# Patient Record
Sex: Female | Born: 1940
Health system: Southern US, Community
[De-identification: ages and names within clinical notes are randomized; demographics above are authoritative.]

## PROBLEM LIST (undated history)

## (undated) DIAGNOSIS — F419 Anxiety disorder, unspecified: Secondary | ICD-10-CM

## (undated) DIAGNOSIS — M1990 Unspecified osteoarthritis, unspecified site: Secondary | ICD-10-CM

## (undated) DIAGNOSIS — H3530 Unspecified macular degeneration: Secondary | ICD-10-CM

## (undated) DIAGNOSIS — J302 Other seasonal allergic rhinitis: Secondary | ICD-10-CM

## (undated) DIAGNOSIS — E079 Disorder of thyroid, unspecified: Secondary | ICD-10-CM

## (undated) DIAGNOSIS — N951 Menopausal and female climacteric states: Secondary | ICD-10-CM

## (undated) DIAGNOSIS — K529 Noninfective gastroenteritis and colitis, unspecified: Secondary | ICD-10-CM

## (undated) DIAGNOSIS — I10 Essential (primary) hypertension: Secondary | ICD-10-CM

## (undated) DIAGNOSIS — N979 Female infertility, unspecified: Secondary | ICD-10-CM

## (undated) DIAGNOSIS — L719 Rosacea, unspecified: Secondary | ICD-10-CM

## (undated) DIAGNOSIS — K469 Unspecified abdominal hernia without obstruction or gangrene: Secondary | ICD-10-CM

## (undated) HISTORY — DX: Disorder of thyroid, unspecified: E07.9

## (undated) HISTORY — DX: Unspecified abdominal hernia without obstruction or gangrene: K46.9

## (undated) HISTORY — DX: Rosacea, unspecified: L71.9

## (undated) HISTORY — DX: Menopausal and female climacteric states: N95.1

## (undated) HISTORY — PX: INGUINAL HERNIA REPAIR: SUR1180

## (undated) HISTORY — DX: Unspecified macular degeneration: H35.30

## (undated) HISTORY — PX: OTHER SURGICAL HISTORY: SHX169

## (undated) HISTORY — DX: Noninfective gastroenteritis and colitis, unspecified: K52.9

## (undated) HISTORY — DX: Female infertility, unspecified: N97.9

## (undated) HISTORY — PX: COLONOSCOPY: SHX174

---

## 1966-04-27 DIAGNOSIS — E079 Disorder of thyroid, unspecified: Secondary | ICD-10-CM

## 1966-04-27 HISTORY — PX: WISDOM TOOTH EXTRACTION: SHX21

## 1966-04-27 HISTORY — DX: Disorder of thyroid, unspecified: E07.9

## 1998-09-03 ENCOUNTER — Other Ambulatory Visit: Admission: RE | Admit: 1998-09-03 | Discharge: 1998-09-03 | Payer: Self-pay | Admitting: Gastroenterology

## 1998-09-26 ENCOUNTER — Observation Stay (HOSPITAL_COMMUNITY): Admission: RE | Admit: 1998-09-26 | Discharge: 1998-09-27 | Payer: Self-pay

## 1998-09-26 HISTORY — PX: LAPAROSCOPIC CHOLECYSTECTOMY: SUR755

## 1999-02-18 ENCOUNTER — Other Ambulatory Visit: Admission: RE | Admit: 1999-02-18 | Discharge: 1999-02-18 | Payer: Self-pay | Admitting: Obstetrics and Gynecology

## 2000-08-10 ENCOUNTER — Other Ambulatory Visit: Admission: RE | Admit: 2000-08-10 | Discharge: 2000-08-10 | Payer: Self-pay | Admitting: Obstetrics and Gynecology

## 2002-03-17 ENCOUNTER — Other Ambulatory Visit: Admission: RE | Admit: 2002-03-17 | Discharge: 2002-03-17 | Payer: Self-pay | Admitting: Obstetrics and Gynecology

## 2003-04-28 HISTORY — PX: COLONOSCOPY: SHX174

## 2003-07-24 ENCOUNTER — Other Ambulatory Visit: Admission: RE | Admit: 2003-07-24 | Discharge: 2003-07-24 | Payer: Self-pay | Admitting: Obstetrics and Gynecology

## 2004-03-21 ENCOUNTER — Ambulatory Visit: Payer: Self-pay | Admitting: Gastroenterology

## 2004-04-08 ENCOUNTER — Ambulatory Visit: Payer: Self-pay | Admitting: Gastroenterology

## 2004-10-08 ENCOUNTER — Other Ambulatory Visit: Admission: RE | Admit: 2004-10-08 | Discharge: 2004-10-08 | Payer: Self-pay | Admitting: Obstetrics and Gynecology

## 2004-12-09 ENCOUNTER — Ambulatory Visit: Payer: Self-pay | Admitting: Family Medicine

## 2005-02-02 ENCOUNTER — Ambulatory Visit: Payer: Self-pay | Admitting: Family Medicine

## 2005-02-09 ENCOUNTER — Ambulatory Visit: Payer: Self-pay | Admitting: Family Medicine

## 2005-06-24 ENCOUNTER — Ambulatory Visit: Payer: Self-pay | Admitting: Internal Medicine

## 2005-07-21 ENCOUNTER — Ambulatory Visit: Payer: Self-pay

## 2005-07-23 ENCOUNTER — Ambulatory Visit: Payer: Self-pay | Admitting: Cardiology

## 2006-03-05 ENCOUNTER — Other Ambulatory Visit: Admission: RE | Admit: 2006-03-05 | Discharge: 2006-03-05 | Payer: Self-pay | Admitting: Obstetrics & Gynecology

## 2006-08-06 ENCOUNTER — Ambulatory Visit: Payer: Self-pay | Admitting: Family Medicine

## 2006-08-06 LAB — CONVERTED CEMR LAB
ALT: 17 units/L (ref 0–40)
AST: 24 units/L (ref 0–37)
Albumin: 4.1 g/dL (ref 3.5–5.2)
Alkaline Phosphatase: 61 units/L (ref 39–117)
BUN: 12 mg/dL (ref 6–23)
Basophils Absolute: 0 10*3/uL (ref 0.0–0.1)
Basophils Relative: 0.7 % (ref 0.0–1.0)
Bilirubin, Direct: 0.2 mg/dL (ref 0.0–0.3)
CO2: 30 meq/L (ref 19–32)
Calcium: 9.8 mg/dL (ref 8.4–10.5)
Chloride: 109 meq/L (ref 96–112)
Cholesterol: 259 mg/dL (ref 0–200)
Creatinine, Ser: 0.9 mg/dL (ref 0.4–1.2)
Direct LDL: 181 mg/dL
Eosinophils Absolute: 0.2 10*3/uL (ref 0.0–0.6)
Eosinophils Relative: 3 % (ref 0.0–5.0)
GFR calc Af Amer: 81 mL/min
GFR calc non Af Amer: 67 mL/min
Glucose, Bld: 79 mg/dL (ref 70–99)
HCT: 39.5 % (ref 36.0–46.0)
HDL: 47.8 mg/dL (ref >39.0)
Hemoglobin: 13.5 g/dL (ref 12.0–15.0)
Lymphocytes Relative: 29.9 % (ref 12.0–46.0)
MCHC: 34.3 g/dL (ref 30.0–36.0)
MCV: 91.5 fL (ref 78.0–100.0)
Monocytes Absolute: 0.5 10*3/uL (ref 0.2–0.7)
Monocytes Relative: 6.4 % (ref 3.0–11.0)
Neutro Abs: 4.3 10*3/uL (ref 1.4–7.7)
Neutrophils Relative %: 60 % (ref 43.0–77.0)
Platelets: 298 10*3/uL (ref 150–400)
Potassium: 4.4 meq/L (ref 3.5–5.1)
RBC: 4.32 M/uL (ref 3.87–5.11)
RDW: 12.1 % (ref 11.5–14.6)
Sodium: 145 meq/L (ref 135–145)
TSH: 1.89 microintl units/mL (ref 0.35–5.50)
Total Bilirubin: 1.1 mg/dL (ref 0.3–1.2)
Total CHOL/HDL Ratio: 5.4
Total Protein: 7.5 g/dL (ref 6.0–8.3)
Triglycerides: 163 mg/dL — ABNORMAL HIGH (ref 0–149)
VLDL: 33 mg/dL (ref 0–40)
WBC: 7.1 10*3/uL (ref 4.5–10.5)

## 2007-01-19 DIAGNOSIS — J309 Allergic rhinitis, unspecified: Secondary | ICD-10-CM | POA: Insufficient documentation

## 2007-05-24 ENCOUNTER — Other Ambulatory Visit: Admission: RE | Admit: 2007-05-24 | Discharge: 2007-05-24 | Payer: Self-pay | Admitting: Obstetrics & Gynecology

## 2007-09-06 ENCOUNTER — Telehealth (INDEPENDENT_AMBULATORY_CARE_PROVIDER_SITE_OTHER): Payer: Self-pay | Admitting: *Deleted

## 2008-04-23 ENCOUNTER — Telehealth: Payer: Self-pay | Admitting: Family Medicine

## 2009-09-25 LAB — HM DEXA SCAN

## 2009-10-14 LAB — HM PAP SMEAR: HM Pap smear: NEGATIVE

## 2009-10-17 ENCOUNTER — Encounter: Payer: Self-pay | Admitting: Family Medicine

## 2009-10-22 ENCOUNTER — Encounter: Payer: Self-pay | Admitting: *Deleted

## 2010-05-29 NOTE — Miscellaneous (Signed)
Summary: bone density    Clinical Lists Changes  Observations: Added new observation of DEXANXTDUE: 10/2014 (10/22/2009 14:44) Added new observation of BONE DENSITY: normal (10/17/2009 14:45)      Preventive Care Screening  Bone Density:    Date:  10/17/2009    Next Due:  10/2014    Results:  normal

## 2010-08-08 ENCOUNTER — Telehealth: Payer: Self-pay | Admitting: Family Medicine

## 2010-08-08 NOTE — Telephone Encounter (Signed)
Pt called and is req to sch emp. Pt says that she had a pap done last fall 2011 by gynecologist and they also did complete blood panel, which should have been sent to Dr Tawanna Cooler for review. Pt is wondering if she still needs to come in for fasting emp? Pls advise.

## 2010-08-11 NOTE — Telephone Encounter (Signed)
Recommend annual physical examination

## 2010-08-13 NOTE — Telephone Encounter (Signed)
Left message on machine for patient

## 2011-11-26 HISTORY — PX: COLONOSCOPY: SHX174

## 2011-11-30 ENCOUNTER — Other Ambulatory Visit: Payer: Self-pay | Admitting: Gastroenterology

## 2012-10-31 ENCOUNTER — Ambulatory Visit (INDEPENDENT_AMBULATORY_CARE_PROVIDER_SITE_OTHER): Payer: 59 | Admitting: Neurology

## 2012-10-31 ENCOUNTER — Ambulatory Visit (INDEPENDENT_AMBULATORY_CARE_PROVIDER_SITE_OTHER): Payer: 59

## 2012-10-31 DIAGNOSIS — R209 Unspecified disturbances of skin sensation: Secondary | ICD-10-CM

## 2012-10-31 DIAGNOSIS — Z0289 Encounter for other administrative examinations: Secondary | ICD-10-CM

## 2012-10-31 NOTE — Procedures (Signed)
  HISTORY:  Cheryl Pearson is a 72 year old patient with a 2-1/2 year history of relatively sudden onset painless right hand numbness and weakness. Over 6 months, the patient regained most of her strength back with the right hand, but the numbness has been somewhat more persistent. The patient is having some discomfort at this point with use of the hand. The patient is sent for an evaluation of a possible neuropathy or a cervical radiculopathy.  NERVE CONDUCTION STUDIES:  Nerve conduction studies were performed on both upper extremities. The distal motor latencies and motor amplitudes for the median and ulnar nerves were within normal limits. The F wave latencies and nerve conduction velocities for these nerves were also normal. The sensory latencies for the median and ulnar nerves were normal.   EMG STUDIES:  EMG study was performed on the right upper extremity:  The first dorsal interosseous muscle reveals 2 to 4 K units with full recruitment. No fibrillations or positive waves were noted. The abductor pollicis brevis muscle reveals 2 to 4 K units with full recruitment. No fibrillations or positive waves were noted. The extensor indicis proprius muscle reveals 1 to 3 K units with full recruitment. No fibrillations or positive waves were noted. The pronator teres muscle reveals 2 to 3 K units with full recruitment. No fibrillations or positive waves were noted. The biceps muscle reveals 1 to 2 K units with full recruitment. No fibrillations or positive waves were noted. The triceps muscle reveals 2 to 4 K units with full recruitment. No fibrillations or positive waves were noted. The anterior deltoid muscle reveals 2 to 3 K units with full recruitment. No fibrillations or positive waves were noted. The cervical paraspinal muscles were tested at 2 levels. No abnormalities of insertional activity were seen at either level tested. There was good relaxation.   IMPRESSION:  Nerve conduction studies  done on both upper extremities were within normal limits. No evidence of carpal tunnel syndrome or evidence of an ulnar neuropathy is seen on either side. EMG evaluation of the right upper extremity was normal. There is no evidence of a cervical radiculopathy on this evaluation.  Marlan Palau MD 10/31/2012 12:51 PM  [facility] [address] [place], [place] [address]  Phone [phone] Fax [phone]

## 2012-11-18 ENCOUNTER — Encounter: Payer: Self-pay | Admitting: *Deleted

## 2012-11-22 ENCOUNTER — Encounter: Payer: Self-pay | Admitting: Nurse Practitioner

## 2012-11-22 ENCOUNTER — Ambulatory Visit (INDEPENDENT_AMBULATORY_CARE_PROVIDER_SITE_OTHER): Payer: 59 | Admitting: Nurse Practitioner

## 2012-11-22 VITALS — BP 134/62 | HR 74 | Resp 14 | Ht 65.25 in | Wt 155.8 lb

## 2012-11-22 DIAGNOSIS — E559 Vitamin D deficiency, unspecified: Secondary | ICD-10-CM

## 2012-11-22 DIAGNOSIS — Z01419 Encounter for gynecological examination (general) (routine) without abnormal findings: Secondary | ICD-10-CM

## 2012-11-22 MED ORDER — ESTROGENS, CONJUGATED 0.625 MG/GM VA CREA: TOPICAL_CREAM | VAGINAL | Status: DC

## 2012-11-22 NOTE — Progress Notes (Signed)
72 y.o. G0P0 Married Caucasian Fe here for annual exam.  Off  Estring Ring this past year secondary to cost.  Still with vagina dryness and would like to try estrogen cream.  No LMP recorded. Patient is postmenopausal.          Sexually active: yes  The current method of family planning is post menopausal status.    Exercising: yes  classes at the [facility] Smoker:  no  Health Maintenance: Pap:  10/14/2009  negative MMG:  12/09/2011 normal Colonoscopy:  11/2011 polyp recheck in 5 years BMD:   09/25/2009  T Score:  Spine: 1.4/ left femoral neck -0.4/ left total -0.2 TDaP:  01/2003- PCP maintains tetanus Shingles has been given ? date Labs: PCP maintains all labs and urine.    reports that she has never smoked. She has never used smokeless tobacco. She reports that she drinks about 0.5 ounces of alcohol per week. She reports that she does not use illicit drugs.  Past Medical History  Diagnosis Date  . Menopausal symptoms   . Infertility, female   . Thyroid disease     hypothyroidism  . Rosacea   . Early age-related macular degeneration   . Hernia   . Colitis     Past Surgical History  Procedure Laterality Date  . Colonoscopy      Current Outpatient Prescriptions  Medication Sig Dispense Refill  . ezetimibe (ZETIA) 10 MG tablet Take 10 mg by mouth daily.      . Lutein 6 MG CAPS Take by mouth daily.      Marland Kitchen tetrahydrozoline-zinc (EYE DROPS AR) 0.05-0.25 % ophthalmic solution 2 drops 3 (three) times daily as needed.      . Vitamin D, Ergocalciferol, (DRISDOL) 50000 UNITS CAPS Take 50,000 Units by mouth every 7 (seven) days.       No current facility-administered medications for this visit.    Family History  Problem Relation Age of Onset  . Cancer Mother     uterine cancer  . Cancer Maternal Aunt     colon cancer    ROS:  Pertinent items are noted in HPI.  Otherwise, a comprehensive ROS was negative.  Exam:   BP 134/62  Pulse 74  Resp 14  Ht 5' 5.25" (1.657 m)  Wt 155 lb  12.8 oz (70.67 kg)  BMI 25.74 kg/m2 Height: 5' 5.25" (165.7 cm)  Ht Readings from Last 3 Encounters:  11/22/12 5' 5.25" (1.657 m)    General appearance: alert, cooperative and appears stated age Head: Normocephalic, without obvious abnormality, atraumatic Neck: no adenopathy, supple, symmetrical, trachea midline and thyroid normal to inspection and palpation Lungs: clear to auscultation bilaterally Breasts: normal appearance, no masses or tenderness Heart: regular rate and rhythm Abdomen: soft, non-tender; no masses,  no organomegaly Extremities: extremities normal, atraumatic, no cyanosis or edema Skin: Skin color, texture, turgor normal. No rashes or lesions Lymph nodes: Cervical, supraclavicular, and axillary nodes normal. No abnormal inguinal nodes palpated Neurologic: Grossly normal   Pelvic: External genitalia:  no lesions              Urethra:  prolapse appearing urethra with no masses, tenderness or lesions              Bartholin's and Skene's: normal                 Vagina: atrophic appearing vagina with pale color and no discharge, no lesions  Cervix: anteverted              Pap taken: no Bimanual Exam:  Uterus:  normal size, contour, position, consistency, mobility, non-tender              Adnexa: no mass, fullness, tenderness               Rectovaginal: Confirms               Anus:  normal sphincter tone, no lesions  A:  Well Woman with normal exam  Postmenopausal off HRT 06/2004  Atrophic Vaginitis off Estring X 1 year secondary to cost  P:   Pap smear as per guidelines   Mammogram due 11/2012  Trial of Premarin vaginal cream 0.5 mg twice weekly and apply 'pea' size to the urethra. RX to pharmacy  Counseled with risk of ERT with DVT, CVA, cancer, etc. return annually or prn  An After Visit Summary was printed and given to the patient.

## 2012-11-22 NOTE — Patient Instructions (Addendum)

## 2012-11-23 LAB — VITAMIN D 25 HYDROXY (VIT D DEFICIENCY, FRACTURES): Vit D, 25-Hydroxy: 46 ng/mL (ref 30–89)

## 2012-11-23 NOTE — Progress Notes (Signed)
Encounter reviewed by Dr. Brook Silva.  

## 2012-11-24 ENCOUNTER — Ambulatory Visit (INDEPENDENT_AMBULATORY_CARE_PROVIDER_SITE_OTHER): Payer: 59 | Admitting: Family Medicine

## 2012-11-24 ENCOUNTER — Encounter: Payer: Self-pay | Admitting: Family Medicine

## 2012-11-24 DIAGNOSIS — T6391XA Toxic effect of contact with unspecified venomous animal, accidental (unintentional), initial encounter: Secondary | ICD-10-CM

## 2012-11-24 MED ORDER — PREDNISONE 20 MG PO TABS: ORAL_TABLET | ORAL | Status: DC

## 2012-11-24 MED ORDER — EPINEPHRINE 0.3 MG/0.3ML IJ SOAJ: 0.3000 mg | Freq: Once | INTRAMUSCULAR | Status: DC

## 2012-11-24 NOTE — Progress Notes (Signed)
[address]   [place], [place]  [address]   [phone]  Subjective:    Patient ID: Cheryl Pearson, female    DOB: [birth year], 72 y.o.   MRN: [id number]  HPI This 72 y.o. female presents for evaluation of bee stings one week ago.  Stepped on yellow jacket nest one week ago.  Multiple stings.  Improved initially with Benadryl and hydrocortisone.  Now worsening with increasing bumps, redness.  Anterior neck hurt and itch; the others really itch at night.  Diffuse.  No facial swelling, tongue swelling, SOB.   No fever/chills/sweats.  No malaise or fatigue.   Review of Systems  Constitutional: Negative for fever, chills, diaphoresis and fatigue.  HENT: Negative for facial swelling.   Respiratory: Negative for cough and shortness of breath.   Gastrointestinal: Negative for nausea and vomiting.  Skin: Positive for color change and rash. Negative for pallor and wound.   Past Medical History  Diagnosis Date  . Menopausal symptoms   . Infertility, female   . Rosacea   . Early age-related macular degeneration   . Hernia   . Colitis   . Thyroid disease 1968    hypothyroidism treated with meds for short time then all labs normal   Current Outpatient Prescriptions on File Prior to Visit  Medication Sig Dispense Refill  . cholecalciferol (VITAMIN D) 1000 UNITS tablet Take 1,000 Units by mouth daily. Taking 2,000 IU daily      . conjugated estrogens (PREMARIN) vaginal cream Place vaginally 2 (two) times a week.  42.5 g  12  . ezetimibe (ZETIA) 10 MG tablet Take 10 mg by mouth daily.      . Lutein 6 MG CAPS Take by mouth daily.      Marland Kitchen tetrahydrozoline-zinc (EYE DROPS AR) 0.05-0.25 % ophthalmic solution 2 drops 3 (three) times daily as needed.       No current facility-administered medications on file prior to visit.   History   Social History  . Marital Status: Married    Spouse Name: N/A    Number of Children: N/A  . Years of Education: N/A   Occupational History  . Not on file.    Social History Main Topics  . Smoking status: Never Smoker   . Smokeless tobacco: Never Used  . Alcohol Use: 0.5 oz/week    1 drink(s) per week  . Drug Use: No  . Sexually Active: Yes    Birth Control/ Protection: Post-menopausal   Other Topics Concern  . Not on file   Social History Narrative  . No narrative on file       Objective:   Physical Exam  Nursing note and vitals reviewed. Constitutional: She is oriented to person, place, and time. She appears well-developed and well-nourished. No distress.  HENT:  Head: Normocephalic and atraumatic.  Mouth/Throat: Oropharynx is clear and moist.  Neck: Normal range of motion. Neck supple.  Cardiovascular: Normal rate, regular rhythm and normal heart sounds.   Pulmonary/Chest: Effort normal and breath sounds normal.  Lymphadenopathy:    She has no cervical adenopathy.  Neurological: She is alert and oriented to person, place, and time.  Skin: She is not diaphoretic.  SCATTERED INSECT BITES WITH AREA OF CLEARING SURROUNDED BY ERYTHEMA; NO INDURATION; NO VESICLES OR PUSTULES.  BITES SCATTERED ALONG TORSO, NECK, EXTREMITIES THROUGHOUT.  Psychiatric: She has a normal mood and affect. Her behavior is normal.       Assessment & Plan:  Bee sting reaction, initial encounter  1. Bee sting allergic reaction initial:  New.  Rx for Prednisone, epi pen provided.  Recommend Zyrtec 10mg  daily, Benadryl 25mg  qhs, Zyrtec 10mg  qhs, Ranitidine 150mg  bid.  RTC immediately for fever/chills/sweats, SOB, facial swelling.  Tetanus UTD per patient  Meds ordered this encounter  Medications  . diphenhydrAMINE (SOMINEX) 25 MG tablet    Sig: Take 25 mg by mouth at bedtime as needed for sleep.  . hydrocortisone cream 0.5 %    Sig: Apply topically 2 (two) times daily.  . predniSONE (DELTASONE) 20 MG tablet    Sig: 3 tablets daily x 1 day then two tablets daily x 5 days then one tablet daily x 5 days    Dispense:  18 tablet    Refill:  0  .  EPINEPHrine (EPIPEN 2-PAK) 0.3 mg/0.3 mL SOAJ    Sig: Inject 0.3 mLs (0.3 mg total) into the muscle once.    Dispense:  1 Device    Refill:  1

## 2012-11-24 NOTE — Patient Instructions (Addendum)
1.  CLARITIN OR ZYRTEC 10MG  ONE TABLET DAILY. 2.  BENADRYL 25MG  ONE AT BEDTIME. 3.  ZANTAC 150MG  ONE TABLET TWICE DAILY. 4.  TAKE PREDNISONE AS PRESCRIBED.

## 2012-11-25 ENCOUNTER — Telehealth: Payer: Self-pay | Admitting: *Deleted

## 2012-11-25 NOTE — Telephone Encounter (Signed)
LVM for pt to return my call in regards to lab results.  

## 2012-11-25 NOTE — Telephone Encounter (Signed)
Message copied by Osie Bond on Fri Nov 25, 2012 10:01 AM ------      Message from: Ria Comment R      Created: Thu Nov 24, 2012  5:13 PM       Vit D is normal follow protocol. ------

## 2012-11-28 NOTE — Telephone Encounter (Signed)
Patient notified of Vitamin D results and protocol to follow of OTC vitamin D 600- 800 IU daily. Patient understands directions and glad her level has increased.

## 2012-11-28 NOTE — Telephone Encounter (Signed)
Patient returning phone call .Maybe In regards to  Blood test results.

## 2013-03-02 ENCOUNTER — Other Ambulatory Visit: Payer: Self-pay

## 2013-10-13 ENCOUNTER — Other Ambulatory Visit: Payer: Self-pay | Admitting: Geriatric Medicine

## 2013-10-13 DIAGNOSIS — R1310 Dysphagia, unspecified: Secondary | ICD-10-CM

## 2013-10-18 ENCOUNTER — Ambulatory Visit
Admission: RE | Admit: 2013-10-18 | Discharge: 2013-10-18 | Disposition: A | Discharge status: Home / Self Care | Payer: Medicare Other | Source: Ambulatory Visit | Attending: Geriatric Medicine | Admitting: Geriatric Medicine

## 2013-10-18 DIAGNOSIS — R1310 Dysphagia, unspecified: Secondary | ICD-10-CM

## 2014-03-14 ENCOUNTER — Encounter: Payer: Self-pay | Admitting: *Deleted

## 2014-10-04 ENCOUNTER — Telehealth: Payer: Self-pay | Admitting: Nurse Practitioner

## 2014-10-04 NOTE — Telephone Encounter (Signed)
Patient states she needs records on bone density test and mammogram. Patient ok for call back

## 2014-10-05 NOTE — Telephone Encounter (Signed)
Message left to return call to Olinda at [phone].   Records in hard copy chart, Last Dexa 10/17/09 at [place]. Last Mammogram 11/29/11 at [facility]. Patient will need to come to office and sign release for copies at front desk.

## 2014-10-05 NOTE — Telephone Encounter (Signed)
I think we can hold this until Monday for Patty

## 2014-10-05 NOTE — Telephone Encounter (Signed)
Debbi,   Alexia Freestone was the ordering provider for the hard copy we have here with cc to Dr. Tawanna Cooler

## 2014-10-05 NOTE — Telephone Encounter (Signed)
The scanned record shows to Dr Tawanna Cooler did he order the last one? If so he should order??

## 2014-10-05 NOTE — Telephone Encounter (Signed)
Call to patient and advised of last dates of exam per patient request.  Patient requests order for Bone Density Testing.  Last Bone Density at [facility] 10/17/09.  Last annual exam with Lauro Franklin, FNP 10/2012.  Patient declines to schedule annual exam at this time, she would like to get screenings done if possible to do both together and then return call to scheduled. Advised I would ask provider if she would be willing to place order. Patient would like a message on her phone to advise.

## 2014-10-09 NOTE — Telephone Encounter (Signed)
Yes she can have order for BMD

## 2014-10-09 NOTE — Telephone Encounter (Signed)
Cheryl Pearson can you review?

## 2014-10-10 NOTE — Telephone Encounter (Signed)
Order faxed to [facility].  Patient notified. Advised to call to schedule. Patient agreeable.   Routing to provider for final review. Patient agreeable to disposition. Will close encounter.

## 2014-10-26 ENCOUNTER — Ambulatory Visit
Admission: RE | Admit: 2014-10-26 | Discharge: 2014-10-26 | Disposition: A | Discharge status: Home / Self Care | Payer: Medicare Other | Source: Ambulatory Visit | Attending: Geriatric Medicine | Admitting: Geriatric Medicine

## 2014-10-26 ENCOUNTER — Other Ambulatory Visit: Payer: Self-pay | Admitting: Geriatric Medicine

## 2014-10-26 DIAGNOSIS — R05 Cough: Secondary | ICD-10-CM

## 2014-10-26 DIAGNOSIS — R059 Cough, unspecified: Secondary | ICD-10-CM

## 2014-10-26 DIAGNOSIS — M25552 Pain in left hip: Secondary | ICD-10-CM

## 2014-10-26 DIAGNOSIS — M545 Low back pain: Secondary | ICD-10-CM

## 2014-10-30 ENCOUNTER — Telehealth: Payer: Self-pay | Admitting: Nurse Practitioner

## 2014-10-30 NOTE — Telephone Encounter (Addendum)
Please let pt. know that BMD done on 10/19/14 shows a  T Score: spine 1.6; left hip neck -0.7; right hip neck -0.9.   The right hip result fall into the low normal range.  The FRAX score for major fracture in 10 years is 13.9% (normal <20%); FRAX score for hip fracture in 10 years is 4.2% (normal <3%).  The  FRAX score looks higher than expected [facility].  She still needs to continue calcium, Vit D and exercise.

## 2014-11-01 NOTE — Telephone Encounter (Signed)
Left message to call Shayda Kalka at [phone]. 

## 2014-11-05 NOTE — Telephone Encounter (Signed)
Left message to call Cheryl Pearson at [phone]. 

## 2014-11-06 NOTE — Telephone Encounter (Signed)
Spoke with patient. Advised of results as seen below. Patient is agreeable and verbalizes understanding.  Routing to provider for final review. Patient agreeable to disposition. Will close encounter.   Patient aware provider will review message and nurse will return call if any additional advice or change of disposition.

## 2014-11-14 ENCOUNTER — Encounter (HOSPITAL_COMMUNITY): Payer: Self-pay | Admitting: *Deleted

## 2014-11-19 ENCOUNTER — Other Ambulatory Visit: Payer: Self-pay | Admitting: Gastroenterology

## 2014-11-19 NOTE — Addendum Note (Signed)
Addended by: Samuel Germany. on: 11/19/2014 11:13 AM   Modules accepted: Orders

## 2014-11-20 ENCOUNTER — Ambulatory Visit (HOSPITAL_COMMUNITY): Payer: Medicare Other | Admitting: Anesthesiology

## 2014-11-20 ENCOUNTER — Encounter (HOSPITAL_COMMUNITY): Payer: Self-pay | Admitting: *Deleted

## 2014-11-20 ENCOUNTER — Ambulatory Visit (HOSPITAL_COMMUNITY): Payer: Medicare Other

## 2014-11-20 ENCOUNTER — Ambulatory Visit (HOSPITAL_COMMUNITY)
Admission: RE | Admit: 2014-11-20 | Discharge: 2014-11-20 | Disposition: A | Discharge status: Home / Self Care | Payer: Medicare Other | Source: Ambulatory Visit | Attending: Gastroenterology | Admitting: Gastroenterology

## 2014-11-20 ENCOUNTER — Encounter (HOSPITAL_COMMUNITY)
Admission: RE | Disposition: A | Discharge status: Home / Self Care | Payer: Self-pay | Source: Ambulatory Visit | Attending: Gastroenterology

## 2014-11-20 DIAGNOSIS — Z791 Long term (current) use of non-steroidal anti-inflammatories (NSAID): Secondary | ICD-10-CM | POA: Diagnosis not present

## 2014-11-20 DIAGNOSIS — Z9049 Acquired absence of other specified parts of digestive tract: Secondary | ICD-10-CM | POA: Diagnosis not present

## 2014-11-20 DIAGNOSIS — H3530 Unspecified macular degeneration: Secondary | ICD-10-CM | POA: Insufficient documentation

## 2014-11-20 DIAGNOSIS — K219 Gastro-esophageal reflux disease without esophagitis: Secondary | ICD-10-CM | POA: Insufficient documentation

## 2014-11-20 DIAGNOSIS — Z79899 Other long term (current) drug therapy: Secondary | ICD-10-CM | POA: Insufficient documentation

## 2014-11-20 DIAGNOSIS — K222 Esophageal obstruction: Secondary | ICD-10-CM | POA: Insufficient documentation

## 2014-11-20 DIAGNOSIS — R1310 Dysphagia, unspecified: Secondary | ICD-10-CM | POA: Diagnosis present

## 2014-11-20 HISTORY — PX: SAVORY DILATION: SHX5439

## 2014-11-20 HISTORY — PX: ESOPHAGOGASTRODUODENOSCOPY (EGD) WITH PROPOFOL: SHX5813

## 2014-11-20 SURGERY — ESOPHAGOGASTRODUODENOSCOPY (EGD) WITH PROPOFOL
Anesthesia: Monitor Anesthesia Care

## 2014-11-20 MED ORDER — PROPOFOL INFUSION 10 MG/ML OPTIME
INTRAVENOUS | Status: DC | PRN
  Administered 2014-11-20: 140 ug/kg/min via INTRAVENOUS

## 2014-11-20 MED ORDER — LIDOCAINE HCL (CARDIAC) 20 MG/ML IV SOLN
INTRAVENOUS | Status: DC | PRN
  Administered 2014-11-20: 100 mg via INTRAVENOUS

## 2014-11-20 MED ORDER — LACTATED RINGERS IV SOLN
INTRAVENOUS | Status: DC
  Administered 2014-11-20: 1000 mL via INTRAVENOUS

## 2014-11-20 MED ORDER — PROPOFOL 10 MG/ML IV BOLUS
INTRAVENOUS | Status: AC
Start: 2014-11-20 — End: 2014-11-20
  Filled 2014-11-20: qty 20

## 2014-11-20 MED ORDER — PROPOFOL 10 MG/ML IV BOLUS
INTRAVENOUS | Status: DC | PRN
  Administered 2014-11-20 (×3): 20 mg via INTRAVENOUS

## 2014-11-20 MED ORDER — SODIUM CHLORIDE 0.9 % IV SOLN: INTRAVENOUS | Status: DC

## 2014-11-20 MED ORDER — PROPOFOL 10 MG/ML IV BOLUS
INTRAVENOUS | Status: AC
  Filled 2014-11-20: qty 20

## 2014-11-20 MED ORDER — LIDOCAINE HCL (CARDIAC) 20 MG/ML IV SOLN
INTRAVENOUS | Status: AC
  Filled 2014-11-20: qty 5

## 2014-11-20 SURGICAL SUPPLY — 14 items

## 2014-11-20 NOTE — H&P (Signed)
Subjective:   Patient is a 74 y.o. female presents with dysphagia. Barium swallow showed smooth narrowing in the proximal esophagus had the appearance of 9 straight with no associated masses. Patient has had problems swallowing pills and other solids. She's had trouble swallowing large pieces of food.. Procedure including risks and benefits discussed in office.  Patient Active Problem List   Diagnosis Date Noted  . ALLERGIC RHINITIS 01/19/2007   Past Medical History  Diagnosis Date  . Menopausal symptoms   . Infertility, female   . Rosacea   . Early age-related macular degeneration   . Hernia   . Colitis   . Thyroid disease 1968    hypothyroidism treated with meds for short time then all labs normal  . GERD (gastroesophageal reflux disease)     Past Surgical History  Procedure Laterality Date  . Colonoscopy  11/2011    polyp, ulcerative coliits inactive  . Laparoscopic cholecystectomy  09/1998  . Colonoscopy  2005  . Colonoscopy  1970's    diagnosed with ulcerative colitis  . Wisdom tooth extraction  1968  . Inguinal hernia repair Right 20 mos old    Prescriptions prior to admission  Medication Sig Dispense Refill Last Dose  . cholecalciferol (VITAMIN D) 1000 UNITS tablet Take 2,000 Units by mouth daily. Taking 2,000 IU daily   11/18/2014  . ezetimibe (ZETIA) 10 MG tablet Take 10 mg by mouth daily.   11/20/2014  . Lutein 6 MG CAPS Take 1 capsule by mouth daily.    11/18/2014  . meloxicam (MOBIC) 15 MG tablet Take 15 mg by mouth daily.   11/17/2014  . Multiple Vitamins-Minerals (PRESERVISION AREDS) CAPS Take 1 capsule by mouth daily.    Past Month at Unknown time  . conjugated estrogens (PREMARIN) vaginal cream Place vaginally 2 (two) times a week. (Patient not taking: Reported on 11/13/2014) 42.5 g 12 Taking  . EPINEPHrine (EPIPEN 2-PAK) 0.3 mg/0.3 mL SOAJ Inject 0.3 mLs (0.3 mg total) into the muscle once. 1 Device 1 More than a month at Unknown time  . predniSONE (DELTASONE) 20  MG tablet 3 tablets daily x 1 day then two tablets daily x 5 days then one tablet daily x 5 days (Patient not taking: Reported on 11/13/2014) 18 tablet 0 Taking   Allergies  Allergen Reactions  . Yellow Jacket Venom [Bee Venom] Anaphylaxis  . Lipitor [Atorvastatin]     Joint pain.  . Statins     aches  . Zocor [Simvastatin]     aches  . Crestor [Rosuvastatin]     aches  . Welchol [Colesevelam Hcl]     aches    History  Substance Use Topics  . Smoking status: Never Smoker   . Smokeless tobacco: Never Used  . Alcohol Use: 0.5 oz/week    1 Standard drinks or equivalent per week     Comment: occasionaly    Family History  Problem Relation Age of Onset  . Cancer Mother     uterine cancer  . Osteoarthritis Mother   . Rheum arthritis Mother   . COPD Mother   . Cancer Maternal Aunt     colon cancer  . COPD Father   . COPD Brother      Objective:   Patient Vitals for the past 8 hrs:  BP Temp Temp src Pulse Resp SpO2 Height Weight  11/20/14 1216 (!) 176/69 mmHg 98 F (36.7 C) Oral 90 15 96 %  (1.651 m) 72.576 kg (160 lb)  See MD Preop evaluation      Assessment:   1. Dysphagia with barium swallow showing smooth proximal esophageal stricture  Plan:   Will proceed with EGD and dilatation using fluoroscopic guidance. Procedure has been discussed in detail in the office.

## 2014-11-20 NOTE — Discharge Instructions (Addendum)
Conscious Sedation, Adult, Care After Refer to this sheet in the next few weeks. These instructions provide you with information on caring for yourself after your procedure. Your health care provider may also give you more specific instructions. Your treatment has been planned according to current medical practices, but problems sometimes occur. Call your health care provider if you have any problems or questions after your procedure. WHAT TO EXPECT AFTER THE PROCEDURE  After your procedure:  You may feel sleepy, clumsy, and have poor balance for several hours.  Vomiting may occur if you eat too soon after the procedure. HOME CARE INSTRUCTIONS  Do not participate in any activities where you could become injured for at least 24 hours. Do not:  Drive.  Swim.  Ride a bicycle.  Operate heavy machinery.  Cook.  Use power tools.  Climb ladders.  Work from a high place.  Do not make important decisions or sign legal documents until you are improved.  If you vomit, drink water, juice, or soup when you can drink without vomiting. Make sure you have little or no nausea before eating solid foods.  Only take over-the-counter or prescription medicines for pain, discomfort, or fever as directed by your health care provider.  Make sure you and your family fully understand everything about the medicines given to you, including what side effects may occur.  You should not drink alcohol, take sleeping pills, or take medicines that cause drowsiness for at least 24 hours.  If you smoke, do not smoke without supervision.  If you are feeling better, you may resume normal activities 24 hours after you were sedated.  Keep all appointments with your health care provider. SEEK MEDICAL CARE IF:  Your skin is pale or bluish in color.  You continue to feel nauseous or vomit.  Your pain is getting worse and is not helped by medicine.  You have bleeding or swelling.  You are still sleepy or  feeling clumsy after 24 hours. SEEK IMMEDIATE MEDICAL CARE IF:  You develop a rash.  You have difficulty breathing.  You develop any type of allergic problem.  You have a fever. MAKE SURE YOU:  Understand these instructions.  Will watch your condition.  Will get help right away if you are not doing well or get worse. Document Released: 02/01/2013 Document Reviewed: 02/01/2013 [facility] Patient Information 2015 [place], [place]. This information is not intended to replace advice given to you by your health care provider. Make sure you discuss any questions you have with your health care provider. Continue current meds. Clear liquids for 4-6 hours, if no chest pain or trouble breathing, soft foods tonight.  Regular diet tomorrow. Call for problems.

## 2014-11-20 NOTE — Op Note (Signed)
[facility] [address] [place], [address]   ENDOSCOPY PROCEDURE REPORT  PATIENT: Cheryl, Pearson  MR#: [id number] BIRTHDATE: 1940/10/06 , 73  yrs. old GENDER: female  ENDOSCOPIST: Carman Ching, MD, MD REFFERED Denice Paradise, M.D.  PROCEDURE DATE:  22-Nov-2014 PROCEDURE:   EGD w/ wire guided (savary) dilation  INDICATIONS:1.  patient with dysphagia and barium swallow showing smooth stricture in the proximal esophagus. MEDICATIONS: Propofol 180 mg IV TOPICAL ANESTHETIC:  DESCRIPTION OF PROCEDURE:   After the risks benefits and alternatives of the procedure were thoroughly explained, informed consent was obtained.  The Pentax Gastroscope [id number]  endoscope was introduced through the mouth and advanced to the second portion of the duodenum. The instrument was slowly withdrawn as the mucosa was carefully examined.  Prior to withdrawal of the scope, the guidwire was placed.  The esophagus was dilated successfully.  The patient was recovered in endoscopy and discharged home in satisfactory condition. Estimated blood loss is zero unless otherwise noted in this procedure report.      ESOPHAGUS: There was a short stricture in the proximal esophagus. The stricture was not traversable.  The stricture was dilated using a 11mm (33Fr) savary dilator over guidewire.  The stricture was dilated using a 12mm (36Fr) savary dilator over guidewire.  The stricture was dilated using a 12.84mm (38Fr) savary dilator over guidewire.  The stricture was dilated using a 14mm (42Fr) savary dilator over guidewire.  Following this dilation, there was a small amount of heme.  STOMACH: Normal.  DUODENUM: Normal. we originally use the adult scope and then change to the pediatric endoscope in the stricture was easily passed. Dilation was then performed at the  Dilator: Savary over guidewire Size(s): 11 mm, 12 mm, 12.8 mm, 14 mm  COMPLICATIONS: There were no immediate  complications.  ENDOSCOPIC IMPRESSION: 1.   There was a short stricture in the proximal esophagus; The stricture was dilated using a 11mm (33Fr) savary dilator over guidewire.; The stricture was dilated using a 12mm (36Fr) savary dilator over guidewire.; The stricture was dilated using a 12.27mm (38Fr) savary dilator over guidewire.; The stricture was dilated using a 14mm (42Fr) savary dilator over guidewire.; Following this dilation, there was a small amount of heme 2.   Normal stomach 3.   Normal duodenum RECOMMENDATIONS: Routine post dilatation instructions follow up in the office in 3 weeks     _______________________________ eSigned:  Carman Ching, MD 22-Nov-2014 2:14 PM   CPT CODES: ICD CODES:  The ICD and CPT codes recommended by this software are interpretations from the data that the clinical staff has captured with the software.  The verification of the translation of this report to the ICD and CPT codes and modifiers is the sole responsibility of the health care institution and practicing physician where this report was generated.  [employer] will not be held responsible for the validity of the ICD and CPT codes included on this report.  AMA assumes no liability for data contained or not contained herein. CPT is a Publishing rights manager of the [employer].    PATIENT NAME:  Cheryl, Pearson MR#: [id number]

## 2014-11-20 NOTE — Anesthesia Preprocedure Evaluation (Addendum)
Anesthesia Evaluation  Patient identified by MRN, date of birth, ID band Patient awake    Reviewed: Allergy & Precautions, NPO status , Patient's Chart, lab work & pertinent test results  History of Anesthesia Complications Negative for: history of anesthetic complications  Airway Mallampati: II  TM Distance: >3 FB Neck ROM: Full    Dental  (+) Caps, Implants, Dental Advisory Given   Pulmonary neg pulmonary ROS,  breath sounds clear to auscultation        Cardiovascular negative cardio ROS  Rhythm:Regular Rate:Normal     Neuro/Psych negative neurological ROS     GI/Hepatic Neg liver ROS, GERD-  Medicated and Controlled,Esophageal stricture   Endo/Other  negative endocrine ROS  Renal/GU negative Renal ROS     Musculoskeletal   Abdominal   Peds  Hematology negative hematology ROS (+)   Anesthesia Other Findings   Reproductive/Obstetrics                            Anesthesia Physical Anesthesia Plan  ASA: II  Anesthesia Plan: MAC   Post-op Pain Management:    Induction:   Airway Management Planned: Natural Airway and Nasal Cannula  Additional Equipment:   Intra-op Plan:   Post-operative Plan:   Informed Consent: I have reviewed the patients History and Physical, chart, labs and discussed the procedure including the risks, benefits and alternatives for the proposed anesthesia with the patient or authorized representative who has indicated his/her understanding and acceptance.   Dental advisory given  Plan Discussed with: CRNA and Surgeon  Anesthesia Plan Comments: (Plan routine monitors, MAC)        Anesthesia Quick Evaluation

## 2014-11-20 NOTE — Anesthesia Postprocedure Evaluation (Signed)
  Anesthesia Post-op Note  Patient: Cheryl Pearson  Procedure(s) Performed: Procedure(s): ESOPHAGOGASTRODUODENOSCOPY (EGD) WITH PROPOFOL (N/A) SAVORY DILATION (N/A)  Patient Location: Endoscopy Unit  Anesthesia Type:MAC  Level of Consciousness: awake, alert , oriented and patient cooperative  Airway and Oxygen Therapy: Patient Spontanous Breathing  Post-op Pain: none  Post-op Assessment: Post-op Vital signs reviewed, Patient's Cardiovascular Status Stable, Respiratory Function Stable, Patent Airway, No signs of Nausea or vomiting and Pain level controlled              Post-op Vital Signs: Reviewed and stable  Last Vitals:  Filed Vitals:   11/20/14 1414  BP: 147/62  Pulse:   Temp: 36.7 C  Resp: 18    Complications: No apparent anesthesia complications

## 2014-11-20 NOTE — Transfer of Care (Signed)
Immediate Anesthesia Transfer of Care Note  Patient: Cheryl Pearson  Procedure(s) Performed: Procedure(s): ESOPHAGOGASTRODUODENOSCOPY (EGD) WITH PROPOFOL (N/A) SAVORY DILATION (N/A)  Patient Location: Endoscopy Unit  Anesthesia Type:MAC  Level of Consciousness: awake, alert  and oriented  Airway & Oxygen Therapy: Patient Spontanous Breathing and Patient connected to nasal cannula oxygen  Post-op Assessment: Report given to RN and Post -op Vital signs reviewed and stable  Post vital signs: Reviewed and stable  Last Vitals:  Filed Vitals:   11/20/14 1216  BP: 176/69  Pulse: 90  Temp: 36.7 C  Resp: 15    Complications: No apparent anesthesia complications

## 2014-11-21 ENCOUNTER — Encounter (HOSPITAL_COMMUNITY): Payer: Self-pay | Admitting: Gastroenterology

## 2015-01-07 ENCOUNTER — Telehealth: Payer: Self-pay

## 2015-01-07 NOTE — Telephone Encounter (Signed)
Dr. Katrinka Blazing    Patient is requesting epipen for a trip where there are yellow jackets.   She is leaving on Friday.  [place].   She would like a written script to take with her in case.   [phone]

## 2015-01-08 MED ORDER — EPINEPHRINE 0.3 MG/0.3ML IJ SOAJ: 0.3000 mg | Freq: Once | INTRAMUSCULAR | Status: DC

## 2015-01-08 NOTE — Telephone Encounter (Signed)
Prescription printed. Please notify patient.

## 2015-04-19 ENCOUNTER — Ambulatory Visit (INDEPENDENT_AMBULATORY_CARE_PROVIDER_SITE_OTHER): Payer: Medicare Other | Admitting: Emergency Medicine

## 2015-04-19 VITALS — BP 148/82 | HR 83 | Temp 97.6°F | Resp 16 | Ht 65.0 in | Wt 160.0 lb

## 2015-04-19 DIAGNOSIS — M5432 Sciatica, left side: Secondary | ICD-10-CM

## 2015-04-19 MED ORDER — NAPROXEN 500 MG PO TABS: 500.0000 mg | ORAL_TABLET | Freq: Two times a day (BID) | ORAL | Status: DC

## 2015-04-19 MED ORDER — TRAMADOL HCL 50 MG PO TABS: 50.0000 mg | ORAL_TABLET | Freq: Three times a day (TID) | ORAL | Status: DC | PRN

## 2015-04-19 NOTE — Progress Notes (Addendum)
Subjective:  Patient ID: Cheryl Pearson, female    DOB: 11/01/1940  Age: 74 y.o. MRN: [id number]  CC: Hip Pain and Thigh pain   HPI Cheryl Pearson presents  with increasing since symptoms of in the left sciatic notch radiating down of the knee on the left.. Overuse. She was found to have some degenerative arthritis in the lower back particularly at L4-5 and L5-S1 in July when she had a radiograph. She had a hip x-ray at that time which was negative been having pain at least that long. She's been taking mobic with no improvement and is concerned because [place] and is concerned about taking the trip. She has no numbness tingling or in her lower extremity. She does have some weakness in her left leg on extension of the knee  History Cheryl Pearson has a past medical history of Menopausal symptoms; Infertility, female; Rosacea; Early age-related macular degeneration; Hernia; Colitis; Thyroid disease (1968); and GERD (gastroesophageal reflux disease).   She has past surgical history that includes Colonoscopy (11/2011); Laparoscopic cholecystectomy (09/1998); Colonoscopy (2005); Colonoscopy (1970's); Wisdom tooth extraction (1968); Inguinal hernia repair (Right, 2118 mos old); Esophagogastroduodenoscopy (egd) with propofol (N/A, 11/20/2014); and Savory dilation (N/A, 11/20/2014).   Her  family history includes COPD in her brother, father, and mother; Cancer in her maternal aunt and mother; Osteoarthritis in her mother; Rheum arthritis in her mother.  She   reports that she has never smoked. She has never used smokeless tobacco. She reports that she drinks about 0.5 oz of alcohol per week. She reports that she does not use illicit drugs.  Outpatient Prescriptions Prior to Visit  Medication Sig Dispense Refill  . cholecalciferol (VITAMIN D) 1000 UNITS tablet Take 2,000 Units by mouth daily. Taking 2,000 IU daily    . ezetimibe (ZETIA) 10 MG tablet Take 10 mg by mouth  daily.    . Lutein 6 MG CAPS Take 1 capsule by mouth daily.     . meloxicam (MOBIC) 15 MG tablet Take 15 mg by mouth daily.    Marland Kitchen. conjugated estrogens (PREMARIN) vaginal cream Place vaginally 2 (two) times a week. (Patient not taking: Reported on 11/13/2014) 42.5 g 12  . EPINEPHrine (EPIPEN 2-PAK) 0.3 mg/0.3 mL IJ SOAJ injection Inject 0.3 mLs (0.3 mg total) into the muscle once. (Patient not taking: Reported on 04/19/2015) 1 Device 1  . Multiple Vitamins-Minerals (PRESERVISION AREDS) CAPS Take 1 capsule by mouth daily. Reported on 04/19/2015    . predniSONE (DELTASONE) 20 MG tablet 3 tablets daily x 1 day then two tablets daily x 5 days then one tablet daily x 5 days (Patient not taking: Reported on 11/13/2014) 18 tablet 0   No facility-administered medications prior to visit.    Social History   Social History  . Marital Status: Married    Spouse Name: N/A  . Number of Children: N/A  . Years of Education: N/A   Social History Main Topics  . Smoking status: Never Smoker   . Smokeless tobacco: Never Used  . Alcohol Use: 0.5 oz/week    1 Standard drinks or equivalent per week     Comment: occasionaly  . Drug Use: No  . Sexual Activity: Yes    Birth Control/ Protection: Post-menopausal   Other Topics Concern  . None   Social History Narrative     Review of Systems  Constitutional: Negative for fever, chills and appetite change.  HENT: Negative for  congestion, ear pain, postnasal drip, sinus pressure and sore throat.   Eyes: Negative for pain and redness.  Respiratory: Negative for cough, shortness of breath and wheezing.   Cardiovascular: Negative for leg swelling.  Gastrointestinal: Negative for nausea, vomiting, abdominal pain, diarrhea, constipation and blood in stool.  Endocrine: Negative for polyuria.  Genitourinary: Negative for dysuria, urgency, frequency and flank pain.  Musculoskeletal: Positive for back pain. Negative for gait problem.  Skin: Negative for rash.    Neurological: Negative for weakness and headaches.  Psychiatric/Behavioral: Negative for confusion and decreased concentration. The patient is not nervous/anxious.     Objective:  BP 148/82 mmHg  Pulse 83  Temp(Src) 97.6 F (36.4 C) (Oral)  Resp 16  Ht RiS$  (1.651 m)  Wt 160 lb (72.576 kg)  BMI 26.63 kg/m2  SpO2 98%  Physical Exam  Constitutional: She is oriented to person, place, and time. She appears well-developed and well-nourished.  HENT:  Head: Normocephalic and atraumatic.  Eyes: Conjunctivae are normal. Pupils are equal, round, and reactive to light.  Pulmonary/Chest: Effort normal.  Musculoskeletal: She exhibits no edema.       Lumbar back: She exhibits tenderness.  She has some tenderness in the left sciatic notch  Neurological: She is alert and oriented to person, place, and time. No sensory deficit. She displays a negative Romberg sign.  He was noted to have  Skin: Skin is dry.  Psychiatric: She has a normal mood and affect. Her behavior is normal. Thought content normal.      Assessment & Plan:   Cheryl Pearson was seen today for hip pain and thigh pain.  Diagnoses and all orders for this visit:  Sciatic neuritis, left  Other orders -     traMADol (ULTRAM) 50 MG tablet; Take 1 tablet (50 mg total) by mouth every 8 (eight) hours as needed. -     naproxen (NAPROSYN) 500 MG tablet; Take 1 tablet (500 mg total) by mouth 2 (two) times daily with a meal.   given this patient's prior radiographs in her symptoms of a she's had a sciatic neuritis and I suggested we try her with medication for a week and if she's not improved will all ordered an MRI of her lumbar spine impression her husband were in agreement with plan. I was not optimistic about ability to take her trip but that's up to her depending on how her pain is with medication   I am having Cheryl Pearson start on traMADol and naproxen. I am also having her maintain her ezetimibe, Lutein, conjugated estrogens,  cholecalciferol, predniSONE, PRESERVISION AREDS, meloxicam, EPINEPHrine, and Alendronate Sodium (FOSAMAX PO).  Meds ordered this encounter  Medications  . Alendronate Sodium (FOSAMAX PO)    Sig: Take by mouth.  . traMADol (ULTRAM) 50 MG tablet    Sig: Take 1 tablet (50 mg total) by mouth every 8 (eight) hours as needed.    Dispense:  40 tablet    Refill:  0  . naproxen (NAPROSYN) 500 MG tablet    Sig: Take 1 tablet (500 mg total) by mouth 2 (two) times daily with a meal.    Dispense:  30 tablet    Refill:  0    Appropriate red flag conditions were discussed with the patient as well as actions that should be taken.  Patient expressed his understanding.  Follow-up: Return if symptoms worsen or fail to improve.  Carmelina Dane, MD

## 2015-04-19 NOTE — Patient Instructions (Signed)

## 2015-04-24 ENCOUNTER — Other Ambulatory Visit: Payer: Self-pay | Admitting: Internal Medicine

## 2015-04-24 ENCOUNTER — Ambulatory Visit
Admission: RE | Admit: 2015-04-24 | Discharge: 2015-04-24 | Disposition: A | Discharge status: Home / Self Care | Payer: Medicare Other | Source: Ambulatory Visit | Attending: Internal Medicine | Admitting: Internal Medicine

## 2015-04-24 DIAGNOSIS — M25552 Pain in left hip: Secondary | ICD-10-CM

## 2015-04-25 ENCOUNTER — Other Ambulatory Visit: Payer: Self-pay | Admitting: Internal Medicine

## 2015-04-25 DIAGNOSIS — M5416 Radiculopathy, lumbar region: Secondary | ICD-10-CM

## 2015-04-30 ENCOUNTER — Ambulatory Visit
Admission: RE | Admit: 2015-04-30 | Discharge: 2015-04-30 | Disposition: A | Discharge status: Home / Self Care | Payer: Medicare Other | Source: Ambulatory Visit | Attending: Internal Medicine | Admitting: Internal Medicine

## 2015-04-30 DIAGNOSIS — M5416 Radiculopathy, lumbar region: Secondary | ICD-10-CM

## 2015-09-13 ENCOUNTER — Emergency Department (HOSPITAL_COMMUNITY)
Admission: EM | Admit: 2015-09-13 | Discharge: 2015-09-13 | Disposition: A | Discharge status: Home / Self Care | Payer: Medicare Other | Attending: Emergency Medicine | Admitting: Emergency Medicine

## 2015-09-13 ENCOUNTER — Emergency Department (HOSPITAL_COMMUNITY): Payer: Medicare Other

## 2015-09-13 ENCOUNTER — Encounter (HOSPITAL_COMMUNITY): Payer: Self-pay | Admitting: Emergency Medicine

## 2015-09-13 DIAGNOSIS — S8010XA Contusion of unspecified lower leg, initial encounter: Secondary | ICD-10-CM

## 2015-09-13 DIAGNOSIS — S8011XA Contusion of right lower leg, initial encounter: Secondary | ICD-10-CM | POA: Diagnosis not present

## 2015-09-13 DIAGNOSIS — Z8719 Personal history of other diseases of the digestive system: Secondary | ICD-10-CM | POA: Diagnosis not present

## 2015-09-13 DIAGNOSIS — Z8639 Personal history of other endocrine, nutritional and metabolic disease: Secondary | ICD-10-CM | POA: Insufficient documentation

## 2015-09-13 DIAGNOSIS — Z8742 Personal history of other diseases of the female genital tract: Secondary | ICD-10-CM | POA: Diagnosis not present

## 2015-09-13 DIAGNOSIS — Z79899 Other long term (current) drug therapy: Secondary | ICD-10-CM | POA: Diagnosis not present

## 2015-09-13 DIAGNOSIS — Y9389 Activity, other specified: Secondary | ICD-10-CM | POA: Insufficient documentation

## 2015-09-13 DIAGNOSIS — Y998 Other external cause status: Secondary | ICD-10-CM | POA: Diagnosis not present

## 2015-09-13 DIAGNOSIS — S8012XA Contusion of left lower leg, initial encounter: Secondary | ICD-10-CM | POA: Diagnosis not present

## 2015-09-13 DIAGNOSIS — S20219A Contusion of unspecified front wall of thorax, initial encounter: Secondary | ICD-10-CM

## 2015-09-13 DIAGNOSIS — S29001A Unspecified injury of muscle and tendon of front wall of thorax, initial encounter: Secondary | ICD-10-CM | POA: Diagnosis present

## 2015-09-13 DIAGNOSIS — Y92410 Unspecified street and highway as the place of occurrence of the external cause: Secondary | ICD-10-CM | POA: Diagnosis not present

## 2015-09-13 DIAGNOSIS — Z8669 Personal history of other diseases of the nervous system and sense organs: Secondary | ICD-10-CM | POA: Insufficient documentation

## 2015-09-13 DIAGNOSIS — Z872 Personal history of diseases of the skin and subcutaneous tissue: Secondary | ICD-10-CM | POA: Insufficient documentation

## 2015-09-13 DIAGNOSIS — S20211A Contusion of right front wall of thorax, initial encounter: Secondary | ICD-10-CM | POA: Diagnosis not present

## 2015-09-13 NOTE — Discharge Instructions (Signed)
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
## 2015-09-13 NOTE — ED Notes (Signed)
Pt reports that her sciatic nerve is beginning to cause her pain

## 2015-11-28 ENCOUNTER — Encounter: Payer: Self-pay | Admitting: Nurse Practitioner

## 2016-11-25 ENCOUNTER — Encounter: Payer: Self-pay | Admitting: Obstetrics and Gynecology

## 2016-12-08 ENCOUNTER — Telehealth: Payer: Self-pay | Admitting: *Deleted

## 2016-12-08 NOTE — Telephone Encounter (Signed)
Message left to return call to Taja Pentland at [phone].    

## 2016-12-09 NOTE — Telephone Encounter (Signed)
Patient returning call.

## 2016-12-09 NOTE — Telephone Encounter (Signed)
Patient notified of results of bone density. Reviewed calcium and Vitamin D recommendations. Patient will avoid fall risks. Advised patient she may also get a call from Dr. Laverle HobbyStoneking's office.  Patient is agreeable to dispostition.   Hardcopy of report sent to scan.

## 2017-12-08 ENCOUNTER — Other Ambulatory Visit: Payer: Self-pay | Admitting: Geriatric Medicine

## 2017-12-08 ENCOUNTER — Ambulatory Visit
Admission: RE | Admit: 2017-12-08 | Discharge: 2017-12-08 | Disposition: A | Discharge status: Home / Self Care | Payer: Medicare Other | Source: Ambulatory Visit | Attending: Geriatric Medicine | Admitting: Geriatric Medicine

## 2017-12-08 DIAGNOSIS — R059 Cough, unspecified: Secondary | ICD-10-CM

## 2017-12-08 DIAGNOSIS — R05 Cough: Secondary | ICD-10-CM

## 2018-03-23 ENCOUNTER — Other Ambulatory Visit: Payer: Self-pay | Admitting: Geriatric Medicine

## 2018-03-23 DIAGNOSIS — R29898 Other symptoms and signs involving the musculoskeletal system: Secondary | ICD-10-CM

## 2018-03-27 HISTORY — PX: THORACIC FUSION: SHX1062

## 2018-04-01 ENCOUNTER — Ambulatory Visit
Admission: RE | Admit: 2018-04-01 | Discharge: 2018-04-01 | Disposition: A | Discharge status: Home / Self Care | Payer: Medicare Other | Source: Ambulatory Visit | Attending: Geriatric Medicine | Admitting: Geriatric Medicine

## 2018-04-01 DIAGNOSIS — R29898 Other symptoms and signs involving the musculoskeletal system: Secondary | ICD-10-CM

## 2018-04-12 ENCOUNTER — Other Ambulatory Visit: Payer: Self-pay | Admitting: Neurosurgery

## 2018-04-13 ENCOUNTER — Encounter (HOSPITAL_COMMUNITY): Payer: Self-pay

## 2018-04-13 NOTE — Pre-Procedure Instructions (Signed)
Cheryl Pearson  04/13/2018      [facility] - [place], [place] [place] [address] Phone: [phone] Fax: [phone]    Your procedure is scheduled on December 27th.  [facility] at 1100 A.M.  Call this number if you have problems the morning of surgery:  [phone]   Remember:  Do not eat or drink after midnight.    Take these medicines the morning of surgery with A SIP OF WATER  alendronate (FOSAMAX)  ezetimibe (ZETIA)   7 days prior to surgery STOP taking any Aspirin(unless otherwise instructed by your surgeon), Aleve, Naproxen, Ibuprofen, Motrin, Advil, Goody's, BC's, all herbal medications, fish oil, and all vitamins    Do not wear jewelry, make-up or nail polish.  Do not wear lotions, powders, or perfumes, or deodorant.  Do not shave 48 hours prior to surgery.  Men may shave face and neck.  Do not bring valuables to the hospital.  [facility] is not responsible for any belongings or valuables.  Contacts, dentures or bridgework may not be worn into surgery.  Leave your suitcase in the car.  After surgery it may be brought to your room.  For patients admitted to the hospital, discharge time will be determined by your treatment team.  Patients discharged the day of surgery will not be allowed to drive home.    [place]- Preparing For Surgery  Before surgery, you can play an important role. Because skin is not sterile, your skin needs to be as free of germs as possible. You can reduce the number of germs on your skin by washing with CHG (chlorahexidine gluconate) Soap before surgery.  CHG is an antiseptic cleaner which kills germs and bonds with the skin to continue killing germs even after washing.    Oral Hygiene is also important to reduce your risk of infection.  Remember - BRUSH YOUR TEETH THE MORNING OF SURGERY WITH YOUR REGULAR TOOTHPASTE  Please do not use if  you have an allergy to CHG or antibacterial soaps. If your skin becomes reddened/irritated stop using the CHG.  Do not shave (including legs and underarms) for at least 48 hours prior to first CHG shower. It is OK to shave your face.  Please follow these instructions carefully.   1. Shower the NIGHT BEFORE SURGERY and the MORNING OF SURGERY with CHG.   2. If you chose to wash your hair, wash your hair first as usual with your normal shampoo.  3. After you shampoo, rinse your hair and body thoroughly to remove the shampoo.  4. Use CHG as you would any other liquid soap. You can apply CHG directly to the skin and wash gently with a scrungie or a clean washcloth.   5. Apply the CHG Soap to your body ONLY FROM THE NECK DOWN.  Do not use on open wounds or open sores. Avoid contact with your eyes, ears, mouth and genitals (private parts). Wash Face and genitals (private parts)  with your normal soap.  6. Wash thoroughly, paying special attention to the area where your surgery will be performed.  7. Thoroughly rinse your body with warm water from the neck down.  8. DO NOT shower/wash with your normal soap after using and rinsing off the CHG Soap.  9. Pat yourself dry with a CLEAN TOWEL.  10. Wear CLEAN PAJAMAS to bed the night before surgery, wear comfortable clothes the  morning of surgery  11. Place CLEAN SHEETS on your bed the night of your first shower and DO NOT SLEEP WITH PETS.    Day of Surgery:  Do not apply any deodorants/lotions.  Please wear clean clothes to the hospital/surgery center.   Remember to brush your teeth WITH YOUR REGULAR TOOTHPASTE.    Please read over the following fact sheets that you were given.

## 2018-04-14 ENCOUNTER — Other Ambulatory Visit: Payer: Self-pay

## 2018-04-14 ENCOUNTER — Encounter (HOSPITAL_COMMUNITY): Payer: Self-pay

## 2018-04-14 ENCOUNTER — Other Ambulatory Visit: Payer: Self-pay | Admitting: Neurosurgery

## 2018-04-14 ENCOUNTER — Encounter (HOSPITAL_COMMUNITY)
Admission: RE | Admit: 2018-04-14 | Discharge: 2018-04-14 | Disposition: A | Discharge status: Home / Self Care | Payer: Medicare Other | Source: Ambulatory Visit | Attending: Neurosurgery | Admitting: Neurosurgery

## 2018-04-14 DIAGNOSIS — Z01818 Encounter for other preprocedural examination: Secondary | ICD-10-CM | POA: Diagnosis present

## 2018-04-14 HISTORY — DX: Anxiety disorder, unspecified: F41.9

## 2018-04-14 HISTORY — DX: Unspecified osteoarthritis, unspecified site: M19.90

## 2018-04-14 HISTORY — DX: Essential (primary) hypertension: I10

## 2018-04-14 LAB — BASIC METABOLIC PANEL
Anion gap: 10 (ref 5–15)
BUN: 14 mg/dL (ref 8–23)
CO2: 26 mmol/L (ref 22–32)
Calcium: 9.8 mg/dL (ref 8.9–10.3)
Chloride: 102 mmol/L (ref 98–111)
Creatinine, Ser: 0.57 mg/dL (ref 0.44–1.00)
GFR calc Af Amer: >60 mL/min (ref >60)
GFR calc non Af Amer: >60 mL/min (ref >60)
GLUCOSE: 86 mg/dL (ref 70–99)
Potassium: 4 mmol/L (ref 3.5–5.1)
Sodium: 138 mmol/L (ref 135–145)

## 2018-04-14 LAB — SURGICAL PCR SCREEN
MRSA, PCR: NEGATIVE
Staphylococcus aureus: NEGATIVE

## 2018-04-14 LAB — CBC
HCT: 41.6 % (ref 36.0–46.0)
Hemoglobin: 13.5 g/dL (ref 12.0–15.0)
MCH: 30.7 pg (ref 26.0–34.0)
MCHC: 32.5 g/dL (ref 30.0–36.0)
MCV: 94.5 fL (ref 80.0–100.0)
Platelets: 359 10*3/uL (ref 150–400)
RBC: 4.4 MIL/uL (ref 3.87–5.11)
RDW: 12.8 % (ref 11.5–15.5)
WBC: 7.6 10*3/uL (ref 4.0–10.5)
nRBC: 0 % (ref 0.0–0.2)

## 2018-04-14 LAB — TYPE AND SCREEN
ABO/RH(D): A POS
ANTIBODY SCREEN: NEGATIVE

## 2018-04-14 LAB — ABO/RH: ABO/RH(D): A POS

## 2018-04-14 NOTE — Progress Notes (Addendum)
PCP  Dr. Merlene LaughterHal StoneKing   No History of cardiac problems.never seen a cardiologist or had  any cardiac testing done.

## 2018-04-14 NOTE — Pre-Procedure Instructions (Signed)
Waldo LaineLila F Norris  04/14/2018      [facility] - [place] [place] [address] Phone: [phone] Fax: [phone]    Your procedure is scheduled on December 27th. Friday   [facility] at 1100 A.M.   Call this number if you have problems the morning of surgery:  [phone]   Remember:  Do not eat  Food or drink liquids after midnight.    Take these medicines the morning of surgery with A SIP OF WATER  Amlodipine(Norvasc) ezetimibe (ZETIA)   7 days prior to surgery STOP taking any Aspirin(unless otherwise instructed by your surgeon), Aleve, Naproxen, Ibuprofen, Motrin, Advil, Goody's, BC's, all herbal medications, fish oil, and all vitamins    Do not wear jewelry, make-up or nail polish.  Do not wear lotions, powders, or perfumes, or deodorant.  Do not shave 48 hours prior to surgery.  Men may shave face and neck.  Do not bring valuables to the hospital.  [facility] is not responsible for any belongings or valuables.  Contacts, dentures or bridgework may not be worn into surgery.  Leave your suitcase in the car.  After surgery it may be brought to your room.  For patients admitted to the hospital, discharge time will be determined by your treatment team.  Patients discharged the day of surgery will not be allowed to drive home.    [place]- Preparing For Surgery  Before surgery, you can play an important role. Because skin is not sterile, your skin needs to be as free of germs as possible. You can reduce the number of germs on your skin by washing with CHG (chlorahexidine gluconate) Soap before surgery.  CHG is an antiseptic cleaner which kills germs and bonds with the skin to continue killing germs even after washing.    Oral Hygiene is also important to reduce your risk of infection.  Remember - BRUSH YOUR TEETH THE MORNING OF SURGERY WITH YOUR REGULAR  TOOTHPASTE  Please do not use if you have an allergy to CHG or antibacterial soaps. If your skin becomes reddened/irritated stop using the CHG.  Do not shave (including legs and underarms) for at least 48 hours prior to first CHG shower. It is OK to shave your face.  Please follow these instructions carefully.   1. Shower the NIGHT BEFORE SURGERY and the MORNING OF SURGERY with CHG.   2. If you chose to wash your hair, wash your hair first as usual with your normal shampoo.  3. After you shampoo, rinse your hair and body thoroughly to remove the shampoo.  4. Use CHG as you would any other liquid soap. You can apply CHG directly to the skin and wash gently with a scrungie or a clean washcloth.   5. Apply the CHG Soap to your body ONLY FROM THE NECK DOWN.  Do not use on open wounds or open sores. Avoid contact with your eyes, ears, mouth and genitals (private parts). Wash Face and genitals (private parts)  with your normal soap.  6. Wash thoroughly, paying special attention to the area where your surgery will be performed.  7. Thoroughly rinse your body with warm water from the neck down.  8. DO NOT shower/wash with your normal soap after using and rinsing off the CHG Soap.  9. Pat yourself dry with a CLEAN TOWEL.  10. Wear CLEAN PAJAMAS to bed the night before surgery,  wear comfortable clothes the morning of surgery  11. Place CLEAN SHEETS on your bed the night of your first shower and DO NOT SLEEP WITH PETS.    Day of Surgery:  Do not apply any deodorants/lotions.  Please wear clean clothes to the hospital/surgery center.   Remember to brush your teeth WITH YOUR REGULAR TOOTHPASTE.    Please read over the following fact sheets that you were given.

## 2018-04-21 NOTE — H&P (Signed)
Chief Complaint   Leg weakness, gait instability  HPI   HPI: Cheryl Pearson is a 77 y.o. female with progressively worsening right-sided leg weakness and gait instability over the past several weeks. There is no particular inciting event which started her symptoms.  She says that she is at  the point where she can barely walk with a cane without falling.  She notes significant weakness of the right leg, most of the time she says she feels like she is dragging her leg.  She does not really report left leg weakness.  She has noted some mild incontinence symptoms, although she has a history of stress incontinence for many years.  Of note, she has not noted any symptoms in the upper extremities, denied numbness, tingling, weakness, or incoordination. MRI was performed revealing thoracic stenosis. She presents today for surgery. She is without any concerns.    Patient Active Problem List   Diagnosis Date Noted  . ALLERGIC RHINITIS 01/19/2007    PMH: Past Medical History:  Diagnosis Date  . Anxiety   . Arthritis   . Colitis   . Early age-related macular degeneration   . Hernia   . Hypertension   . Infertility, female   . Menopausal symptoms   . Rosacea   . Thyroid disease 1968   hypothyroidism treated with meds for short time then all labs normal    PSH: Past Surgical History:  Procedure Laterality Date  . bone spur Left   . COLONOSCOPY  11/2011   polyp, ulcerative coliits inactive  . COLONOSCOPY  2005  . COLONOSCOPY  1970's   diagnosed with ulcerative colitis  . ESOPHAGOGASTRODUODENOSCOPY (EGD) WITH PROPOFOL N/A 11/20/2014   Procedure: ESOPHAGOGASTRODUODENOSCOPY (EGD) WITH PROPOFOL;  Surgeon: Carman ChingJames Edwards, MD;  Location: [facility] ENDOSCOPY;  Service: Endoscopy;  Laterality: N/A;  . INGUINAL HERNIA REPAIR Right 5918 mos old  . LAPAROSCOPIC CHOLECYSTECTOMY  09/1998  . SAVORY DILATION N/A 11/20/2014   Procedure: SAVORY DILATION;  Surgeon: Carman ChingJames Edwards, MD;  Location: [facility] ENDOSCOPY;   Service: Endoscopy;  Laterality: N/A;  . WISDOM TOOTH EXTRACTION  1968    No medications prior to admission.    SH: Social History   Tobacco Use  . Smoking status: Never Smoker  . Smokeless tobacco: Never Used  Substance Use Topics  . Alcohol use: Yes    Alcohol/week: 1.0 standard drinks    Types: 1 Standard drinks or equivalent per week    Comment: occasionaly  . Drug use: No    MEDS: Prior to Admission medications   Medication Sig Start Date End Date Taking? Authorizing Provider  amLODipine (NORVASC) 2.5 MG tablet Take 2.5 mg by mouth daily.   Yes [provider]  ezetimibe (ZETIA) 10 MG tablet Take 10 mg by mouth daily.   Yes [provider]  ibuprofen (ADVIL,MOTRIN) 200 MG tablet Take 200 mg by mouth every 6 (six) hours as needed for headache or moderate pain.    Yes [provider]  Multiple Vitamins-Minerals (PRESERVISION AREDS 2 PO) Take 1 capsule by mouth 2 (two) times daily.   Yes [provider]  Propylene Glycol (SYSTANE BALANCE) 0.6 % SOLN Place 1-2 drops into both eyes daily as needed (for dry eyes).   Yes [provider]  EPINEPHrine (EPIPEN 2-PAK) 0.3 mg/0.3 mL IJ SOAJ injection Inject 0.3 mLs (0.3 mg total) into the muscle once. Patient not taking: Reported on 04/13/2018 01/08/15   Wallis BambergMani, Mario, PA-C    ALLERGY: Allergies  Allergen Reactions  .  Yellow Jacket Venom [Bee Venom] Anaphylaxis  . Lipitor [Atorvastatin] Other (See Comments)    Joint pain.  . Statins Other (See Comments)    aches  . Zocor [Simvastatin] Other (See Comments)    aches  . Crestor [Rosuvastatin] Other (See Comments)    aches  . Welchol [Colesevelam Hcl] Other (See Comments)    aches    Social History   Tobacco Use  . Smoking status: Never Smoker  . Smokeless tobacco: Never Used  Substance Use Topics  . Alcohol use: Yes    Alcohol/week: 1.0 standard drinks    Types: 1 Standard drinks or equivalent per week    Comment: occasionaly       Family History  Problem Relation Age of Onset  . Cancer Mother        uterine cancer  . Osteoarthritis Mother   . Rheum arthritis Mother   . COPD Mother   . COPD Father   . COPD Brother   . Cancer Maternal Aunt        colon cancer     ROS   ROS  Exam   There were no vitals filed for this visit. General appearance: WDWN, NAD Eyes: No scleral injection Cardiovascular: Regular rate and rhythm without murmurs, rubs, gallops. No edema or variciosities. Distal pulses normal. Pulmonary: Effort normal, non-labored breathing Musculoskeletal:     Muscle tone upper extremities: Normal    Muscle tone lower extremities: Normal    Motor exam: BUE 5/5, LLE 5/5, RLE 3-4/5  Neurological Mental Status:    - Patient is awake, alert, oriented to person, place, month, year, and situation    - Patient is able to give a clear and coherent history.    - No signs of aphasia or neglect Cranial Nerves    - II: Visual Fields are full. PERRL    - III/IV/VI: EOMI without ptosis or diploplia.     - V: Facial sensation is grossly normal    - VII: Facial movement is symmetric.     - VIII: hearing is intact to voice    - X: Uvula elevates symmetrically    - XI: Shoulder shrug is symmetric.    - XII: tongue is midline without atrophy or fasciculations.  Sensory: Sensation grossly intact to LT Deep Tendon Reflexes    - 2+ and symmetric in the biceps and patellae with exception of 3+ R patellar and achilles Plantars   - Toes are downgoing bilaterally.  Cerebellar    - FNF and HKS are intact bilaterally   Results - Imaging/Labs   No results found for this or any previous visit (from the past 48 hour(s)).  No results found.  IMAGING: MRI of the cervical and thoracic spine dated 04/01/2018 was reviewed.  Primary finding is at T1-2 where there is grade 1 anterolisthesis, with posterior ligamentous hypertrophy/buckling and resultant severe central stenosis and T2 signal change/edema within  the spinal cord at this level.  Also seen is multilevel disc degenerative disease, worst at C5-6 where there is broad-based disc bulge and moderate to severe central stenosis at this level as well.  There may also be some signal change within the cord at C5-6.  AP and lateral cervical spine x-rays done in the office today were reviewed.  These again demonstrate the anterolisthesis at T1-2.  Given the angle of the T1-2 disc space and the location of the sternum, I do not believe that an anterior approach would be feasible to the T1-2 space.  Impression/Plan   77 y.o. female with rapidly progressive symptoms of thoracic myelopathy, with T1 to anterolisthesis, stenosis, and spinal cord signal change on MRI.  Given the rapidity of her symptom progression and the imaging findings, it was rec that she undergo T1-2 laminectomy with nonsegmental pedicle screw instrumentation stabilization and posterolateral fusion.   While in the office the risks of procedure including risk of spinal cord injury leading to weakness numbness paralysis, risk of infection, bleeding, and spinal fluid leak.  The general risks of anesthesia including heart attack, stroke, and blood clots.  Patient and her husband appear to understand the discussion and are willing to proceed with surgery.  All their questions were answered.

## 2018-04-22 ENCOUNTER — Inpatient Hospital Stay (HOSPITAL_COMMUNITY)
Admission: RE | Admit: 2018-04-22 | Discharge: 2018-04-26 | DRG: 460 | Disposition: A | Discharge status: Rehabilitation Facility | Payer: Medicare Other | Attending: Neurosurgery | Admitting: Neurosurgery

## 2018-04-22 ENCOUNTER — Inpatient Hospital Stay (HOSPITAL_COMMUNITY): Payer: Medicare Other | Admitting: Certified Registered"

## 2018-04-22 ENCOUNTER — Inpatient Hospital Stay (HOSPITAL_COMMUNITY): Payer: Medicare Other

## 2018-04-22 ENCOUNTER — Other Ambulatory Visit: Payer: Self-pay

## 2018-04-22 ENCOUNTER — Encounter (HOSPITAL_COMMUNITY): Payer: Self-pay | Admitting: Certified Registered Nurse Anesthetist

## 2018-04-22 ENCOUNTER — Encounter (HOSPITAL_COMMUNITY)
Admission: RE | Disposition: A | Discharge status: Rehabilitation Facility | Payer: Self-pay | Source: Home / Self Care | Attending: Neurosurgery

## 2018-04-22 DIAGNOSIS — H3530 Unspecified macular degeneration: Secondary | ICD-10-CM | POA: Diagnosis present

## 2018-04-22 DIAGNOSIS — K5900 Constipation, unspecified: Secondary | ICD-10-CM | POA: Diagnosis present

## 2018-04-22 DIAGNOSIS — M4802 Spinal stenosis, cervical region: Secondary | ICD-10-CM | POA: Diagnosis present

## 2018-04-22 DIAGNOSIS — F419 Anxiety disorder, unspecified: Secondary | ICD-10-CM | POA: Diagnosis present

## 2018-04-22 DIAGNOSIS — M1990 Unspecified osteoarthritis, unspecified site: Secondary | ICD-10-CM | POA: Diagnosis present

## 2018-04-22 DIAGNOSIS — Z9049 Acquired absence of other specified parts of digestive tract: Secondary | ICD-10-CM

## 2018-04-22 DIAGNOSIS — R748 Abnormal levels of other serum enzymes: Secondary | ICD-10-CM | POA: Diagnosis not present

## 2018-04-22 DIAGNOSIS — Z91030 Bee allergy status: Secondary | ICD-10-CM

## 2018-04-22 DIAGNOSIS — Z888 Allergy status to other drugs, medicaments and biological substances status: Secondary | ICD-10-CM

## 2018-04-22 DIAGNOSIS — D72828 Other elevated white blood cell count: Secondary | ICD-10-CM | POA: Diagnosis not present

## 2018-04-22 DIAGNOSIS — Z8049 Family history of malignant neoplasm of other genital organs: Secondary | ICD-10-CM | POA: Diagnosis not present

## 2018-04-22 DIAGNOSIS — R739 Hyperglycemia, unspecified: Secondary | ICD-10-CM

## 2018-04-22 DIAGNOSIS — R32 Unspecified urinary incontinence: Secondary | ICD-10-CM | POA: Diagnosis present

## 2018-04-22 DIAGNOSIS — Y92239 Unspecified place in hospital as the place of occurrence of the external cause: Secondary | ICD-10-CM | POA: Diagnosis not present

## 2018-04-22 DIAGNOSIS — Z791 Long term (current) use of non-steroidal anti-inflammatories (NSAID): Secondary | ICD-10-CM

## 2018-04-22 DIAGNOSIS — F05 Delirium due to known physiological condition: Secondary | ICD-10-CM | POA: Diagnosis not present

## 2018-04-22 DIAGNOSIS — G8918 Other acute postprocedural pain: Secondary | ICD-10-CM | POA: Diagnosis not present

## 2018-04-22 DIAGNOSIS — E785 Hyperlipidemia, unspecified: Secondary | ICD-10-CM | POA: Diagnosis present

## 2018-04-22 DIAGNOSIS — Z825 Family history of asthma and other chronic lower respiratory diseases: Secondary | ICD-10-CM | POA: Diagnosis not present

## 2018-04-22 DIAGNOSIS — G992 Myelopathy in diseases classified elsewhere: Secondary | ICD-10-CM | POA: Diagnosis present

## 2018-04-22 DIAGNOSIS — M4804 Spinal stenosis, thoracic region: Secondary | ICD-10-CM | POA: Diagnosis present

## 2018-04-22 DIAGNOSIS — Z419 Encounter for procedure for purposes other than remedying health state, unspecified: Secondary | ICD-10-CM

## 2018-04-22 DIAGNOSIS — Z800 Family history of malignant neoplasm of digestive organs: Secondary | ICD-10-CM | POA: Diagnosis not present

## 2018-04-22 DIAGNOSIS — Z8261 Family history of arthritis: Secondary | ICD-10-CM | POA: Diagnosis not present

## 2018-04-22 DIAGNOSIS — I10 Essential (primary) hypertension: Secondary | ICD-10-CM | POA: Diagnosis present

## 2018-04-22 DIAGNOSIS — Z79899 Other long term (current) drug therapy: Secondary | ICD-10-CM

## 2018-04-22 DIAGNOSIS — T380X5A Adverse effect of glucocorticoids and synthetic analogues, initial encounter: Secondary | ICD-10-CM | POA: Diagnosis not present

## 2018-04-22 DIAGNOSIS — E871 Hypo-osmolality and hyponatremia: Secondary | ICD-10-CM

## 2018-04-22 DIAGNOSIS — D72829 Elevated white blood cell count, unspecified: Secondary | ICD-10-CM

## 2018-04-22 DIAGNOSIS — M47812 Spondylosis without myelopathy or radiculopathy, cervical region: Secondary | ICD-10-CM | POA: Diagnosis not present

## 2018-04-22 DIAGNOSIS — G8220 Paraplegia, unspecified: Secondary | ICD-10-CM | POA: Diagnosis not present

## 2018-04-22 DIAGNOSIS — M4714 Other spondylosis with myelopathy, thoracic region: Secondary | ICD-10-CM | POA: Diagnosis present

## 2018-04-22 DIAGNOSIS — Z4789 Encounter for other orthopedic aftercare: Secondary | ICD-10-CM | POA: Diagnosis not present

## 2018-04-22 DIAGNOSIS — M25511 Pain in right shoulder: Secondary | ICD-10-CM | POA: Diagnosis not present

## 2018-04-22 DIAGNOSIS — R451 Restlessness and agitation: Secondary | ICD-10-CM | POA: Diagnosis not present

## 2018-04-22 DIAGNOSIS — M4314 Spondylolisthesis, thoracic region: Principal | ICD-10-CM | POA: Diagnosis present

## 2018-04-22 SURGERY — POSTERIOR LUMBAR FUSION 1 LEVEL
Anesthesia: General | Site: Spine Thoracic

## 2018-04-22 MED ORDER — CEFAZOLIN SODIUM-DEXTROSE 2-4 GM/100ML-% IV SOLN
INTRAVENOUS | Status: AC
  Filled 2018-04-22: qty 100

## 2018-04-22 MED ORDER — ONDANSETRON HCL 4 MG PO TABS: 4.0000 mg | ORAL_TABLET | Freq: Four times a day (QID) | ORAL | Status: DC | PRN

## 2018-04-22 MED ORDER — THROMBIN 20000 UNITS EX SOLR
CUTANEOUS | Status: AC
  Filled 2018-04-22: qty 20000

## 2018-04-22 MED ORDER — DEXAMETHASONE SODIUM PHOSPHATE 10 MG/ML IJ SOLN
INTRAMUSCULAR | Status: DC | PRN
  Administered 2018-04-22: 10 mg via INTRAVENOUS

## 2018-04-22 MED ORDER — DEXAMETHASONE SODIUM PHOSPHATE 10 MG/ML IJ SOLN
INTRAMUSCULAR | Status: AC
  Filled 2018-04-22: qty 1

## 2018-04-22 MED ORDER — ROCURONIUM BROMIDE 50 MG/5ML IV SOSY
PREFILLED_SYRINGE | INTRAVENOUS | Status: AC
  Filled 2018-04-22: qty 5

## 2018-04-22 MED ORDER — SODIUM CHLORIDE 0.9 % IV SOLN: INTRAVENOUS | Status: DC

## 2018-04-22 MED ORDER — POLYETHYLENE GLYCOL 3350 17 G PO PACK
17.0000 g | PACK | Freq: Every day | ORAL | Status: DC | PRN
  Filled 2018-04-22: qty 1

## 2018-04-22 MED ORDER — METHOCARBAMOL 500 MG PO TABS
500.0000 mg | ORAL_TABLET | Freq: Four times a day (QID) | ORAL | Status: DC | PRN
  Administered 2018-04-22: 500 mg via ORAL
  Filled 2018-04-22: qty 1

## 2018-04-22 MED ORDER — THROMBIN 5000 UNITS EX SOLR
CUTANEOUS | Status: AC
  Filled 2018-04-22: qty 5000

## 2018-04-22 MED ORDER — EZETIMIBE 10 MG PO TABS
10.0000 mg | ORAL_TABLET | Freq: Every day | ORAL | Status: DC
  Filled 2018-04-22 (×3): qty 1

## 2018-04-22 MED ORDER — LACTATED RINGERS IV SOLN
INTRAVENOUS | Status: DC
  Administered 2018-04-22 (×2): via INTRAVENOUS

## 2018-04-22 MED ORDER — ESMOLOL HCL 100 MG/10ML IV SOLN
INTRAVENOUS | Status: DC | PRN
  Administered 2018-04-22: 20 mg via INTRAVENOUS

## 2018-04-22 MED ORDER — LIDOCAINE 2% (20 MG/ML) 5 ML SYRINGE
INTRAMUSCULAR | Status: AC
  Filled 2018-04-22: qty 5

## 2018-04-22 MED ORDER — ACETAMINOPHEN 650 MG RE SUPP: 650.0000 mg | RECTAL | Status: DC | PRN

## 2018-04-22 MED ORDER — CHLORHEXIDINE GLUCONATE CLOTH 2 % EX PADS: 6.0000 | MEDICATED_PAD | Freq: Once | CUTANEOUS | Status: DC

## 2018-04-22 MED ORDER — THROMBIN 5000 UNITS EX SOLR
OROMUCOSAL | Status: DC | PRN
  Administered 2018-04-22 (×2): 5 mL via TOPICAL

## 2018-04-22 MED ORDER — BUPIVACAINE HCL (PF) 0.5 % IJ SOLN
INTRAMUSCULAR | Status: DC | PRN
  Administered 2018-04-22: 4.5 mL

## 2018-04-22 MED ORDER — SODIUM CHLORIDE 0.9 % IV SOLN: 250.0000 mL | INTRAVENOUS | Status: DC

## 2018-04-22 MED ORDER — LIDOCAINE-EPINEPHRINE 1 %-1:100000 IJ SOLN
INTRAMUSCULAR | Status: AC
  Filled 2018-04-22: qty 1

## 2018-04-22 MED ORDER — PHENYLEPHRINE HCL 10 MG/ML IJ SOLN
INTRAMUSCULAR | Status: AC
  Filled 2018-04-22: qty 1

## 2018-04-22 MED ORDER — ESMOLOL HCL 100 MG/10ML IV SOLN
INTRAVENOUS | Status: AC
  Filled 2018-04-22: qty 10

## 2018-04-22 MED ORDER — FENTANYL CITRATE (PF) 100 MCG/2ML IJ SOLN
INTRAMUSCULAR | Status: AC
  Administered 2018-04-22: 50 ug via INTRAVENOUS
  Filled 2018-04-22: qty 2

## 2018-04-22 MED ORDER — DOCUSATE SODIUM 100 MG PO CAPS
100.0000 mg | ORAL_CAPSULE | Freq: Two times a day (BID) | ORAL | Status: DC
  Administered 2018-04-22 – 2018-04-25 (×6): 100 mg via ORAL
  Filled 2018-04-22 (×7): qty 1

## 2018-04-22 MED ORDER — MIDAZOLAM HCL 2 MG/2ML IJ SOLN
INTRAMUSCULAR | Status: AC
  Filled 2018-04-22: qty 2

## 2018-04-22 MED ORDER — LIDOCAINE 2% (20 MG/ML) 5 ML SYRINGE
INTRAMUSCULAR | Status: DC | PRN
  Administered 2018-04-22: 40 mg via INTRAVENOUS

## 2018-04-22 MED ORDER — 0.9 % SODIUM CHLORIDE (POUR BTL) OPTIME
TOPICAL | Status: DC | PRN
  Administered 2018-04-22: 1000 mL

## 2018-04-22 MED ORDER — ARTIFICIAL TEARS OPHTHALMIC OINT
TOPICAL_OINTMENT | OPHTHALMIC | Status: DC | PRN
  Administered 2018-04-22: 1 via OPHTHALMIC

## 2018-04-22 MED ORDER — BACITRACIN ZINC 500 UNIT/GM EX OINT
TOPICAL_OINTMENT | CUTANEOUS | Status: DC | PRN
  Administered 2018-04-22: 1 via TOPICAL

## 2018-04-22 MED ORDER — PHENYLEPHRINE 40 MCG/ML (10ML) SYRINGE FOR IV PUSH (FOR BLOOD PRESSURE SUPPORT)
PREFILLED_SYRINGE | INTRAVENOUS | Status: DC | PRN
  Administered 2018-04-22: 40 ug via INTRAVENOUS

## 2018-04-22 MED ORDER — ROCURONIUM BROMIDE 100 MG/10ML IV SOLN
INTRAVENOUS | Status: DC | PRN
  Administered 2018-04-22: 30 mg via INTRAVENOUS
  Administered 2018-04-22: 50 mg via INTRAVENOUS
  Administered 2018-04-22: 10 mg via INTRAVENOUS

## 2018-04-22 MED ORDER — SODIUM CHLORIDE 0.9 % IV SOLN
INTRAVENOUS | Status: DC | PRN
  Administered 2018-04-22: 50 ug/min via INTRAVENOUS

## 2018-04-22 MED ORDER — SODIUM CHLORIDE 0.9 % IV SOLN
INTRAVENOUS | Status: DC | PRN
  Administered 2018-04-22: 500 mL

## 2018-04-22 MED ORDER — OXYCODONE HCL 5 MG PO TABS
5.0000 mg | ORAL_TABLET | ORAL | Status: DC | PRN
  Administered 2018-04-24 – 2018-04-26 (×2): 5 mg via ORAL
  Filled 2018-04-22 (×2): qty 1

## 2018-04-22 MED ORDER — PROPYLENE GLYCOL 0.6 % OP SOLN: 1.0000 [drp] | Freq: Every day | OPHTHALMIC | Status: DC | PRN

## 2018-04-22 MED ORDER — LIDOCAINE-EPINEPHRINE 1 %-1:100000 IJ SOLN
INTRAMUSCULAR | Status: DC | PRN
  Administered 2018-04-22: 4.5 mL

## 2018-04-22 MED ORDER — CEFAZOLIN SODIUM-DEXTROSE 2-4 GM/100ML-% IV SOLN
2.0000 g | INTRAVENOUS | Status: AC
  Administered 2018-04-22: 2 g via INTRAVENOUS

## 2018-04-22 MED ORDER — POLYVINYL ALCOHOL 1.4 % OP SOLN
1.0000 [drp] | Freq: Every day | OPHTHALMIC | Status: DC | PRN
  Filled 2018-04-22: qty 15

## 2018-04-22 MED ORDER — FENTANYL CITRATE (PF) 100 MCG/2ML IJ SOLN
25.0000 ug | INTRAMUSCULAR | Status: DC | PRN
  Administered 2018-04-22: 25 ug via INTRAVENOUS
  Administered 2018-04-22: 50 ug via INTRAVENOUS
  Administered 2018-04-22: 25 ug via INTRAVENOUS
  Administered 2018-04-22: 50 ug via INTRAVENOUS

## 2018-04-22 MED ORDER — PANTOPRAZOLE SODIUM 40 MG PO TBEC
40.0000 mg | DELAYED_RELEASE_TABLET | Freq: Every day | ORAL | Status: DC
  Administered 2018-04-22 – 2018-04-25 (×4): 40 mg via ORAL
  Filled 2018-04-22 (×4): qty 1

## 2018-04-22 MED ORDER — AMLODIPINE BESYLATE 2.5 MG PO TABS
2.5000 mg | ORAL_TABLET | Freq: Every day | ORAL | Status: DC
  Filled 2018-04-22 (×3): qty 1

## 2018-04-22 MED ORDER — SUGAMMADEX SODIUM 200 MG/2ML IV SOLN
INTRAVENOUS | Status: DC | PRN
  Administered 2018-04-22: 200 mg via INTRAVENOUS

## 2018-04-22 MED ORDER — ONDANSETRON HCL 4 MG/2ML IJ SOLN: 4.0000 mg | Freq: Once | INTRAMUSCULAR | Status: DC | PRN

## 2018-04-22 MED ORDER — SENNA 8.6 MG PO TABS
1.0000 | ORAL_TABLET | Freq: Two times a day (BID) | ORAL | Status: DC
  Administered 2018-04-22 – 2018-04-25 (×6): 8.6 mg via ORAL
  Filled 2018-04-22 (×7): qty 1

## 2018-04-22 MED ORDER — PROPOFOL 10 MG/ML IV BOLUS
INTRAVENOUS | Status: AC
  Filled 2018-04-22: qty 20

## 2018-04-22 MED ORDER — OXYCODONE HCL 5 MG PO TABS
10.0000 mg | ORAL_TABLET | ORAL | Status: DC | PRN
  Administered 2018-04-22 – 2018-04-24 (×6): 10 mg via ORAL
  Filled 2018-04-22 (×7): qty 2

## 2018-04-22 MED ORDER — BACITRACIN ZINC 500 UNIT/GM EX OINT
TOPICAL_OINTMENT | CUTANEOUS | Status: AC
  Filled 2018-04-22: qty 28.35

## 2018-04-22 MED ORDER — FENTANYL CITRATE (PF) 100 MCG/2ML IJ SOLN
INTRAMUSCULAR | Status: DC | PRN
  Administered 2018-04-22 (×2): 50 ug via INTRAVENOUS
  Administered 2018-04-22: 100 ug via INTRAVENOUS
  Administered 2018-04-22: 50 ug via INTRAVENOUS

## 2018-04-22 MED ORDER — EPHEDRINE SULFATE-NACL 50-0.9 MG/10ML-% IV SOSY
PREFILLED_SYRINGE | INTRAVENOUS | Status: DC | PRN
  Administered 2018-04-22: 5 mg via INTRAVENOUS

## 2018-04-22 MED ORDER — SODIUM CHLORIDE 0.9% FLUSH
3.0000 mL | Freq: Two times a day (BID) | INTRAVENOUS | Status: DC
  Administered 2018-04-22 – 2018-04-25 (×3): 3 mL via INTRAVENOUS

## 2018-04-22 MED ORDER — PROPOFOL 10 MG/ML IV BOLUS
INTRAVENOUS | Status: DC | PRN
  Administered 2018-04-22: 150 mg via INTRAVENOUS
  Administered 2018-04-22: 50 mg via INTRAVENOUS

## 2018-04-22 MED ORDER — ONDANSETRON HCL 4 MG/2ML IJ SOLN: 4.0000 mg | Freq: Four times a day (QID) | INTRAMUSCULAR | Status: DC | PRN

## 2018-04-22 MED ORDER — PHENYLEPHRINE 40 MCG/ML (10ML) SYRINGE FOR IV PUSH (FOR BLOOD PRESSURE SUPPORT)
PREFILLED_SYRINGE | INTRAVENOUS | Status: AC
  Filled 2018-04-22: qty 10

## 2018-04-22 MED ORDER — BUPIVACAINE HCL (PF) 0.5 % IJ SOLN
INTRAMUSCULAR | Status: AC
  Filled 2018-04-22: qty 30

## 2018-04-22 MED ORDER — PHENOL 1.4 % MT LIQD: 1.0000 | OROMUCOSAL | Status: DC | PRN

## 2018-04-22 MED ORDER — BISACODYL 10 MG RE SUPP
10.0000 mg | Freq: Every day | RECTAL | Status: DC | PRN
  Filled 2018-04-22: qty 1

## 2018-04-22 MED ORDER — SODIUM CHLORIDE 0.9% FLUSH: 3.0000 mL | INTRAVENOUS | Status: DC | PRN

## 2018-04-22 MED ORDER — EPHEDRINE 5 MG/ML INJ
INTRAVENOUS | Status: AC
  Filled 2018-04-22: qty 10

## 2018-04-22 MED ORDER — ONDANSETRON HCL 4 MG/2ML IJ SOLN
INTRAMUSCULAR | Status: DC | PRN
  Administered 2018-04-22: 4 mg via INTRAVENOUS

## 2018-04-22 MED ORDER — MENTHOL 3 MG MT LOZG: 1.0000 | LOZENGE | OROMUCOSAL | Status: DC | PRN

## 2018-04-22 MED ORDER — ACETAMINOPHEN 325 MG PO TABS
650.0000 mg | ORAL_TABLET | ORAL | Status: DC | PRN
  Administered 2018-04-25 – 2018-04-26 (×3): 650 mg via ORAL
  Filled 2018-04-22 (×3): qty 2

## 2018-04-22 MED ORDER — PANTOPRAZOLE SODIUM 40 MG IV SOLR: 40.0000 mg | Freq: Every day | INTRAVENOUS | Status: DC

## 2018-04-22 MED ORDER — FENTANYL CITRATE (PF) 250 MCG/5ML IJ SOLN
INTRAMUSCULAR | Status: AC
  Filled 2018-04-22: qty 5

## 2018-04-22 MED ORDER — METHOCARBAMOL 1000 MG/10ML IJ SOLN
500.0000 mg | Freq: Four times a day (QID) | INTRAVENOUS | Status: DC | PRN
  Filled 2018-04-22: qty 5

## 2018-04-22 MED ORDER — PRESERVISION AREDS 2 PO CAPS: ORAL_CAPSULE | Freq: Two times a day (BID) | ORAL | Status: DC

## 2018-04-22 MED ORDER — PROSIGHT PO TABS
1.0000 | ORAL_TABLET | Freq: Every day | ORAL | Status: DC
  Filled 2018-04-22 (×3): qty 1

## 2018-04-22 MED ORDER — CEFAZOLIN SODIUM-DEXTROSE 2-4 GM/100ML-% IV SOLN
2.0000 g | Freq: Three times a day (TID) | INTRAVENOUS | Status: AC
  Administered 2018-04-22 – 2018-04-23 (×2): 2 g via INTRAVENOUS
  Filled 2018-04-22 (×2): qty 100

## 2018-04-22 MED ORDER — FLEET ENEMA 7-19 GM/118ML RE ENEM: 1.0000 | ENEMA | Freq: Once | RECTAL | Status: DC | PRN

## 2018-04-22 MED ORDER — MIDAZOLAM HCL 5 MG/5ML IJ SOLN
INTRAMUSCULAR | Status: DC | PRN
  Administered 2018-04-22 (×2): 1 mg via INTRAVENOUS

## 2018-04-22 MED ORDER — ONDANSETRON HCL 4 MG/2ML IJ SOLN
INTRAMUSCULAR | Status: AC
  Filled 2018-04-22: qty 2

## 2018-04-22 SURGICAL SUPPLY — 75 items
BAG DECANTER FOR FLEXI CONT (MISCELLANEOUS) ×3 IMPLANT
BASKET BONE COLLECTION (BASKET) ×3 IMPLANT
BENZOIN TINCTURE PRP APPL 2/3 (GAUZE/BANDAGES/DRESSINGS) IMPLANT
BIT DRILL 3.5 POWEREASE (BIT) ×2 IMPLANT
BIT DRILL 3.5MM POWEREASE (BIT) ×1
BLADE CLIPPER SURG (BLADE) IMPLANT
BLADE SURG 11 STRL SS (BLADE) ×3 IMPLANT
BUR MATCHSTICK NEURO 3.0 LAGG (BURR) ×3 IMPLANT
BUR PRECISION FLUTE 5.0 (BURR) ×3 IMPLANT
CANISTER SUCT 3000ML PPV (MISCELLANEOUS) ×3 IMPLANT
CARTRIDGE OIL MAESTRO DRILL (MISCELLANEOUS) ×1 IMPLANT
CATH FOLEY 2WAY SLVR  5CC 14FR (CATHETERS) ×2
CATH FOLEY 2WAY SLVR 5CC 14FR (CATHETERS) ×1 IMPLANT
CLOSURE WOUND 1/2 X4 (GAUZE/BANDAGES/DRESSINGS)
CONT SPEC 4OZ CLIKSEAL STRL BL (MISCELLANEOUS) ×3 IMPLANT
COVER BACK TABLE 60X90IN (DRAPES) ×3 IMPLANT
COVER WAND RF STERILE (DRAPES) ×3 IMPLANT
DECANTER SPIKE VIAL GLASS SM (MISCELLANEOUS) ×3 IMPLANT
DERMABOND ADVANCED (GAUZE/BANDAGES/DRESSINGS) ×2
DERMABOND ADVANCED .7 DNX12 (GAUZE/BANDAGES/DRESSINGS) ×1 IMPLANT
DIFFUSER DRILL AIR PNEUMATIC (MISCELLANEOUS) ×3 IMPLANT
DRAPE C-ARM 42X72 X-RAY (DRAPES) ×3 IMPLANT
DRAPE C-ARMOR (DRAPES) ×3 IMPLANT
DRAPE LAPAROTOMY 100X72X124 (DRAPES) ×3 IMPLANT
DRAPE SURG 17X23 STRL (DRAPES) ×3 IMPLANT
DRSG OPSITE POSTOP 4X8 (GAUZE/BANDAGES/DRESSINGS) ×3 IMPLANT
DURAPREP 26ML APPLICATOR (WOUND CARE) ×3 IMPLANT
ELECT REM PT RETURN 9FT ADLT (ELECTROSURGICAL) ×3
ELECTRODE REM PT RTRN 9FT ADLT (ELECTROSURGICAL) ×1 IMPLANT
GAUZE 4X4 16PLY RFD (DISPOSABLE) IMPLANT
GAUZE SPONGE 4X4 12PLY STRL (GAUZE/BANDAGES/DRESSINGS) IMPLANT
GLOVE BIO SURGEON STRL SZ7.5 (GLOVE) IMPLANT
GLOVE BIOGEL PI IND STRL 7.0 (GLOVE) ×2 IMPLANT
GLOVE BIOGEL PI IND STRL 7.5 (GLOVE) ×5 IMPLANT
GLOVE BIOGEL PI IND STRL 8 (GLOVE) ×1 IMPLANT
GLOVE BIOGEL PI INDICATOR 7.0 (GLOVE) ×4
GLOVE BIOGEL PI INDICATOR 7.5 (GLOVE) ×10
GLOVE BIOGEL PI INDICATOR 8 (GLOVE) ×2
GLOVE ECLIPSE 7.0 STRL STRAW (GLOVE) ×6 IMPLANT
GLOVE ECLIPSE 7.5 STRL STRAW (GLOVE) ×3 IMPLANT
GLOVE EXAM NITRILE XL STR (GLOVE) IMPLANT
GLOVE SURG SS PI 7.0 STRL IVOR (GLOVE) ×9 IMPLANT
GOWN STRL REUS W/ TWL LRG LVL3 (GOWN DISPOSABLE) ×3 IMPLANT
GOWN STRL REUS W/ TWL XL LVL3 (GOWN DISPOSABLE) ×1 IMPLANT
GOWN STRL REUS W/TWL 2XL LVL3 (GOWN DISPOSABLE) IMPLANT
GOWN STRL REUS W/TWL LRG LVL3 (GOWN DISPOSABLE) ×6
GOWN STRL REUS W/TWL XL LVL3 (GOWN DISPOSABLE) ×2
HEMOSTAT POWDER KIT SURGIFOAM (HEMOSTASIS) ×6 IMPLANT
KIT BASIN OR (CUSTOM PROCEDURE TRAY) ×3 IMPLANT
KIT POSITION SURG JACKSON T1 (MISCELLANEOUS) IMPLANT
KIT TURNOVER KIT B (KITS) ×3 IMPLANT
MILL MEDIUM DISP (BLADE) IMPLANT
NEEDLE HYPO 18GX1.5 BLUNT FILL (NEEDLE) IMPLANT
NEEDLE HYPO 22GX1.5 SAFETY (NEEDLE) ×3 IMPLANT
NEEDLE SPNL 18GX3.5 QUINCKE PK (NEEDLE) ×3 IMPLANT
NS IRRIG 1000ML POUR BTL (IV SOLUTION) ×3 IMPLANT
OIL CARTRIDGE MAESTRO DRILL (MISCELLANEOUS) ×3
PACK LAMINECTOMY NEURO (CUSTOM PROCEDURE TRAY) ×3 IMPLANT
PAD ARMBOARD 7.5X6 YLW CONV (MISCELLANEOUS) ×9 IMPLANT
PATTIES SURGICAL 1X1 (DISPOSABLE) ×3 IMPLANT
ROD CC 30MM (Rod) ×6 IMPLANT
SCREW SET SOLERA (Screw) ×8 IMPLANT
SCREW SET SOLERA TI (Screw) ×4 IMPLANT
SCREW SPINAL ATS SOLERA 4.5X35 (Screw) ×12 IMPLANT
SPONGE LAP 4X18 RFD (DISPOSABLE) IMPLANT
SPONGE SURGIFOAM ABS GEL 100 (HEMOSTASIS) IMPLANT
STRIP CLOSURE SKIN 1/2X4 (GAUZE/BANDAGES/DRESSINGS) IMPLANT
SUT VIC AB 0 CT1 18XCR BRD8 (SUTURE) ×1 IMPLANT
SUT VIC AB 0 CT1 8-18 (SUTURE) ×2
SUT VICRYL 3-0 RB1 18 ABS (SUTURE) ×6 IMPLANT
SYR 3ML LL SCALE MARK (SYRINGE) IMPLANT
TOWEL GREEN STERILE (TOWEL DISPOSABLE) ×3 IMPLANT
TOWEL GREEN STERILE FF (TOWEL DISPOSABLE) ×3 IMPLANT
TRAY FOLEY MTR SLVR 16FR STAT (SET/KITS/TRAYS/PACK) ×3 IMPLANT
WATER STERILE IRR 1000ML POUR (IV SOLUTION) ×3 IMPLANT

## 2018-04-22 NOTE — Plan of Care (Signed)
  Problem: Education: Goal: Ability to verbalize activity precautions or restrictions will improve Outcome: Progressing Goal: Knowledge of the prescribed therapeutic regimen will improve Outcome: Progressing Goal: Understanding of discharge needs will improve Outcome: Progressing   Problem: Bowel/Gastric: Goal: Gastrointestinal status for postoperative course will improve Outcome: Progressing   Problem: Pain Management: Goal: Pain level will decrease Outcome: Progressing   Problem: Skin Integrity: Goal: Will show signs of wound healing Outcome: Progressing

## 2018-04-22 NOTE — Op Note (Signed)
NEUROSURGERY OPERATIVE NOTE   PREOP DIAGNOSIS:  1. Thoracic myelopathy, T1-2 2. Spondylolisthesis, T1-2  POSTOP DIAGNOSIS: Same  PROCEDURE: 1. T1-T2 laminectomy for decompression of spinal cord 2. Nonsegmental instrumentation using pedicle screws at T1-T2 -[employer] Solera 4.5 x 35 mm screws x4 3. Posterolateral arthrodesis, T1-T2 4. Use of morselized bone autograft harvested using the same incision  SURGEON: Dr. Lisbeth RenshawNeelesh Adan Beal, MD  ASSISTANT: Dr. Shirlean Kellyobert Nudelman, MD  ANESTHESIA: General Endotracheal  EBL: 350cc  SPECIMENS: None  DRAINS: None  COMPLICATIONS: None immediate  CONDITION: Hemodynamically stable to PACU  HISTORY: Cheryl Pearson is a 77 y.o. female initially presented to the outpatient neurosurgery clinic with signs and symptoms consistent with thoracic myelopathy.  She did have MRI of the cervical and thoracic spine which demonstrated primary pathology at T1 to where there is degenerative spondylolisthesis with significant spinal cord compression and intrinsic T2 signal change.  With her clinical and radiographic findings surgical decompression and stabilization was indicated.  The risks and benefits of the surgery were explained in detail to the patient and her family.  After all questions were answered informed consent was obtained and witnessed.  PROCEDURE IN DETAIL: The patient was brought to the operating room. After induction of general anesthesia, the patient was positioned on the operative table in the [place] head holder in the prone position. All pressure points were meticulously padded. Skin incision was then marked out and prepped and draped in the usual sterile fashion.  After timeout was conducted, midline skin incision was infiltrated with local anesthetic with epinephrine.  Incision was then made sharply and carried down through the subcutaneous tissue.  The thoracodorsal fascia was identified and incised.  Spinous processes at the bottom of C7, T1,  and T2 were identified.  Dissector was placed at the level of the T1 pedicle, and a second spinal needle was placed in the mid cervical spine in order to identify levels using lateral fluoroscopy.  I did confirm that the dissector was in fact at the T1 pedicle.  At this point attention was turned to decompression.  Using a combination of rongeurs and a high-speed drill, complete laminectomy at T1 and T2 was completed.  I did note a significant amount of thickening of the ligamentum flavum at the T1-2 interspace.  Kerrison punches were used to widen the laminectomy.  This ensured good decompression of the thecal sac.  Small ball-tipped dissector was then used to identify the medial, superior, and inferior borders of the T1 and T2 pedicles bilaterally.  Using a combination of standard anatomic landmarks and AP fluoroscopy, entry points for bilateral T1 and T2 pedicle screws were identified and drilled.  Pilot holes were then created and tapped to 4.5 x 35 mm.  Pedicle screws were then inserted at T1 and T2 bilaterally measuring 4.5 x 35 mm.  Position was confirmed with fluoroscopy.  At this point the T1-2 facet complex as well as the remaining portion of the lamina at T1 and T2 was decorticated in preparation for posterolateral arthrodesis.  30 mm pre-bent lordotic rod was then placed and secured with set screws which were final tightened.  Bone harvested during the decompression portion of the procedure was removed of any soft tissue, morselized, and placed over the decorticated bone surfaces.  Self-retaining retractor was then removed and final AP fluoroscopic image was taken to confirm good location of the hardware.  At this point, self-retaining retractor was replaced, and hemostasis was secured using a combination of bipolar electrocautery and morselized Gelfoam  with thrombin.  The wound was then closed in standard fashion in multiple layers using a combination of 0 and 3-0 Vicryl stitches.  Skin was  closed with Dermabond.  After Dermabond dried completely, sterile dressing was applied.  The patient was then transferred to the stretcher and removed from the [place]field head holder.  She was then extubated and taken to the postanesthesia care unit in stable hemodynamic condition.  At the end of the case all sponge, needle, instrument, and cottonoid counts were correct.

## 2018-04-22 NOTE — Anesthesia Preprocedure Evaluation (Addendum)
Anesthesia Evaluation  Patient identified by MRN, date of birth, ID band Patient awake    Reviewed: Allergy & Precautions, NPO status , Patient's Chart, lab work & pertinent test results  Airway Mallampati: II  TM Distance: >3 FB Neck ROM: Full    Dental no notable dental hx.    Pulmonary neg pulmonary ROS,    Pulmonary exam normal breath sounds clear to auscultation       Cardiovascular hypertension, Pt. on medications Normal cardiovascular exam Rhythm:Regular Rate:Normal  ECG: rate 78. Normal sinus rhythm Left anterior fasicular block   Neuro/Psych Anxiety Right leg weakness and numbness     GI/Hepatic Neg liver ROS, Esophageal stricture    Endo/Other  negative endocrine ROS  Renal/GU negative Renal ROS     Musculoskeletal negative musculoskeletal ROS (+)   Abdominal   Peds  Hematology negative hematology ROS (+)   Anesthesia Other Findings THORACIC MYELOPATHY  Reproductive/Obstetrics                            Anesthesia Physical Anesthesia Plan  ASA: II  Anesthesia Plan: General   Post-op Pain Management:    Induction: Intravenous  PONV Risk Score and Plan: 4 or greater and Ondansetron, Dexamethasone, Midazolam and Treatment may vary due to age or medical condition  Airway Management Planned: Oral ETT  Additional Equipment:   Intra-op Plan:   Post-operative Plan: Extubation in OR  Informed Consent: I have reviewed the patients History and Physical, chart, labs and discussed the procedure including the risks, benefits and alternatives for the proposed anesthesia with the patient or authorized representative who has indicated his/her understanding and acceptance.   Dental advisory given  Plan Discussed with: CRNA  Anesthesia Plan Comments:         Anesthesia Quick Evaluation

## 2018-04-22 NOTE — Transfer of Care (Signed)
Immediate Anesthesia Transfer of Care Note  Patient: Waldo LaineLila F Apodaca  Procedure(s) Performed: THORACIC ONE- THORACIC TWO  LAMINECTOMY, POSTERIOR NON-SEGMENTAL INSTRUMENTATION, POSTERIOR LATERAL ARTHRODESIS (N/A Spine Thoracic)  Patient Location: PACU  Anesthesia Type:General  Level of Consciousness: awake and patient cooperative  Airway & Oxygen Therapy: Patient Spontanous Breathing and Patient connected to face mask oxygen  Post-op Assessment: Report given to RN and Post -op Vital signs reviewed and stable  Post vital signs: Reviewed and stable  Last Vitals:  Vitals Value Taken Time  BP 152/77 04/22/2018  3:54 PM  Temp    Pulse 76 04/22/2018  3:56 PM  Resp 12 04/22/2018  3:56 PM  SpO2 100 % 04/22/2018  3:56 PM  Vitals shown include unvalidated device data.  Last Pain:  Vitals:   04/22/18 1039  TempSrc: Oral         Complications: No apparent anesthesia complications

## 2018-04-22 NOTE — Anesthesia Postprocedure Evaluation (Signed)
Anesthesia Post Note  Patient: Cheryl Pearson  Procedure(s) Performed: THORACIC ONE- THORACIC TWO  LAMINECTOMY, POSTERIOR NON-SEGMENTAL INSTRUMENTATION, POSTERIOR LATERAL ARTHRODESIS (N/A Spine Thoracic)     Patient location during evaluation: PACU Anesthesia Type: General Level of consciousness: awake and alert Pain management: pain level controlled Vital Signs Assessment: post-procedure vital signs reviewed and stable Respiratory status: spontaneous breathing, nonlabored ventilation, respiratory function stable and patient connected to nasal cannula oxygen Cardiovascular status: blood pressure returned to baseline and stable Postop Assessment: no apparent nausea or vomiting Anesthetic complications: no    Last Vitals:  Vitals:   04/22/18 1726 04/22/18 1930  BP: (!) 154/77 (!) 153/61  Pulse: 83 (!) 106  Resp: 18 18  Temp: (!) 36.3 C 36.6 C  SpO2: 98% 97%    Last Pain:  Vitals:   04/22/18 1930  TempSrc: Oral  PainSc:                  Azalynn Maxim P Summerlynn Glauser

## 2018-04-22 NOTE — Anesthesia Procedure Notes (Signed)
Procedure Name: Intubation Date/Time: 04/22/2018 12:58 PM Performed by: Marny Lowensteinapozzi, Libni Fusaro W, CRNA Pre-anesthesia Checklist: Patient identified, Emergency Drugs available, Suction available and Patient being monitored Patient Re-evaluated:Patient Re-evaluated prior to induction Oxygen Delivery Method: Circle system utilized Preoxygenation: Pre-oxygenation with 100% oxygen Induction Type: IV induction Ventilation: Mask ventilation without difficulty Laryngoscope Size: Glidescope and 3 Grade View: Grade I Tube type: Oral Tube size: 7.0 mm Number of attempts: 2 Airway Equipment and Method: Stylet Placement Confirmation: ETT inserted through vocal cords under direct vision,  positive ETCO2 and breath sounds checked- equal and bilateral Secured at: 21 cm Tube secured with: Tape Dental Injury: Teeth and Oropharynx as per pre-operative assessment  Difficulty Due To: Difficult Airway- due to reduced neck mobility Comments: DL #1 with Miller 2; no view. DL #2 with glidescope; grade I view.

## 2018-04-23 DIAGNOSIS — E871 Hypo-osmolality and hyponatremia: Secondary | ICD-10-CM

## 2018-04-23 DIAGNOSIS — T380X5A Adverse effect of glucocorticoids and synthetic analogues, initial encounter: Secondary | ICD-10-CM

## 2018-04-23 DIAGNOSIS — D72829 Elevated white blood cell count, unspecified: Secondary | ICD-10-CM

## 2018-04-23 DIAGNOSIS — M4714 Other spondylosis with myelopathy, thoracic region: Secondary | ICD-10-CM

## 2018-04-23 DIAGNOSIS — R739 Hyperglycemia, unspecified: Secondary | ICD-10-CM

## 2018-04-23 DIAGNOSIS — I10 Essential (primary) hypertension: Secondary | ICD-10-CM

## 2018-04-23 DIAGNOSIS — G8918 Other acute postprocedural pain: Secondary | ICD-10-CM

## 2018-04-23 DIAGNOSIS — D72828 Other elevated white blood cell count: Secondary | ICD-10-CM

## 2018-04-23 DIAGNOSIS — M47812 Spondylosis without myelopathy or radiculopathy, cervical region: Secondary | ICD-10-CM

## 2018-04-23 LAB — BASIC METABOLIC PANEL
Anion gap: 10 (ref 5–15)
BUN: 17 mg/dL (ref 8–23)
CO2: 26 mmol/L (ref 22–32)
Calcium: 9.4 mg/dL (ref 8.9–10.3)
Chloride: 96 mmol/L — ABNORMAL LOW (ref 98–111)
Creatinine, Ser: 0.9 mg/dL (ref 0.44–1.00)
GFR calc Af Amer: >60 mL/min (ref >60)
GFR calc non Af Amer: >60 mL/min (ref >60)
Glucose, Bld: 148 mg/dL — ABNORMAL HIGH (ref 70–99)
Potassium: 4.2 mmol/L (ref 3.5–5.1)
Sodium: 132 mmol/L — ABNORMAL LOW (ref 135–145)

## 2018-04-23 LAB — CBC
HCT: 37.7 % (ref 36.0–46.0)
Hemoglobin: 12.4 g/dL (ref 12.0–15.0)
MCH: 30 pg (ref 26.0–34.0)
MCHC: 32.9 g/dL (ref 30.0–36.0)
MCV: 91.1 fL (ref 80.0–100.0)
Platelets: 363 10*3/uL (ref 150–400)
RBC: 4.14 MIL/uL (ref 3.87–5.11)
RDW: 12.9 % (ref 11.5–15.5)
WBC: 18.7 10*3/uL — ABNORMAL HIGH (ref 4.0–10.5)
nRBC: 0 % (ref 0.0–0.2)

## 2018-04-23 MED ORDER — METHOCARBAMOL 1000 MG/10ML IJ SOLN
750.0000 mg | Freq: Four times a day (QID) | INTRAVENOUS | Status: DC | PRN
  Filled 2018-04-23: qty 7.5

## 2018-04-23 MED ORDER — METHOCARBAMOL 750 MG PO TABS
750.0000 mg | ORAL_TABLET | Freq: Four times a day (QID) | ORAL | Status: DC | PRN
  Administered 2018-04-23 – 2018-04-26 (×3): 750 mg via ORAL
  Filled 2018-04-23 (×3): qty 1

## 2018-04-23 NOTE — Progress Notes (Signed)
  NEUROSURGERY PROGRESS NOTE   No issues overnight.  Complains of neck soreness and right shoulder pain although manageable Husband noted mild confusion this am but that has since resolved. Pre op RLE weakness stable  EXAM:  BP (!) 103/52 (BP Location: Left Arm)   Pulse 89   Temp 98.6 F (37 C) (Oral)   Resp (!) 28   Ht 5\' 5"  (1.651 m)   Wt 72.6 kg   SpO2 97%   BMI 26.63 kg/m   Awake, alert, oriented  Speech fluent, appropriate  CN grossly intact  RLE 3/4-/5, otherwise 5/5   IMPRESSION/PLAN 77 y.o. female s/p thoracic fusion due to thoracic myelopathy. Stable pre op RLE weakness.  - PT/OT rec CIR. Consult placed. Believe she would be an excellent candidate. - Confusion: resolved. Will check basic labs - Continue supportive care

## 2018-04-23 NOTE — Progress Notes (Signed)
Patient is transferred from room 3C08 to unit 5N03 at this time. Alert and in stable condition. Report given to receiving nurse Synetta FailAnita, RN with all questions answered. Transported via wheelchair with husband and all belongings at side.

## 2018-04-23 NOTE — Evaluation (Signed)
Physical Therapy Evaluation Patient Details Name: Cheryl Pearson MRN: [id number] DOB: 07/30/1940 Today's Date: 04/23/2018   History of Present Illness  77 y.o. female s/p thoracic fusion due to thoracic myelopathy  Clinical Impression  Patient seen for mobility assessment s/p spinal surgery.Patient demonstrates deficits in functional mobility as indicated below. Will benefit from continued skilled PT to address deficits and maximize function. Will see as indicated and progress as tolerated. At this time, patient somewhat self limiting and requires significant cues for mobility and precautions.  Educated patient on precautions, mobility expectations, safety and RW use.     Follow Up Recommendations Home health PT;Supervision/Assistance - 24 hour    Equipment Recommendations  Rolling walker with 5" wheels    Recommendations for Other Services       Precautions / Restrictions Precautions Precautions: Back Precaution Booklet Issued: Yes (comment) Precaution Comments: verbally reviewed (patient with poor carry over/comliance) Restrictions Weight Bearing Restrictions: No      Mobility  Bed Mobility               General bed mobility comments: received sitting EOB  Transfers Overall transfer level: Needs assistance Equipment used: Rolling walker (2 wheeled) Transfers: Sit to/from Stand Sit to Stand: Min guard         General transfer comment: Min guard for safety, Max cues for compliance with precautions  Ambulation/Gait Ambulation/Gait assistance: Min guard Gait Distance (Feet): 160 Feet Assistive device: Rolling walker (2 wheeled) Gait Pattern/deviations: Step-through pattern;Decreased stride length;Shuffle;Drifts right/left Gait velocity: decreased Gait velocity interpretation: <1.8 ft/sec, indicate of risk for recurrent falls General Gait Details: Patient cues for upright posture and positioning as well as increased cadence. Patient reluctant to comply with cues  or precautions  Stairs            Wheelchair Mobility    Modified Rankin (Stroke Patients Only)       Balance Overall balance assessment: History of Falls                                           Pertinent Vitals/Pain Pain Assessment: Faces Faces Pain Scale: Hurts even more Pain Location: back and arms Pain Descriptors / Indicators: Sharp Pain Intervention(s): Monitored during session    Home Living Family/patient expects to be discharged to:: Private residence Living Arrangements: Spouse/significant other Available Help at Discharge: Family Type of Home: House Home Access: Stairs to enter Entrance Stairs-Rails: Can reach both Entrance Stairs-Number of Steps: 2 Home Layout: One level Home Equipment: Scientist, forensicWalker - standard;Walker - 4 wheels;Bedside commode      Prior Function Level of Independence: Independent with assistive device(s)         Comments: uses an standard RW     Hand Dominance   Dominant Hand: Right    Extremity/Trunk Assessment   Upper Extremity Assessment Upper Extremity Assessment: Defer to OT evaluation    Lower Extremity Assessment Lower Extremity Assessment: RLE deficits/detail RLE Deficits / Details: noted RLE weakness reported per patient, appears functional upon testing grossly 4/5 all motions including dorsiflexion. no evidence of foot drop as previously indicated by patient RLE Sensation: history of peripheral neuropathy    Cervical / Trunk Assessment Cervical / Trunk Assessment: (s/p spinal surgery)  Communication      Cognition Arousal/Alertness: Awake/alert Behavior During Therapy: Anxious Overall Cognitive Status: Impaired/Different from baseline Area of Impairment: Problem solving  Problem Solving: Requires verbal cues;Requires tactile cues;Slow processing General Comments: question slow processing due to distraction by pain and patients tangential behaviors       General Comments      Exercises     Assessment/Plan    PT Assessment Patient needs continued PT services  PT Problem List Decreased strength;Decreased activity tolerance;Decreased balance;Decreased cognition;Decreased safety awareness;Decreased knowledge of precautions;Pain       PT Treatment Interventions DME instruction;Functional mobility training;Therapeutic exercise;Therapeutic activities;Stair training;Gait training;Neuromuscular re-education;Balance training;Patient/family education;Cognitive remediation    PT Goals (Current goals can be found in the Care Plan section)  Acute Rehab PT Goals Patient Stated Goal: to not hurt at all PT Goal Formulation: With patient/family Time For Goal Achievement: 05/07/18 Potential to Achieve Goals: Good    Frequency Min 5X/week   Barriers to discharge        Co-evaluation               AM-PAC PT "6 Clicks" Mobility  Outcome Measure Help needed turning from your back to your side while in a flat bed without using bedrails?: A Little Help needed moving from lying on your back to sitting on the side of a flat bed without using bedrails?: A Little Help needed moving to and from a bed to a chair (including a wheelchair)?: A Little Help needed standing up from a chair using your arms (e.g., wheelchair or bedside chair)?: A Little Help needed to walk in hospital room?: A Little Help needed climbing 3-5 steps with a railing? : A Little 6 Click Score: 18    End of Session Equipment Utilized During Treatment: Gait belt Activity Tolerance: Patient tolerated treatment well Patient left: in chair;with call bell/phone within reach;with family/visitor present Nurse Communication: Mobility status PT Visit Diagnosis: Unsteadiness on feet (R26.81);Difficulty in walking, not elsewhere classified (R26.2);Other symptoms and signs involving the nervous system (R29.898)    Time: [id number] PT Time Calculation (min) (ACUTE ONLY): 40  min   Charges:   PT Evaluation $PT Eval Moderate Complexity: 1 Mod PT Treatments $Gait Training: 8-22 mins $Therapeutic Activity: 8-22 mins        Charlotte Crumbevon Donell Tomkins, PT DPT  Board Certified Neurologic Specialist Acute Rehabilitation Services Pager [phone] Office [phone]   Fabio AsaDevon J Jacquez Sheetz 04/23/2018, 8:59 AM

## 2018-04-23 NOTE — Evaluation (Signed)
Occupational Therapy Evaluation Patient Details Name: Cheryl Pearson MRN: [id number] DOB: 1941/04/12 Today's Date: 04/23/2018    History of Present Illness 77 y.o. female s/p thoracic fusion due to thoracic myelopathy. PMH: anxiety, HTN, arthritis.    Clinical Impression   PTA patient independent with self care.  Currently admitted for above and limited by problem list below, including impaired balance, pain, precaution adherence and safety. Educated on precautions, ADL compensatory techniques, recommendations, safety and mobility.  Patient currently requires min assist for grooming standing, setup assist for UB ADLs, min guard assist for LB ADLs, and min guard assist for transfers.  She requires max cueing to adhere to precautions, with poor compliance even after cueing. Patient will have 24/7 support of spouse at home. Based on performance today, recommend continued OT services while admitted and HHOT after dc in order to optimize safety and independence with ADLs and mobility.  Will continue to follow.     Follow Up Recommendations  Home health OT;Supervision/Assistance - 24 hour    Equipment Recommendations  None recommended by OT    Recommendations for Other Services       Precautions / Restrictions Precautions Precautions: Back Precaution Booklet Issued: Yes (comment) Precaution Comments: verbally reviewed (patient with poor carry over/compliance) Required Braces or Orthoses: (no brace needed order) Restrictions Weight Bearing Restrictions: No      Mobility Bed Mobility               General bed mobility comments: sitting in recliner upon entry  Transfers Overall transfer level: Needs assistance Equipment used: Rolling walker (2 wheeled) Transfers: Sit to/from Stand Sit to Stand: Min guard         General transfer comment: Min guard for safety, Max cues for compliance with precautions    Balance Overall balance assessment: History of Falls;Needs  assistance Sitting-balance support: No upper extremity supported;Feet supported Sitting balance-Leahy Scale: Good     Standing balance support: Bilateral upper extremity supported;During functional activity Standing balance-Leahy Scale: Fair Standing balance comment: reliant on UE support                           ADL either performed or assessed with clinical judgement   ADL Overall ADL's : Needs assistance/impaired     Grooming: Minimal assistance;Standing;Cueing for compensatory techniques Grooming Details (indicate cue type and reason): cueing for compensatory techniques, multiple cues to avoid foward lean with elbows on sink with poor compliance Upper Body Bathing: Set up;Sitting   Lower Body Bathing: Sit to/from stand;Cueing for compensatory techniques;Cueing for back precautions;Min guard   Upper Body Dressing : Set up;Sitting   Lower Body Dressing: Sit to/from stand;Cueing for back precautions;Cueing for compensatory techniques;Min guard Lower Body Dressing Details (indicate cue type and reason): pt demonstrates ability to complete figure 4 technique while seated to reach B feet Toilet Transfer: Ambulation;RW;Comfort height toilet;Grab bars;Min guard   Toileting- Clothing Manipulation and Hygiene: Cueing for compensatory techniques;Cueing for back precautions;Supervision/safety   Tub/ Shower Transfer: Min guard;Walk-in shower;Cueing for safety;Cueing for sequencing;Ambulation;3 in 1;Rolling walker Tub/Shower Transfer Details (indicate cue type and reason): simulated shower transfer with reverse step given min guard for safety, cueing for technique  Functional mobility during ADLs: Min guard;Rolling walker General ADL Comments: min guard for safety, cueing for precautions, and education provided on compensatory techniques      Vision   Vision Assessment?: No apparent visual deficits     Perception     Praxis  Pertinent Vitals/Pain Pain Assessment:  Faces Faces Pain Scale: Hurts even more Pain Location: back Pain Descriptors / Indicators: Discomfort;Guarding Pain Intervention(s): Monitored during session;Repositioned     Hand Dominance Right   Extremity/Trunk Assessment Upper Extremity Assessment Upper Extremity Assessment: Overall WFL for tasks assessed   Lower Extremity Assessment Lower Extremity Assessment: Defer to PT evaluation RLE Deficits / Details: noted RLE weakness reported per patient, appears functional upon testing grossly 4/5 all motions including dorsiflexion. no evidence of foot drop as previously indicated by patient RLE Sensation: history of peripheral neuropathy   Cervical / Trunk Assessment Cervical / Trunk Assessment: Other exceptions Cervical / Trunk Exceptions: s/p spinal surgery   Communication Communication Communication: No difficulties   Cognition Arousal/Alertness: Awake/alert Behavior During Therapy: Anxious Overall Cognitive Status: Impaired/Different from baseline Area of Impairment: Problem solving                             Problem Solving: Requires verbal cues;Requires tactile cues;Slow processing General Comments: requires increased time and effort for all activities, requires cueing for precautions with poor compliance    General Comments       Exercises     Shoulder Instructions      Home Living Family/patient expects to be discharged to:: Private residence Living Arrangements: Spouse/significant other Available Help at Discharge: Family Type of Home: House Home Access: Stairs to enter Secretary/administratorntrance Stairs-Number of Steps: 2 Entrance Stairs-Rails: Can reach both Home Layout: One level     Bathroom Shower/Tub: Chief Strategy OfficerTub/shower unit   Bathroom Toilet: Standard     Home Equipment: Scientist, forensicWalker - standard;Walker - 4 wheels;Bedside commode          Prior Functioning/Environment Level of Independence: Independent with assistive device(s)        Comments: uses an  standard RW, reports independent with ADLs        OT Problem List: Decreased strength;Decreased activity tolerance;Impaired balance (sitting and/or standing);Decreased safety awareness;Decreased knowledge of use of DME or AE;Decreased knowledge of precautions;Pain      OT Treatment/Interventions: Self-care/ADL training;Therapeutic exercise;Energy conservation;DME and/or AE instruction;Therapeutic activities;Balance training;Patient/family education    OT Goals(Current goals can be found in the care plan section) Acute Rehab OT Goals Patient Stated Goal: to get better OT Goal Formulation: With patient Potential to Achieve Goals: Good  OT Frequency: Min 2X/week   Barriers to D/C:            Co-evaluation              AM-PAC OT "6 Clicks" Daily Activity     Outcome Measure Help from another person eating meals?: None Help from another person taking care of personal grooming?: A Little Help from another person toileting, which includes using toliet, bedpan, or urinal?: A Little Help from another person bathing (including washing, rinsing, drying)?: A Little Help from another person to put on and taking off regular upper body clothing?: None Help from another person to put on and taking off regular lower body clothing?: A Little 6 Click Score: 20   End of Session Equipment Utilized During Treatment: Gait belt;Rolling walker Nurse Communication: Mobility status  Activity Tolerance: Patient tolerated treatment well Patient left: in chair;with call bell/phone within reach;with family/visitor present  OT Visit Diagnosis: Other abnormalities of gait and mobility (R26.89);Muscle weakness (generalized) (M62.81);Pain Pain - part of body: (back)                Time: [id number] OT Time Calculation (min):  24 min Charges:  OT General Charges $OT Visit: 1 Visit OT Evaluation $OT Eval Moderate Complexity: 1 Mod OT Treatments $Self Care/Home Management : 8-22 mins  Chancy Milroyhristie S  Kayliana Codd, OT Acute Rehabilitation Services Pager [phone] Office [phone]   Chancy MilroyChristie S Aliscia Clayton 04/23/2018, 9:26 AM

## 2018-04-23 NOTE — Consult Note (Signed)
Physical Medicine and Rehabilitation Consult Reason for Consult: Gait instability Referring Physician: Alyson Inglesostella, Vincent J, PA-C   HPI: Cheryl Pearson is a 77 y.o. female with past medical history of essential hypertension, arthritis, anxiety presented on 04/22/2018 for thoracic myelopathy status post decompression and stabilization T1-T2.  History taken from chart review, patient, and husband.  Patient had progressive right-sided leg weakness and instability times several weeks.  Husband states that patient initially was using a cane and required a walker, and most recently needed a wheelchair for mobility.  MRI ordered, reviewed, showing disc and facet degeneration at T1-T2.  MRI and C-spine also ordered, which was suggestive of myelopathy.  She underwent after mentioned procedure.  Hospital course complicated by HTN, steroid-induced hyperglycemia, postop pain, hyponatremia, leukocytosis.   Review of Systems  Constitutional: Positive for malaise/fatigue.  Respiratory: Positive for shortness of breath.   Gastrointestinal: Positive for constipation.  Genitourinary:       Incontinence  Musculoskeletal: Positive for back pain, joint pain and myalgias.  Neurological: Positive for tingling, focal weakness and weakness.  All other systems reviewed and are negative.  Past Medical History:  Diagnosis Date  . Anxiety   . Arthritis   . Colitis   . Early age-related macular degeneration   . Hernia   . Hypertension   . Infertility, female   . Menopausal symptoms   . Rosacea   . Thyroid disease 1968   hypothyroidism treated with meds for short time then all labs normal   Past Surgical History:  Procedure Laterality Date  . bone spur Left   . COLONOSCOPY  11/2011   polyp, ulcerative coliits inactive  . COLONOSCOPY  2005  . COLONOSCOPY  1970's   diagnosed with ulcerative colitis  . ESOPHAGOGASTRODUODENOSCOPY (EGD) WITH PROPOFOL N/A 11/20/2014   Procedure: ESOPHAGOGASTRODUODENOSCOPY  (EGD) WITH PROPOFOL;  Surgeon: Carman ChingJames Edwards, MD;  Location: [facility];  Service: Endoscopy;  Laterality: N/A;  . INGUINAL HERNIA REPAIR Right 7518 mos old  . LAPAROSCOPIC CHOLECYSTECTOMY  09/1998  . SAVORY DILATION N/A 11/20/2014   Procedure: SAVORY DILATION;  Surgeon: Carman ChingJames Edwards, MD;  Location: [facility];  Service: Endoscopy;  Laterality: N/A;  . WISDOM TOOTH EXTRACTION  1968   Family History  Problem Relation Age of Onset  . Cancer Mother        uterine cancer  . Osteoarthritis Mother   . Rheum arthritis Mother   . COPD Mother   . COPD Father   . COPD Brother   . Cancer Maternal Aunt        colon cancer   Social History:  reports that she has never smoked. She has never used smokeless tobacco. She reports current alcohol use of about 1.0 standard drinks of alcohol per week. She reports that she does not use drugs. Allergies:  Allergies  Allergen Reactions  . Yellow Jacket Venom [Bee Venom] Anaphylaxis  . Crestor [Rosuvastatin] Other (See Comments)    aches  . Lipitor [Atorvastatin] Other (See Comments)    Joint pain.  . Statins Other (See Comments)    aches  . Welchol [Colesevelam Hcl] Other (See Comments)    aches  . Zocor [Simvastatin] Other (See Comments)    aches   Medications Prior to Admission  Medication Sig Dispense Refill  . amLODipine (NORVASC) 2.5 MG tablet Take 2.5 mg by mouth daily.    Marland Kitchen. ezetimibe (ZETIA) 10 MG tablet Take 10 mg by mouth daily.    Marland Kitchen. ibuprofen (ADVIL,MOTRIN) 200 MG  tablet Take 200 mg by mouth every 6 (six) hours as needed for headache or moderate pain.     . Multiple Vitamins-Minerals (PRESERVISION AREDS 2 PO) Take 1 capsule by mouth 2 (two) times daily.    Marland Kitchen Propylene Glycol (SYSTANE BALANCE) 0.6 % SOLN Place 1-2 drops into both eyes daily as needed (for dry eyes).    Marland Kitchen EPINEPHrine (EPIPEN 2-PAK) 0.3 mg/0.3 mL IJ SOAJ injection Inject 0.3 mLs (0.3 mg total) into the muscle once. (Patient not taking: Reported on 04/13/2018) 1 Device 1     Home: Home Living Family/patient expects to be discharged to:: Private residence Living Arrangements: Spouse/significant other Available Help at Discharge: Family Type of Home: House Home Access: Stairs to enter Secretary/administrator of Steps: 2 Entrance Stairs-Rails: Can reach both Home Layout: One level Bathroom Shower/Tub: Engineer, manufacturing systems: Standard Home Equipment: Environmental consultant - standard, Environmental consultant - 4 wheels, Bedside commode  Functional History: Prior Function Level of Independence: Independent with assistive device(s) Comments: uses an standard RW, reports independent with ADLs Functional Status:  Mobility: Bed Mobility General bed mobility comments: sitting in recliner upon entry Transfers Overall transfer level: Needs assistance Equipment used: Rolling walker (2 wheeled) Transfers: Sit to/from Stand Sit to Stand: Min guard General transfer comment: Min guard for safety, Max cues for compliance with precautions Ambulation/Gait Ambulation/Gait assistance: Min guard Gait Distance (Feet): 160 Feet Assistive device: Rolling walker (2 wheeled) Gait Pattern/deviations: Step-through pattern, Decreased stride length, Shuffle, Drifts right/left General Gait Details: Patient cues for upright posture and positioning as well as increased cadence. Patient reluctant to comply with cues or precautions Gait velocity: decreased Gait velocity interpretation: <1.8 ft/sec, indicate of risk for recurrent falls    ADL: ADL Overall ADL's : Needs assistance/impaired Grooming: Minimal assistance, Standing, Cueing for compensatory techniques Grooming Details (indicate cue type and reason): cueing for compensatory techniques, multiple cues to avoid foward lean with elbows on sink with poor compliance Upper Body Bathing: Set up, Sitting Lower Body Bathing: Sit to/from stand, Cueing for compensatory techniques, Cueing for back precautions, Min guard Upper Body Dressing : Set up,  Sitting Lower Body Dressing: Sit to/from stand, Cueing for back precautions, Cueing for compensatory techniques, Min guard Lower Body Dressing Details (indicate cue type and reason): pt demonstrates ability to complete figure 4 technique while seated to reach B feet Toilet Transfer: Ambulation, RW, Comfort height toilet, Grab bars, Min guard Toileting- Clothing Manipulation and Hygiene: Cueing for compensatory techniques, Cueing for back precautions, Supervision/safety Tub/ Shower Transfer: Min guard, Walk-in shower, Cueing for safety, Cueing for sequencing, Ambulation, 3 in 1, Rolling walker Tub/Shower Transfer Details (indicate cue type and reason): simulated shower transfer with reverse step given min guard for safety, cueing for technique  Functional mobility during ADLs: Min guard, Rolling walker General ADL Comments: min guard for safety, cueing for precautions, and education provided on compensatory techniques   Cognition: Cognition Overall Cognitive Status: Impaired/Different from baseline Orientation Level: Oriented X4 Cognition Arousal/Alertness: Awake/alert Behavior During Therapy: Anxious Overall Cognitive Status: Impaired/Different from baseline Area of Impairment: Problem solving Problem Solving: Requires verbal cues, Requires tactile cues, Slow processing General Comments: requires increased time and effort for all activities, requires cueing for precautions with poor compliance   Blood pressure (!) 150/52, pulse 89, temperature 98.7 F (37.1 C), temperature source Oral, resp. rate 16, height 5\' 5"  (1.651 m), weight 72.6 kg, SpO2 98 %. Physical Exam  Constitutional: She appears well-developed and well-nourished.  HENT:  Head: Normocephalic and atraumatic.  Eyes: EOM  are normal. Right eye exhibits no discharge. Left eye exhibits no discharge.  Neck: Normal range of motion. Neck supple.  Cardiovascular: Normal rate and regular rhythm.  Respiratory: Effort normal and breath  sounds normal.  GI: Soft. Bowel sounds are normal.  Musculoskeletal:     Comments: No edema or tenderness in extremities  Neurological: She is alert.  Motor: Left upper extremity: 5/5 proximal distal Right upper extremity: 4/5 proximal distal Left lower extremity: Hip flexion 4/5, knee extension 4+/5, ankle dorsiflexion 5/5 Right lower extremity: Hip flexion 2+/5, knee extension 2+/5, ankle dorsiflexion 4/5 Sensation intact light touch  Skin: Skin is warm and dry.  Psychiatric: She has a normal mood and affect. Her behavior is normal.    Results for orders placed or performed during the hospital encounter of 04/22/18 (from the past 24 hour(s))  CBC     Status: Abnormal   Collection Time: 04/23/18  9:11 AM  Result Value Ref Range   WBC 18.7 (H) 4.0 - 10.5 K/uL   RBC 4.14 3.87 - 5.11 MIL/uL   Hemoglobin 12.4 12.0 - 15.0 g/dL   HCT 16.1 09.6 - 04.5 %   MCV 91.1 80.0 - 100.0 fL   MCH 30.0 26.0 - 34.0 pg   MCHC 32.9 30.0 - 36.0 g/dL   RDW 40.9 81.1 - 91.4 %   Platelets 363 150 - 400 K/uL   nRBC 0.0 0.0 - 0.2 %  Basic metabolic panel     Status: Abnormal   Collection Time: 04/23/18  9:11 AM  Result Value Ref Range   Sodium 132 (L) 135 - 145 mmol/L   Potassium 4.2 3.5 - 5.1 mmol/L   Chloride 96 (L) 98 - 111 mmol/L   CO2 26 22 - 32 mmol/L   Glucose, Bld 148 (H) 70 - 99 mg/dL   BUN 17 8 - 23 mg/dL   Creatinine, Ser 7.82 0.44 - 1.00 mg/dL   Calcium 9.4 8.9 - 95.6 mg/dL   GFR calc non Af Amer >60 >60 mL/min   GFR calc Af Amer >60 >60 mL/min   Anion gap 10 5 - 15   Dg Thoracic Spine 2 View  Result Date: 04/22/2018 CLINICAL DATA:  Posterior spinal fusion at T1-2 EXAM: DG C-ARM 61-120 MIN; THORACIC SPINE 2 VIEWS COMPARISON:  None FLUOROSCOPY TIME:  21 seconds FINDINGS: Two intraoperative fluoroscopic spot images demonstrate posterior spinal fusion at T1-2. IMPRESSION: Intraoperative localization. Electronically Signed   By: Elige Ko   On: 04/22/2018 15:39   Dg C-arm 1-60  Min  Result Date: 04/22/2018 CLINICAL DATA:  Posterior spinal fusion at T1-2 EXAM: DG C-ARM 61-120 MIN; THORACIC SPINE 2 VIEWS COMPARISON:  None FLUOROSCOPY TIME:  21 seconds FINDINGS: Two intraoperative fluoroscopic spot images demonstrate posterior spinal fusion at T1-2. IMPRESSION: Intraoperative localization. Electronically Signed   By: Elige Ko   On: 04/22/2018 15:39    Assessment/Plan: Diagnosis: Cervical and thoracic myelopathy status post thoracic decompression Labs and images (see above) independently reviewed.  Records reviewed and summated above.  1. Does the need for close, 24 hr/day medical supervision in concert with the patient's rehab needs make it unreasonable for this patient to be served in a less intensive setting? No  2. Co-Morbidities requiring supervision/potential complications: HTN (monitor and provide prns in accordance with increased physical exertion and pain), steroid-induced hyperglycemia (Monitor in accordance with exercise and adjust meds as necessary), postop pain (Biofeedback training with therapies to help reduce reliance on opiate pain medications, monitor pain control during  therapies, and sedation at rest and titrate to maximum efficacy to ensure participation and gains in therapies), hyponatremia (continue to monitor, treat if necessary), leukocytosis (repeat labs, cont to monitor for signs and symptoms of infection, further workup if indicated) 3. Due to safety, skin/wound care, disease management, pain management and patient education, does the patient require 24 hr/day rehab nursing? No 4. Does the patient require coordinated care of a physician, rehab nurse, PT (1-2 hrs/day, 5 days/week) and OT (1-2 hrs/day, 5 days/week) to address physical and functional deficits in the context of the above medical diagnosis(es)? No Addressing deficits in the following areas: balance, endurance, locomotion, strength, transferring, bathing, dressing, toileting and  psychosocial support 5. Can the patient actively participate in an intensive therapy program of at least 3 hrs of therapy per day at least 5 days per week? Yes 6. The potential for patient to make measurable gains while on inpatient rehab is good and fair 7. Anticipated functional outcomes upon discharge from inpatient rehab are supervision  with PT, supervision with OT, n/a with SLP. 8. Estimated rehab length of stay to reach the above functional goals is: 12-15 days. 9. Anticipated D/C setting: Home 10. Anticipated post D/C treatments: HH therapy and Home excercise program 11. Overall Rehab/Functional Prognosis: good  RECOMMENDATIONS: This patient's condition is appropriate for continued rehabilitative care in the following setting: Patient doing well with PT/OT on day evaluation and will not require CIR.  Recommend home with home health.  Would also consider/discuss plans for cervical myelopathy. Patient has agreed to participate in recommended program. Potentially Note that insurance prior authorization may be required for reimbursement for recommended care.  Comment: Rehab Admissions Coordinator to follow up.   Maryla MorrowAnkit Jamesina Gaugh, MD, Evert KohlABPMR 04/23/2018

## 2018-04-24 MED ORDER — DEXAMETHASONE SODIUM PHOSPHATE 4 MG/ML IJ SOLN
4.0000 mg | Freq: Four times a day (QID) | INTRAMUSCULAR | Status: AC
  Administered 2018-04-24 – 2018-04-25 (×4): 4 mg via INTRAVENOUS
  Filled 2018-04-24 (×6): qty 1

## 2018-04-24 NOTE — Progress Notes (Signed)
Physical Therapy Treatment Patient Details Name: Cheryl LaineLila F Pearson MRN: [id number] DOB: 10/24/1940 Today's Date: 04/24/2018    History of Present Illness 77 y.o. female s/p thoracic fusion due to thoracic myelopathy. PMH: anxiety, HTN, arthritis.     PT Comments    Continuing work on functional mobility and activity tolerance;  Noting significantly less stability with amb than yesterday's session, requiring heavy mod assist and close guard and at times knee block on the right for stability in stance; performed 3 bouts of amb about 10 feet each with seated rest breaks in between; WaldoWalked with chair behind for safety;   Noted Rehab Consult already done, and recommending Home with HHPT; Based on her performance today, it is very much worth considering asking CIR to take another look at [name]; she cannot go home at her current functional level -- Will continue to monitor for progress  Follow Up Recommendations  CIR;Supervision/Assistance - 24 hour;Other (comment)(Will monitor progress)     Equipment Recommendations  Rolling walker with 5" wheels;3in1 (PT)    Recommendations for Other Services       Precautions / Restrictions Precautions Precautions: Back Precaution Comments: Will need continuing reinforcement    Mobility  Bed Mobility               General bed mobility comments: sitting in recliner upon entry  Transfers Overall transfer level: Needs assistance Equipment used: Rolling walker (2 wheeled) Transfers: Sit to/from Stand Sit to Stand: Mod assist;+2 safety/equipment Stand pivot transfers: Min assist       General transfer comment: Light mod assist to power up  Ambulation/Gait Ambulation/Gait assistance: Mod assist;+2 safety/equipment Gait Distance (Feet): 30 Feet(3x10) Assistive device: Rolling walker (2 wheeled) Gait Pattern/deviations: Step-through pattern;Decreased stride length;Shuffle;Drifts right/left Gait velocity: decreased   General Gait Details:  Noting significant instability in R stance today, requiring physical knee block in stance for stabilty; multimodal cues to activate quad and gluteal in stance; occasional physica lassist to advance R LE; Husband pushed chair behind; Fatigues after about 3-5 minutes in standing   Stairs             Wheelchair Mobility    Modified Rankin (Stroke Patients Only)       Balance Overall balance assessment: Needs assistance           Standing balance-Leahy Scale: Poor Standing balance comment: reliant on UE support                            Cognition Arousal/Alertness: Awake/alert Behavior During Therapy: Flat affect Overall Cognitive Status: Impaired/Different from baseline Area of Impairment: Problem solving                             Problem Solving: Requires verbal cues;Requires tactile cues;Slow processing General Comments: negative verbiage, comparing previous to current level of function. lots of coaxing and encouragement " i dont like to be told what to do"      Exercises      General Comments General comments (skin integrity, edema, etc.): Much less stable in upright activity and needing significantly more assist with amb today than last session; May be worth haveing CIR take a second look      Pertinent Vitals/Pain Pain Assessment: Faces Faces Pain Scale: Hurts little more Pain Location: back Pain Descriptors / Indicators: Discomfort;Guarding Pain Intervention(s): Monitored during session    Home Living  Prior Function            PT Goals (current goals can now be found in the care plan section) Acute Rehab PT Goals Patient Stated Goal: to get better PT Goal Formulation: With patient/family Time For Goal Achievement: 05/07/18 Potential to Achieve Goals: Good Progress towards PT goals: Not progressing toward goals - comment(RLE more weak; significantly less steady than yest)    Frequency     Min 5X/week      PT Plan Discharge plan needs to be updated    Co-evaluation              AM-PAC PT "6 Clicks" Mobility   Outcome Measure  Help needed turning from your back to your side while in a flat bed without using bedrails?: A Little Help needed moving from lying on your back to sitting on the side of a flat bed without using bedrails?: A Little Help needed moving to and from a bed to a chair (including a wheelchair)?: A Lot Help needed standing up from a chair using your arms (e.g., wheelchair or bedside chair)?: A Lot Help needed to walk in hospital room?: A Lot Help needed climbing 3-5 steps with a railing? : Total 6 Click Score: 13    End of Session Equipment Utilized During Treatment: Gait belt Activity Tolerance: Patient tolerated treatment well Patient left: in chair;with call bell/phone within reach;with family/visitor present Nurse Communication: Mobility status PT Visit Diagnosis: Unsteadiness on feet (R26.81);Difficulty in walking, not elsewhere classified (R26.2);Other symptoms and signs involving the nervous system (R29.898)     Time: [id number] PT Time Calculation (min) (ACUTE ONLY): 43 min  Charges:  $Gait Training: 23-37 mins $Therapeutic Activity: 8-22 mins                     Van ClinesHolly Zariah Jost, PT  Acute Rehabilitation Services Pager [phone] Office [phone]    Levi AlandHolly H Tersea Aulds 04/24/2018, 12:15 PM

## 2018-04-24 NOTE — Progress Notes (Signed)
Patient ID: Cheryl Pearson, female   DOB: 04/30/1940, 77 y.o.   MRN: [id number] Patient seen and examined.  Has appropriate soreness.  Dressing dry.  Left lower extremity 5 out of 5.  Right lower extremity 2/5 HF, 3-4-/ 5 KE and PF, very little dorsiflexion.  Required more assistance to get from bed to chair this morning.  Working with therapy right now.  Unclear if this right lower extremity weakness is worse as I cannot find a detailed preoperative neurologic exam.  She complains of numbness in the right leg but states that is stable.  Will try some Decadron.  Not sure MRI is absolutely necessary at this time.  I suspect if she had a hematoma she would have increased weakness in both legs.  I suspect this weakness may be stable.  She is 48 hours postop.

## 2018-04-24 NOTE — Progress Notes (Signed)
Pt awake and very agitated due to not being able to find her call light. This RN was rounding and she stated she needed to use the restroom. Pt was able to stand with walker and use bedside commode with 2 assist. Pt frustrated because she feels like she is not mobilizing well explained this is a process. Pt stated she felt dizzy and SOB. BP 180/77 SPO2 96%. Pt states pain is 10/10. Pain medicine given. Will recheck pt's BP and will continue to monitor pt.

## 2018-04-24 NOTE — Progress Notes (Signed)
Occupational Therapy Treatment Patient Details Name: Waldo LaineLila F Larmore MRN: [id number] DOB: 07/18/1940 Today's Date: 04/24/2018    History of present illness 77 y.o. female s/p thoracic fusion due to thoracic myelopathy. PMH: anxiety, HTN, arthritis.    OT comments  Pt. Able to complete multiple sit/stand in preparation for ambulation to the b.room for toileting tasks.  Moving slowly today with transfers and ambulation stating lack of active movement in RLE as main contributing factor.  Required coaxing and encouragement for pushing participation with skilled therapy. Focus of next session will be to introduce UB/LB ADLs with focus on maintaining precautions during completion.  Follow Up Recommendations  Home health OT;Supervision/Assistance - 24 hour    Equipment Recommendations  None recommended by OT    Recommendations for Other Services      Precautions / Restrictions Precautions Precautions: Back Precaution Comments: Pt. able to recall 2/3, some coaxing for no bending Restrictions Weight Bearing Restrictions: No       Mobility Bed Mobility               General bed mobility comments: sitting in recliner upon entry  Transfers Overall transfer level: Needs assistance Equipment used: Rolling walker (2 wheeled) Transfers: Sit to/from Stand;Stand Pivot Transfers Sit to Stand: Min assist Stand pivot transfers: Min assist       General transfer comment: Min assist for safety, Max cues for compliance with precautions-increased time to transition into standing, staying in flexed knee position, difficult to come upright then able to maintain once in standing    Balance                                           ADL either performed or assessed with clinical judgement   ADL Overall ADL's : Needs assistance/impaired                         Toilet Transfer: Ambulation;RW;Comfort height toilet;Min guard;BSC Toilet Transfer Details (indicate cue  type and reason): 3n1 over the commode         Functional mobility during ADLs: Min guard;Rolling walker General ADL Comments: min guard for safety, cueing for precautions, pt. moving VERY slow     Vision       Perception     Praxis      Cognition Arousal/Alertness: Awake/alert Behavior During Therapy: Flat affect                                   General Comments: negative verbiage, comparing previous to current level of function. lots of coaxing and encouragement " i dont like to be told what to do"        Exercises     Shoulder Instructions       General Comments      Pertinent Vitals/ Pain       Faces Pain Scale: Hurts little more Pain Location: back Pain Descriptors / Indicators: Discomfort;Guarding Pain Intervention(s): Limited activity within patient's tolerance;Monitored during session  Home Living                                          Prior Functioning/Environment  Frequency  Min 2X/week        Progress Toward Goals  OT Goals(current goals can now be found in the care plan section)  Progress towards OT goals: Progressing toward goals     Plan      Co-evaluation                 AM-PAC OT "6 Clicks" Daily Activity     Outcome Measure   Help from another person eating meals?: None Help from another person taking care of personal grooming?: A Little Help from another person toileting, which includes using toliet, bedpan, or urinal?: A Little Help from another person bathing (including washing, rinsing, drying)?: A Little Help from another person to put on and taking off regular upper body clothing?: None Help from another person to put on and taking off regular lower body clothing?: A Little 6 Click Score: 20    End of Session Equipment Utilized During Treatment: Gait belt;Rolling walker  OT Visit Diagnosis: Other abnormalities of gait and mobility (R26.89);Muscle weakness  (generalized) (M62.81);Pain   Activity Tolerance Patient tolerated treatment well   Patient Left Other (comment)(left in b.room on commode with inst. to pull the string. notified CNA of pts. location)   Nurse Communication Other (comment)(spoke with CNA about pt. being in the b.room)        Time: [id number] OT Time Calculation (min): 27 min  Charges: OT General Charges $OT Visit: 1 Visit OT Treatments $Self Care/Home Management : 23-37 mins   Robet LeuMorris, Yassir Enis Lorraine, COTA/L 04/24/2018, 9:37 AM

## 2018-04-24 NOTE — Plan of Care (Signed)
  Problem: Education: Goal: Ability to verbalize activity precautions or restrictions will improve Outcome: Progressing Goal: Understanding of discharge needs will improve Outcome: Progressing   Problem: Activity: Goal: Ability to avoid complications of mobility impairment will improve Outcome: Progressing Goal: Ability to tolerate increased activity will improve Outcome: Progressing Goal: Will remain free from falls Outcome: Progressing   Problem: Pain Management: Goal: Pain level will decrease Outcome: Progressing

## 2018-04-25 NOTE — Progress Notes (Signed)
Physical Therapy Treatment Patient Details Name: Cheryl Pearson MRN: [id number] DOB: 04/30/1940 Today's Date: 04/25/2018    History of Present Illness 77 y.o. female s/p thoracic fusion due to thoracic myelopathy. PMH: anxiety, HTN, arthritis.     PT Comments    Continuing work on functional mobility and activity tolerance;  Noting improvements in endurance, gait distance, and modest improvements in R knee stance stability;  Still requires close guard of R knee for safety, and she buckled a few times during amb, requiring knee block for safety, and mod assist on occasion to prevent fall;  Also with occasional genu recurvatum in stance R knee;  Assist to advance RLE due to decr dorsiflexion -- will need to consider some form of dorsiflexion assist.  I still favor CIR for more intensive therapies to maximize independence and safety with mobility prior to dc home; Her husband informed me that she has an "accululation problem", and that it is highly unlikely she will let anyone from Home Health into her house; this underscores the need for her be much more stable before getting home  Follow Up Recommendations  CIR;Supervision/Assistance - 24 hour;Other (comment)(Will monitor progress)     Equipment Recommendations  Rolling walker with 5" wheels;3in1 (PT);Other (comment)(Will need to consider some form of AFO)    Recommendations for Other Services       Precautions / Restrictions Precautions Precautions: Back Precaution Comments: Will need continuing reinforcement of back precautions; noting not a lot of carryover from yesterday's session Required Braces or Orthoses: (no brace needed order)    Mobility  Bed Mobility                  Transfers Overall transfer level: Needs assistance Equipment used: Rolling walker (2 wheeled) Transfers: Sit to/from Stand Sit to Stand: Min assist         General transfer comment: min assist to steady; cues for hand placement and  safety  Ambulation/Gait Ambulation/Gait assistance: Min assist;Mod assist;+2 safety/equipment Gait Distance (Feet): (Hallway ambulation) Assistive device: Rolling walker (2 wheeled) Gait Pattern/deviations: Step-through pattern;Decreased dorsiflexion - right;Drifts right/left     General Gait Details: Improvements in gait distance and activity tolerance over yesterday; Still with decr dorsiflexion R, requires assist for RLE advancement approx 50% of steps; close guard of R knee for stabiltiy in stance; Less buckling than previous session, but still with 5-6 instances of R knee buckle, requiring mod assist to prevent fall; 2 person assist for safety, and husband pushed chair behind   Stairs             Wheelchair Mobility    Modified Rankin (Stroke Patients Only)       Balance     Sitting balance-Leahy Scale: Good       Standing balance-Leahy Scale: Poor                              Cognition Arousal/Alertness: Awake/alert Behavior During Therapy: WFL for tasks assessed/performed Overall Cognitive Status: Impaired/Different from baseline Area of Impairment: Memory;Problem solving                     Memory: Decreased recall of precautions;Decreased short-term memory       Problem Solving: Requires verbal cues;Requires tactile cues;Slow processing General Comments: Pt indicated she did not remember aspects of yesterday's PT session      Exercises      General Comments  Pertinent Vitals/Pain Pain Assessment: 0-10 Faces Pain Scale: Hurts little more Pain Location: back Pain Descriptors / Indicators: Discomfort;Guarding Pain Intervention(s): Monitored during session    Home Living                      Prior Function            PT Goals (current goals can now be found in the care plan section) Acute Rehab PT Goals Patient Stated Goal: to get better PT Goal Formulation: With patient/family Time For Goal  Achievement: 05/07/18 Potential to Achieve Goals: Good Progress towards PT goals: Progressing toward goals(Improved from yesterday)    Frequency    Min 5X/week      PT Plan Current plan remains appropriate    Co-evaluation              AM-PAC PT "6 Clicks" Mobility   Outcome Measure  Help needed turning from your back to your side while in a flat bed without using bedrails?: A Little Help needed moving from lying on your back to sitting on the side of a flat bed without using bedrails?: A Little Help needed moving to and from a bed to a chair (including a wheelchair)?: A Little Help needed standing up from a chair using your arms (e.g., wheelchair or bedside chair)?: A Little Help needed to walk in hospital room?: A Lot Help needed climbing 3-5 steps with a railing? : Total 6 Click Score: 15    End of Session Equipment Utilized During Treatment: Gait belt Activity Tolerance: Patient tolerated treatment well Patient left: in chair;with call bell/phone within reach;with family/visitor present Nurse Communication: Mobility status PT Visit Diagnosis: Unsteadiness on feet (R26.81);Difficulty in walking, not elsewhere classified (R26.2);Other symptoms and signs involving the nervous system (R29.898)     Time: 1333-1400 PT Time Calculation (min) (ACUTE ONLY): 27 min  Charges:  $Gait Training: 23-37 mins                     Van ClinesHolly Jeananne Bedwell, [place]PT  Acute Rehabilitation Services Pager [phone] Office [phone]    Levi AlandHolly H [facility] [place] 04/25/2018, 4:05 PM

## 2018-04-25 NOTE — Care Management Important Message (Signed)
Important Message  Patient Details  Name: Cheryl LaineLila F Mells MRN: [id number] Date of Birth: 04/13/1941   Medicare Important Message Given:  Yes    Brantly Kalman 04/25/2018, 4:20 PM

## 2018-04-25 NOTE — Progress Notes (Signed)
  NEUROSURGERY PROGRESS NOTE   No issues overnight. Difficult day yesterday with ambulation, but subjectively feels stronger this am RLE strength without significant change Mild soreness this am  EXAM:  BP 132/76 (BP Location: Right Arm)   Pulse 98   Temp 98.1 F (36.7 C) (Oral)   Resp 18   Ht 5\' 5"  (1.651 m)   Wt 72.6 kg   SpO2 98%   BMI 26.63 kg/m   Awake, alert, oriented  Speech fluent, appropriate  CN grossly intact  5/5 BUE and LLE RLE 3-4-/5 with exception of PF/DF 2/5, 1/5  PLAN Stable strength this am Deemed not a candidate by CIR yesterday, but PT/OT continue to rec based on difficult day yesterday. Will ask them to reassess

## 2018-04-25 NOTE — Plan of Care (Signed)
  Problem: Education: Goal: Ability to verbalize activity precautions or restrictions will improve Outcome: Progressing   Problem: Activity: Goal: Ability to avoid complications of mobility impairment will improve Outcome: Progressing Goal: Will remain free from falls Outcome: Progressing   Problem: Clinical Measurements: Goal: Ability to maintain clinical measurements within normal limits will improve Outcome: Progressing   Problem: Pain Management: Goal: Pain level will decrease Outcome: Progressing

## 2018-04-26 ENCOUNTER — Encounter (HOSPITAL_COMMUNITY): Payer: Self-pay

## 2018-04-26 ENCOUNTER — Other Ambulatory Visit: Payer: Self-pay

## 2018-04-26 ENCOUNTER — Inpatient Hospital Stay (HOSPITAL_COMMUNITY)
Admission: RE | Admit: 2018-04-26 | Discharge: 2018-05-06 | DRG: 561 | Disposition: A | Discharge status: Home / Self Care | Payer: Medicare Other | Source: Intra-hospital | Attending: Physical Medicine & Rehabilitation | Admitting: Physical Medicine & Rehabilitation

## 2018-04-26 DIAGNOSIS — E785 Hyperlipidemia, unspecified: Secondary | ICD-10-CM | POA: Diagnosis present

## 2018-04-26 DIAGNOSIS — E039 Hypothyroidism, unspecified: Secondary | ICD-10-CM | POA: Diagnosis present

## 2018-04-26 DIAGNOSIS — K5900 Constipation, unspecified: Secondary | ICD-10-CM | POA: Diagnosis present

## 2018-04-26 DIAGNOSIS — Z79899 Other long term (current) drug therapy: Secondary | ICD-10-CM

## 2018-04-26 DIAGNOSIS — R32 Unspecified urinary incontinence: Secondary | ICD-10-CM | POA: Diagnosis not present

## 2018-04-26 DIAGNOSIS — M21371 Foot drop, right foot: Secondary | ICD-10-CM | POA: Diagnosis present

## 2018-04-26 DIAGNOSIS — I10 Essential (primary) hypertension: Secondary | ICD-10-CM | POA: Diagnosis present

## 2018-04-26 DIAGNOSIS — M4714 Other spondylosis with myelopathy, thoracic region: Secondary | ICD-10-CM | POA: Diagnosis not present

## 2018-04-26 DIAGNOSIS — Z4789 Encounter for other orthopedic aftercare: Secondary | ICD-10-CM | POA: Diagnosis present

## 2018-04-26 DIAGNOSIS — G8220 Paraplegia, unspecified: Secondary | ICD-10-CM | POA: Diagnosis not present

## 2018-04-26 DIAGNOSIS — M7989 Other specified soft tissue disorders: Secondary | ICD-10-CM | POA: Diagnosis not present

## 2018-04-26 DIAGNOSIS — R748 Abnormal levels of other serum enzymes: Secondary | ICD-10-CM | POA: Diagnosis not present

## 2018-04-26 DIAGNOSIS — E8809 Other disorders of plasma-protein metabolism, not elsewhere classified: Secondary | ICD-10-CM | POA: Diagnosis present

## 2018-04-26 MED ORDER — POLYVINYL ALCOHOL 1.4 % OP SOLN
1.0000 [drp] | Freq: Every day | OPHTHALMIC | Status: DC | PRN
  Filled 2018-04-26: qty 15

## 2018-04-26 MED ORDER — METHYLPREDNISOLONE 4 MG PO TBPK: ORAL_TABLET | ORAL | 0 refills | Status: DC

## 2018-04-26 MED ORDER — DEXAMETHASONE SODIUM PHOSPHATE 4 MG/ML IJ SOLN
4.0000 mg | Freq: Two times a day (BID) | INTRAMUSCULAR | Status: DC
  Administered 2018-04-26: 4 mg via INTRAVENOUS
  Filled 2018-04-26 (×2): qty 1

## 2018-04-26 MED ORDER — SENNA 8.6 MG PO TABS
1.0000 | ORAL_TABLET | Freq: Two times a day (BID) | ORAL | Status: DC
  Administered 2018-04-26 – 2018-05-06 (×11): 8.6 mg via ORAL
  Filled 2018-04-26 (×19): qty 1

## 2018-04-26 MED ORDER — METHOCARBAMOL 1000 MG/10ML IJ SOLN
750.0000 mg | Freq: Four times a day (QID) | INTRAVENOUS | Status: DC | PRN
  Filled 2018-04-26: qty 7.5

## 2018-04-26 MED ORDER — DEXAMETHASONE 4 MG PO TABS
4.0000 mg | ORAL_TABLET | Freq: Two times a day (BID) | ORAL | Status: DC
  Administered 2018-04-26 – 2018-05-06 (×20): 4 mg via ORAL
  Filled 2018-04-26 (×20): qty 1

## 2018-04-26 MED ORDER — ACETAMINOPHEN 325 MG PO TABS
650.0000 mg | ORAL_TABLET | ORAL | Status: DC | PRN
  Administered 2018-04-26 – 2018-05-02 (×7): 650 mg via ORAL
  Filled 2018-04-26 (×11): qty 2

## 2018-04-26 MED ORDER — AMLODIPINE BESYLATE 2.5 MG PO TABS
2.5000 mg | ORAL_TABLET | Freq: Every day | ORAL | Status: DC
  Administered 2018-04-27 – 2018-05-06 (×10): 2.5 mg via ORAL
  Filled 2018-04-26 (×10): qty 1

## 2018-04-26 MED ORDER — ONDANSETRON HCL 4 MG/2ML IJ SOLN: 4.0000 mg | Freq: Four times a day (QID) | INTRAMUSCULAR | Status: DC | PRN

## 2018-04-26 MED ORDER — SORBITOL 70 % SOLN: 30.0000 mL | Freq: Every day | Status: DC | PRN

## 2018-04-26 MED ORDER — POLYETHYLENE GLYCOL 3350 17 G PO PACK: 17.0000 g | PACK | Freq: Every day | ORAL | Status: DC | PRN

## 2018-04-26 MED ORDER — OXYCODONE HCL 5 MG PO TABS
10.0000 mg | ORAL_TABLET | ORAL | Status: DC | PRN
  Administered 2018-04-27 – 2018-05-05 (×2): 10 mg via ORAL
  Filled 2018-04-26 (×7): qty 2

## 2018-04-26 MED ORDER — EZETIMIBE 10 MG PO TABS
10.0000 mg | ORAL_TABLET | Freq: Every day | ORAL | Status: DC
  Administered 2018-04-27 – 2018-05-06 (×10): 10 mg via ORAL
  Filled 2018-04-26 (×11): qty 1

## 2018-04-26 MED ORDER — BISACODYL 10 MG RE SUPP: 10.0000 mg | Freq: Every day | RECTAL | Status: DC | PRN

## 2018-04-26 MED ORDER — PROSIGHT PO TABS
1.0000 | ORAL_TABLET | Freq: Every day | ORAL | Status: DC
  Filled 2018-04-26: qty 1

## 2018-04-26 MED ORDER — OXYCODONE-ACETAMINOPHEN 7.5-325 MG PO TABS: 1.0000 | ORAL_TABLET | ORAL | 0 refills | Status: DC | PRN

## 2018-04-26 MED ORDER — ACETAMINOPHEN 650 MG RE SUPP: 650.0000 mg | RECTAL | Status: DC | PRN

## 2018-04-26 MED ORDER — PANTOPRAZOLE SODIUM 40 MG PO TBEC
40.0000 mg | DELAYED_RELEASE_TABLET | Freq: Every day | ORAL | Status: DC
  Administered 2018-04-26 – 2018-05-05 (×10): 40 mg via ORAL
  Filled 2018-04-26 (×10): qty 1

## 2018-04-26 MED ORDER — ONDANSETRON HCL 4 MG PO TABS: 4.0000 mg | ORAL_TABLET | Freq: Four times a day (QID) | ORAL | Status: DC | PRN

## 2018-04-26 MED ORDER — OXYCODONE HCL 5 MG PO TABS
5.0000 mg | ORAL_TABLET | ORAL | Status: DC | PRN
  Administered 2018-04-27 – 2018-05-06 (×18): 5 mg via ORAL
  Filled 2018-04-26 (×13): qty 1

## 2018-04-26 MED ORDER — METHOCARBAMOL 750 MG PO TABS
750.0000 mg | ORAL_TABLET | Freq: Four times a day (QID) | ORAL | Status: DC | PRN
  Administered 2018-04-27 – 2018-04-29 (×7): 750 mg via ORAL
  Filled 2018-04-26 (×7): qty 1

## 2018-04-26 NOTE — Plan of Care (Signed)
Problem: Education: Goal: Knowledge of the prescribed therapeutic regimen will improve Outcome: Progressing   Problem: Pain Management: Goal: Pain level will decrease Outcome: Progressing   Problem: Skin Integrity: Goal: Will show signs of wound healing Outcome: Progressing

## 2018-04-26 NOTE — Discharge Summary (Signed)
Physician Discharge Summary  Patient ID: Cheryl Pearson MRN: [id number] DOB/AGE: 1941-02-06 77 y.o.  Admit date: 04/22/2018 Discharge date: 04/26/2018  Admission Diagnoses:  Thoracic myelopathy  Discharge Diagnoses:  Same Active Problems:   Thoracic myelopathy   Essential hypertension   Cervical spondylosis without myelopathy   Steroid-induced hyperglycemia   Postoperative pain   Hyponatremia   Leucocytosis   Discharged Condition: Stable  Hospital Course:  Cheryl Pearson is a 77 y.o. female who was admitted for the below procedure. There were no post operative complications. At time of discharge, pain was well controlled, ambulating with Pt/OT, tolerating po, voiding normal. Ready for discharge.  Treatments: Surgery 1. T1-T2 laminectomy for decompression of spinal cord 2. Nonsegmental instrumentation using pedicle screws at T1-T2 -[employer] Solera 4.5 x 35 mm screws x4 3. Posterolateral arthrodesis, T1-T2 4. Use of morselized bone autograft harvested using the same incision  Discharge Exam: Blood pressure (!) 128/53, pulse 88, temperature 98.4 F (36.9 C), temperature source Oral, resp. rate 17, height 5\' 5"  (1.651 m), weight 72.6 kg, SpO2 97 %. Awake, alert, oriented  Speech fluent, appropriate  CN grossly intact 5/5 BUE and LLE RLE 3-4-/5 with exception of PF/DF 2/5, 1/5  Disposition: Discharge disposition: [facility] Not Defined       Discharge Instructions    Call MD for:  difficulty breathing, headache or visual disturbances   Complete by:  As directed    Call MD for:  persistant dizziness or light-headedness   Complete by:  As directed    Call MD for:  redness, tenderness, or signs of infection (pain, swelling, redness, odor or green/yellow discharge around incision site)   Complete by:  As directed    Call MD for:  severe uncontrolled pain   Complete by:  As directed    Call MD for:  temperature >100.4   Complete by:  As  directed    Diet general   Complete by:  As directed    Driving Restrictions   Complete by:  As directed    Do not drive until given clearance.   Increase activity slowly   Complete by:  As directed    Lifting restrictions   Complete by:  As directed    Do not lift anything >10lbs. Avoid bending and twisting in awkward positions. Avoid bending at the back.   May shower / Bathe   Complete by:  As directed    In 24 hours. Okay to wash wound with warm soapy water. Avoid scrubbing the wound. Pat dry.   Remove dressing in 24 hours   Complete by:  As directed      Allergies as of 04/26/2018      Reactions   Yellow Jacket Venom [bee Venom] Anaphylaxis   Crestor [rosuvastatin] Other (See Comments)   aches   Lipitor [atorvastatin] Other (See Comments)   Joint pain.   Statins Other (See Comments)   aches   Welchol [colesevelam Hcl] Other (See Comments)   aches   Zocor [simvastatin] Other (See Comments)   aches      Medication List    TAKE these medications   amLODipine 2.5 MG tablet Commonly known as:  NORVASC Take 2.5 mg by mouth daily.   EPINEPHrine 0.3 mg/0.3 mL Soaj injection Commonly known as:  EPIPEN 2-PAK Inject 0.3 mLs (0.3 mg total) into the muscle once.   ezetimibe 10 MG tablet Commonly known as:  ZETIA Take 10 mg by mouth daily.   ibuprofen 200  MG tablet Commonly known as:  ADVIL,MOTRIN Take 200 mg by mouth every 6 (six) hours as needed for headache or moderate pain.   methylPREDNISolone 4 MG Tbpk tablet Commonly known as:  MEDROL Take according to package insert   oxyCODONE-acetaminophen 7.5-325 MG tablet Commonly known as:  PERCOCET Take 1 tablet by mouth every 4 (four) hours as needed.   PRESERVISION AREDS 2 PO Take 1 capsule by mouth 2 (two) times daily.   SYSTANE BALANCE 0.6 % Soln Generic drug:  Propylene Glycol Place 1-2 drops into both eyes daily as needed (for dry eyes).      Follow-up Information    Lisbeth Renshaw, MD. Schedule an  appointment as soon as possible for a visit in 3 week(s).   Specialty:  Neurosurgery Contact information: [address] [place] [address] [phone]           Signed: Alyson Ingles 04/26/2018, 3:20 PM

## 2018-04-26 NOTE — H&P (Signed)
Physical Medicine and Rehabilitation Admission H&P    : HPI: Cheryl Pearson a 77 year old right-handed female with past history of hypertension, anxiety. Per chart review patient lives with spouse. Independent with assistive device. One level home with 2 steps to entry.Presented 04/22/2018 with progressive right side leg weakness and gait instability times several weeks. Patient also notes some mild incontinence of bladder. X-rays and imaging revealed a thoracic myelopathy T1-2/spondylolisthesis. Underwent T1-T2 laminectomy for decompression of spinal cord as well as pedicle screws posterior lateral arthrodesis 04/22/2018 per Dr. Conchita Paris. Hospital course pain management. Decadron protocol as indicated. Therapy evaluations completed with recommendations of physical medicine rehabilitation consult. Patient was admitted for a compress of rehabilitation program.  Review of Systems  Constitutional: Positive for malaise/fatigue. Negative for chills and fever.  HENT: Negative for hearing loss.   Eyes: Negative for blurred vision and double vision.  Respiratory: Positive for shortness of breath.   Cardiovascular: Negative for chest pain, palpitations and leg swelling.  Gastrointestinal: Positive for constipation. Negative for nausea and vomiting.  Genitourinary: Negative for flank pain and hematuria.       Bladder stress incontinence  Musculoskeletal: Positive for back pain and joint pain.  Neurological: Positive for sensory change and focal weakness.  All other systems reviewed and are negative.  Past Medical History:  Diagnosis Date  . Anxiety   . Arthritis   . Colitis   . Early age-related macular degeneration   . Hernia   . Hypertension   . Infertility, female   . Menopausal symptoms   . Rosacea   . Thyroid disease 1968   hypothyroidism treated with meds for short time then all labs normal   Past Surgical History:  Procedure Laterality Date  . bone spur Left   . COLONOSCOPY   11/2011   polyp, ulcerative coliits inactive  . COLONOSCOPY  2005  . COLONOSCOPY  1970's   diagnosed with ulcerative colitis  . ESOPHAGOGASTRODUODENOSCOPY (EGD) WITH PROPOFOL N/A 11/20/2014   Procedure: ESOPHAGOGASTRODUODENOSCOPY (EGD) WITH PROPOFOL;  Surgeon: Carman Ching, MD;  Location: [facility];  Service: Endoscopy;  Laterality: N/A;  . INGUINAL HERNIA REPAIR Right 49 mos old  . LAPAROSCOPIC CHOLECYSTECTOMY  09/1998  . SAVORY DILATION N/A 11/20/2014   Procedure: SAVORY DILATION;  Surgeon: Carman Ching, MD;  Location: [facility];  Service: Endoscopy;  Laterality: N/A;  . WISDOM TOOTH EXTRACTION  1968   Family History  Problem Relation Age of Onset  . Cancer Mother        uterine cancer  . Osteoarthritis Mother   . Rheum arthritis Mother   . COPD Mother   . COPD Father   . COPD Brother   . Cancer Maternal Aunt        colon cancer   Social History:  reports that she has never smoked. She has never used smokeless tobacco. She reports current alcohol use of about 1.0 standard drinks of alcohol per week. She reports that she does not use drugs. Allergies:  Allergies  Allergen Reactions  . Yellow Jacket Venom [Bee Venom] Anaphylaxis  . Crestor [Rosuvastatin] Other (See Comments)    aches  . Lipitor [Atorvastatin] Other (See Comments)    Joint pain.  . Statins Other (See Comments)    aches  . Welchol [Colesevelam Hcl] Other (See Comments)    aches  . Zocor [Simvastatin] Other (See Comments)    aches   Medications Prior to Admission  Medication Sig Dispense Refill  . amLODipine (NORVASC) 2.5 MG tablet Take  2.5 mg by mouth daily.    Marland Kitchen ezetimibe (ZETIA) 10 MG tablet Take 10 mg by mouth daily.    Marland Kitchen ibuprofen (ADVIL,MOTRIN) 200 MG tablet Take 200 mg by mouth every 6 (six) hours as needed for headache or moderate pain.     . Multiple Vitamins-Minerals (PRESERVISION AREDS 2 PO) Take 1 capsule by mouth 2 (two) times daily.    Marland Kitchen Propylene Glycol (SYSTANE BALANCE) 0.6 % SOLN Place  1-2 drops into both eyes daily as needed (for dry eyes).    Marland Kitchen EPINEPHrine (EPIPEN 2-PAK) 0.3 mg/0.3 mL IJ SOAJ injection Inject 0.3 mLs (0.3 mg total) into the muscle once. (Patient not taking: Reported on 04/13/2018) 1 Device 1    Drug Regimen Review  Drug regimen was reviewed and remains appropriate with no significant issues identified  Home: Home Living Family/patient expects to be discharged to:: Private residence Living Arrangements: Spouse/significant other Available Help at Discharge: Family Type of Home: House Home Access: Stairs to enter Secretary/administrator of Steps: 2 Entrance Stairs-Rails: Can reach both Home Layout: One level Bathroom Shower/Tub: Engineer, manufacturing systems: Standard Home Equipment: Environmental consultant - standard, Environmental consultant - 4 wheels, Bedside commode   Functional History: Prior Function Level of Independence: Independent with assistive device(s) Comments: uses an standard RW, reports independent with ADLs  Functional Status:  Mobility: Bed Mobility General bed mobility comments: sitting in recliner upon entry Transfers Overall transfer level: Needs assistance Equipment used: Rolling walker (2 wheeled) Transfers: Sit to/from Stand Sit to Stand: Min assist Stand pivot transfers: Min assist General transfer comment: min assist to steady; cues for hand placement and safety Ambulation/Gait Ambulation/Gait assistance: Min assist, Mod assist, +2 safety/equipment Gait Distance (Feet): (Hallway ambulation) Assistive device: Rolling walker (2 wheeled) Gait Pattern/deviations: Step-through pattern, Decreased dorsiflexion - right, Drifts right/left General Gait Details: Improvements in gait distance and activity tolerance over yesterday; Still with decr dorsiflexion R, requires assist for RLE advancement approx 50% of steps; close guard of R knee for stabiltiy in stance; Less buckling than previous session, but still with 5-6 instances of R knee buckle, requiring  mod assist to prevent fall; 2 person assist for safety, and husband pushed chair behind Gait velocity: decreased Gait velocity interpretation: <1.8 ft/sec, indicate of risk for recurrent falls    ADL: ADL Overall ADL's : Needs assistance/impaired Grooming: Minimal assistance, Standing, Cueing for compensatory techniques Grooming Details (indicate cue type and reason): cueing for compensatory techniques, multiple cues to avoid foward lean with elbows on sink with poor compliance Upper Body Bathing: Set up, Sitting Lower Body Bathing: Sit to/from stand, Cueing for compensatory techniques, Cueing for back precautions, Min guard Upper Body Dressing : Set up, Sitting Lower Body Dressing: Sit to/from stand, Cueing for back precautions, Cueing for compensatory techniques, Min guard Lower Body Dressing Details (indicate cue type and reason): pt demonstrates ability to complete figure 4 technique while seated to reach B feet Toilet Transfer: Ambulation, RW, Comfort height toilet, Min guard, BSC Toilet Transfer Details (indicate cue type and reason): 3n1 over the commode Toileting- Clothing Manipulation and Hygiene: Cueing for compensatory techniques, Cueing for back precautions, Supervision/safety Tub/ Shower Transfer: Min guard, Walk-in shower, Cueing for safety, Cueing for sequencing, Ambulation, 3 in 1, Rolling walker Tub/Shower Transfer Details (indicate cue type and reason): simulated shower transfer with reverse step given min guard for safety, cueing for technique  Functional mobility during ADLs: Min guard, Rolling walker General ADL Comments: min guard for safety, cueing for precautions, pt. moving VERY slow  Cognition: Cognition Overall Cognitive Status: Impaired/Different from baseline Orientation Level: Oriented X4 Cognition Arousal/Alertness: Awake/alert Behavior During Therapy: WFL for tasks assessed/performed Overall Cognitive Status: Impaired/Different from baseline Area of  Impairment: Memory, Problem solving Memory: Decreased recall of precautions, Decreased short-term memory Problem Solving: Requires verbal cues, Requires tactile cues, Slow processing General Comments: Pt indicated she did not remember aspects of yesterday's PT session  Physical Exam: Blood pressure 123/65, pulse 85, temperature 98.2 F (36.8 C), temperature source Oral, resp. rate 16, height 5\' 5"  (1.651 m), weight 72.6 kg, SpO2 99 %. Physical Exam  Vitals reviewed. Constitutional: She appears well-developed. No distress.  HENT:  Head: Normocephalic.  Eyes: Pupils are equal, round, and reactive to light. Left eye exhibits no discharge.  Neck: Normal range of motion. No tracheal deviation present. No thyromegaly present.  Cardiovascular: Normal rate. Exam reveals no friction rub.  No murmur heard. Respiratory: Effort normal. No respiratory distress. She has no wheezes. She has no rales.  GI: Soft. She exhibits no distension. There is no abdominal tenderness.  Musculoskeletal: Normal range of motion.  Neurological:  Patient is alert. Follows commands. Provides her name and age  as well as date of birth.Mood is flat but appropriate. UE 5/5. RLE: HF/KE 2/5, APF 2/5, ADF tr/5. LLE: 3/5 HF, KE and 4/5 ADF/PF. Decrease LT/PP RLE and up to umbilicus. DTR's tr LE's    No results found for this or any previous visit (from the past 48 hour(s)). No results found.     Medical Problem List and Plan: 1.  Decreased functional mobility with right lower extremity weakness secondary to cervical and thoracic myelopathy. Status post decompression T1-2. Continue slow taper of Decadron  -admit to inpatient rehab  -may benefit from Right AFO 2.  DVT Prophylaxis/Anticoagulation: SCDs. Check vascular study 3. Pain Management: Robaxin and oxycodone as needed 4. Mood: Provide emotional support 5. Neuropsych: This patient is capable of making decisions on her own behalf. 6. Skin/Wound Care:  Routine skin  checks 7. Fluids/Electrolytes/Nutrition:  Routine in and out's with follow-up chemistries 8. Hypertension. Norvasc 2.5 mg daily. Monitor with increased mobility 9. Hyperlipidemia. Zetia 10. Constipation. Laxative assistance, encourage appropriate PO intake      Post Admission Physician Evaluation: 1. Functional deficits secondary  to cervical and thoracic spinal stenosis with myelopathy. 2. Patient is admitted to receive collaborative, interdisciplinary care between the physiatrist, rehab nursing staff, and therapy team. 3. Patient's level of medical complexity and substantial therapy needs in context of that medical necessity cannot be provided at a lesser intensity of care such as a SNF. 4. Patient has experienced substantial functional loss from his/her baseline which was documented above under the "Functional History" and "Functional Status" headings.  Judging by the patient's diagnosis, physical exam, and functional history, the patient has potential for functional progress which will result in measurable gains while on inpatient rehab.  These gains will be of substantial and practical use upon discharge  in facilitating mobility and self-care at the household level. 5. Physiatrist will provide 24 hour management of medical needs as well as oversight of the therapy plan/treatment and provide guidance as appropriate regarding the interaction of the two. 6. The Preadmission Screening has been reviewed and patient status is unchanged unless otherwise stated above. 7. 24 hour rehab nursing will assist with bladder management, bowel management, safety, skin/wound care, disease management, medication administration, pain management and patient education  and help integrate therapy concepts, techniques,education, etc. 8. PT will assess and treat for/with: Lower extremity  strength, range of motion, stamina, balance, functional mobility, safety, adaptive techniques and equipment, pain mgt, orthotics,  NMR.   Goals are: supervision . 9. OT will assess and treat for/with: ADL's, functional mobility, safety, upper extremity strength, adaptive techniques and equipment, NMR, community reentry.   Goals are: supervision. Therapy may proceed with showering this patient. 10. SLP will assess and treat for/with: n/a.  Goals are: n/a. 11. Case Management and Social Worker will assess and treat for psychological issues and discharge planning. 12. Team conference will be held weekly to assess progress toward goals and to determine barriers to discharge. 13. Patient will receive at least 3 hours of therapy per day at least 5 days per week. 14. ELOS: 12-15 days       15. Prognosis:  excellent   I have personally performed a face to face diagnostic evaluation of this patient and formulated the key components of the plan.  Additionally, I have personally reviewed laboratory data, imaging studies, as well as relevant notes and concur with the physician assistant's documentation above.  Ranelle OysterZachary T. Toma Erichsen, MD, FAAPMR    Mcarthur Rossettianiel J Angiulli, PA-C 04/26/2018

## 2018-04-26 NOTE — Progress Notes (Signed)
IP rehab admissions - [facility] and have [facility] clinicals requesting acute inpatient rehab admission for today.  I will await call back from insurance case manager.  Call me for questions.  [phone]

## 2018-04-26 NOTE — Progress Notes (Signed)
  NEUROSURGERY PROGRESS NOTE   Agitated overnight and ?confused per husband. Back to baseline now. Note improvement in overall function while on steroids. Worked better with therapy yesterday when compared to Sunday  EXAM:  BP 123/65 (BP Location: Right Arm)   Pulse 85   Temp 98.2 F (36.8 C) (Oral)   Resp 16   Ht 5\' 5"  (1.651 m)   Wt 72.6 kg   SpO2 99%   BMI 26.63 kg/m   Awake, alert, oriented  Speech fluent, appropriate  CN grossly intact  5/5 BUE and LLE RLE 3-4-/5 with exception of PF/DF 2/5, 1/5  PLAN Stable neurologically Will continue steroids for several days Agitation/intermittent confusion: likely hospital delirium/medication induced Still believe she would be a good candidate for CIR. Will touch base with rehab again.

## 2018-04-26 NOTE — Progress Notes (Signed)
Report called and given to Morrie SheldonAshley, RN in CIR. PIV remains in place per Lemont FurnaceAshley. All questions answered to satisfaction. Pt to be escorted off the unit via wheelchair by staff member to [phone].

## 2018-04-26 NOTE — H&P (Signed)
Physical Medicine and Rehabilitation Admission H&P     : HPI: Cheryl Pearson a 77 year old right-handed female with past history of hypertension, anxiety. Per chart review patient lives with spouse. Independent with assistive device. One level home with 2 steps to entry.Presented 04/22/2018 with progressive right side leg weakness and gait instability times several weeks. Patient also notes some mild incontinence of bladder. X-rays and imaging revealed a thoracic myelopathy T1-2/spondylolisthesis. Underwent T1-T2 laminectomy for decompression of spinal cord as well as pedicle screws posterior lateral arthrodesis 04/22/2018 per Dr. Conchita ParisNundkumar. Hospital course pain management. Decadron protocol as indicated. Therapy evaluations completed with recommendations of physical medicine rehabilitation consult. Patient was admitted for a compress of rehabilitation program.   Review of Systems  Constitutional: Positive for malaise/fatigue. Negative for chills and fever.  HENT: Negative for hearing loss.   Eyes: Negative for blurred vision and double vision.  Respiratory: Positive for shortness of breath.   Cardiovascular: Negative for chest pain, palpitations and leg swelling.  Gastrointestinal: Positive for constipation. Negative for nausea and vomiting.  Genitourinary: Negative for flank pain and hematuria.       Bladder stress incontinence  Musculoskeletal: Positive for back pain and joint pain.  Neurological: Positive for sensory change and focal weakness.  All other systems reviewed and are negative.       Past Medical History:  Diagnosis Date  . Anxiety    . Arthritis    . Colitis    . Early age-related macular degeneration    . Hernia    . Hypertension    . Infertility, female    . Menopausal symptoms    . Rosacea    . Thyroid disease 1968    hypothyroidism treated with meds for short time then all labs normal         Past Surgical History:  Procedure Laterality Date  . bone  spur Left    . COLONOSCOPY   11/2011    polyp, ulcerative coliits inactive  . COLONOSCOPY   2005  . COLONOSCOPY   1970's    diagnosed with ulcerative colitis  . ESOPHAGOGASTRODUODENOSCOPY (EGD) WITH PROPOFOL N/A 11/20/2014    Procedure: ESOPHAGOGASTRODUODENOSCOPY (EGD) WITH PROPOFOL;  Surgeon: Carman ChingJames Edwards, MD;  Location: [facility];  Service: Endoscopy;  Laterality: N/A;  . INGUINAL HERNIA REPAIR Right 6218 mos old  . LAPAROSCOPIC CHOLECYSTECTOMY   09/1998  . SAVORY DILATION N/A 11/20/2014    Procedure: SAVORY DILATION;  Surgeon: Carman ChingJames Edwards, MD;  Location: [facility];  Service: Endoscopy;  Laterality: N/A;  . WISDOM TOOTH EXTRACTION   1968         Family History  Problem Relation Age of Onset  . Cancer Mother          uterine cancer  . Osteoarthritis Mother    . Rheum arthritis Mother    . COPD Mother    . COPD Father    . COPD Brother    . Cancer Maternal Aunt          colon cancer    Social History:  reports that she has never smoked. She has never used smokeless tobacco. She reports current alcohol use of about 1.0 standard drinks of alcohol per week. She reports that she does not use drugs. Allergies:  Allergies  Allergen Reactions  . Yellow Jacket Venom [Bee Venom] Anaphylaxis  . Crestor [Rosuvastatin] Other (See Comments)      aches  . Lipitor [Atorvastatin] Other (See Comments)  Joint pain.  . Statins Other (See Comments)      aches  . Welchol [Colesevelam Hcl] Other (See Comments)      aches  . Zocor [Simvastatin] Other (See Comments)      aches          Medications Prior to Admission  Medication Sig Dispense Refill  . amLODipine (NORVASC) 2.5 MG tablet Take 2.5 mg by mouth daily.      Marland Kitchen ezetimibe (ZETIA) 10 MG tablet Take 10 mg by mouth daily.      Marland Kitchen ibuprofen (ADVIL,MOTRIN) 200 MG tablet Take 200 mg by mouth every 6 (six) hours as needed for headache or moderate pain.       . Multiple Vitamins-Minerals (PRESERVISION AREDS 2 PO) Take 1 capsule by mouth  2 (two) times daily.      Marland Kitchen Propylene Glycol (SYSTANE BALANCE) 0.6 % SOLN Place 1-2 drops into both eyes daily as needed (for dry eyes).      Marland Kitchen EPINEPHrine (EPIPEN 2-PAK) 0.3 mg/0.3 mL IJ SOAJ injection Inject 0.3 mLs (0.3 mg total) into the muscle once. (Patient not taking: Reported on 04/13/2018) 1 Device 1      Drug Regimen Review  Drug regimen was reviewed and remains appropriate with no significant issues identified   Home: Home Living Family/patient expects to be discharged to:: Private residence Living Arrangements: Spouse/significant other Available Help at Discharge: Family Type of Home: House Home Access: Stairs to enter Secretary/administrator of Steps: 2 Entrance Stairs-Rails: Can reach both Home Layout: One level Bathroom Shower/Tub: Engineer, manufacturing systems: Standard Home Equipment: Environmental consultant - standard, Environmental consultant - 4 wheels, Bedside commode   Functional History: Prior Function Level of Independence: Independent with assistive device(s) Comments: uses an standard RW, reports independent with ADLs   Functional Status:  Mobility: Bed Mobility General bed mobility comments: sitting in recliner upon entry Transfers Overall transfer level: Needs assistance Equipment used: Rolling walker (2 wheeled) Transfers: Sit to/from Stand Sit to Stand: Min assist Stand pivot transfers: Min assist General transfer comment: min assist to steady; cues for hand placement and safety Ambulation/Gait Ambulation/Gait assistance: Min assist, Mod assist, +2 safety/equipment Gait Distance (Feet): (Hallway ambulation) Assistive device: Rolling walker (2 wheeled) Gait Pattern/deviations: Step-through pattern, Decreased dorsiflexion - right, Drifts right/left General Gait Details: Improvements in gait distance and activity tolerance over yesterday; Still with decr dorsiflexion R, requires assist for RLE advancement approx 50% of steps; close guard of R knee for stabiltiy in stance; Less  buckling than previous session, but still with 5-6 instances of R knee buckle, requiring mod assist to prevent fall; 2 person assist for safety, and husband pushed chair behind Gait velocity: decreased Gait velocity interpretation: <1.8 ft/sec, indicate of risk for recurrent falls   ADL: ADL Overall ADL's : Needs assistance/impaired Grooming: Minimal assistance, Standing, Cueing for compensatory techniques Grooming Details (indicate cue type and reason): cueing for compensatory techniques, multiple cues to avoid foward lean with elbows on sink with poor compliance Upper Body Bathing: Set up, Sitting Lower Body Bathing: Sit to/from stand, Cueing for compensatory techniques, Cueing for back precautions, Min guard Upper Body Dressing : Set up, Sitting Lower Body Dressing: Sit to/from stand, Cueing for back precautions, Cueing for compensatory techniques, Min guard Lower Body Dressing Details (indicate cue type and reason): pt demonstrates ability to complete figure 4 technique while seated to reach B feet Toilet Transfer: Ambulation, RW, Comfort height toilet, Min guard, BSC Toilet Transfer Details (indicate cue type and reason): 3n1 over the  commode Toileting- Clothing Manipulation and Hygiene: Cueing for compensatory techniques, Cueing for back precautions, Supervision/safety Tub/ Shower Transfer: Min guard, Walk-in shower, Cueing for safety, Cueing for sequencing, Ambulation, 3 in 1, Rolling walker Tub/Shower Transfer Details (indicate cue type and reason): simulated shower transfer with reverse step given min guard for safety, cueing for technique  Functional mobility during ADLs: Min guard, Rolling walker General ADL Comments: min guard for safety, cueing for precautions, pt. moving VERY slow   Cognition: Cognition Overall Cognitive Status: Impaired/Different from baseline Orientation Level: Oriented X4 Cognition Arousal/Alertness: Awake/alert Behavior During Therapy: WFL for tasks  assessed/performed Overall Cognitive Status: Impaired/Different from baseline Area of Impairment: Memory, Problem solving Memory: Decreased recall of precautions, Decreased short-term memory Problem Solving: Requires verbal cues, Requires tactile cues, Slow processing General Comments: Pt indicated she did not remember aspects of yesterday's PT session   Physical Exam: Blood pressure 123/65, pulse 85, temperature 98.2 F (36.8 C), temperature source Oral, resp. rate 16, height  (1.651 m), weight 72.6 kg, SpO2 99 %. Physical Exam  Vitals reviewed. Constitutional: She appears well-developed. No distress.  HENT:  Head: Normocephalic.  Eyes: Pupils are equal, round, and reactive to light. Left eye exhibits no discharge.  Neck: Normal range of motion. No tracheal deviation present. No thyromegaly present.  Cardiovascular: Normal rate. Exam reveals no friction rub.  No murmur heard. Respiratory: Effort normal. No respiratory distress. She has no wheezes. She has no rales.  GI: Soft. She exhibits no distension. There is no abdominal tenderness.  Musculoskeletal: Normal range of motion.  Neurological:  Patient is alert. Follows commands. Provides her name and age  as well as date of birth.Mood is flat but appropriate. UE 5/5. RLE: HF/KE 2/5, APF 2/5, ADF tr/5. LLE: 3/5 HF, KE and 4/5 ADF/PF. Decrease LT/PP RLE and up to umbilicus. DTR's tr LE's      Lab Results Last 48 Hours  No results found for this or any previous visit (from the past 48 hour(s)).   Imaging Results (Last 48 hours)  No results found.           Medical Problem List and Plan: 1.  Decreased functional mobility with right lower extremity weakness secondary to cervical and thoracic myelopathy. Status post decompression T1-2. Continue slow taper of Decadron             -admit to inpatient rehab             -may benefit from Right AFO 2.  DVT Prophylaxis/Anticoagulation: SCDs. Check vascular study 3. Pain Management:  Robaxin and oxycodone as needed 4. Mood: Provide emotional support 5. Neuropsych: This patient is capable of making decisions on her own behalf. 6. Skin/Wound Care:  Routine skin checks 7. Fluids/Electrolytes/Nutrition:  Routine in and out's with follow-up chemistries 8. Hypertension. Norvasc 2.5 mg daily. Monitor with increased mobility 9. Hyperlipidemia. Zetia 10. Constipation. Laxative assistance, encourage appropriate PO intake           Post Admission Physician Evaluation: 1. Functional deficits secondary  to cervical and thoracic spinal stenosis with myelopathy. 2. Patient is admitted to receive collaborative, interdisciplinary care between the physiatrist, rehab nursing staff, and therapy team. 3. Patient's level of medical complexity and substantial therapy needs in context of that medical necessity cannot be provided at a lesser intensity of care such as a SNF. 4. Patient has experienced substantial functional loss from his/her baseline which was documented above under the "Functional History" and "Functional Status" headings.  Judging by the patient's  diagnosis, physical exam, and functional history, the patient has potential for functional progress which will result in measurable gains while on inpatient rehab.  These gains will be of substantial and practical use upon discharge  in facilitating mobility and self-care at the household level. 5. Physiatrist will provide 24 hour management of medical needs as well as oversight of the therapy plan/treatment and provide guidance as appropriate regarding the interaction of the two. 6. The Preadmission Screening has been reviewed and patient status is unchanged unless otherwise stated above. 7. 24 hour rehab nursing will assist with bladder management, bowel management, safety, skin/wound care, disease management, medication administration, pain management and patient education  and help integrate therapy concepts, techniques,education,  etc. 8. PT will assess and treat for/with: Lower extremity strength, range of motion, stamina, balance, functional mobility, safety, adaptive techniques and equipment, pain mgt, orthotics, NMR.   Goals are: supervision . 9. OT will assess and treat for/with: ADL's, functional mobility, safety, upper extremity strength, adaptive techniques and equipment, NMR, community reentry.   Goals are: supervision. Therapy may proceed with showering this patient. 10. SLP will assess and treat for/with: n/a.  Goals are: n/a. 11. Case Management and Social Worker will assess and treat for psychological issues and discharge planning. 12. Team conference will be held weekly to assess progress toward goals and to determine barriers to discharge. 13. Patient will receive at least 3 hours of therapy per day at least 5 days per week. 14. ELOS: 12-15 days       15. Prognosis:  excellent     I have personally performed a face to face diagnostic evaluation of this patient and formulated the key components of the plan.  Additionally, I have personally reviewed laboratory data, imaging studies, as well as relevant notes and concur with the physician assistant's documentation above.   Ranelle Oyster, MD, FAAPMR     Mcarthur Rossetti Angiulli, PA-C 04/26/2018  The patient's status has not changed. The original post admission physician evaluation remains appropriate, and any changes from the pre-admission screening or documentation from the acute chart are noted above.  Ranelle Oyster, MD 04/26/2018

## 2018-04-26 NOTE — Progress Notes (Signed)
IP rehab admissions - I have approval for acute inpatient rehab admission for today.  I have texted Cindra PresumeVincent Costella PA for orders and DC summary.  I hope to admit to CIR today.  Call me for questions.  [phone]

## 2018-04-26 NOTE — Progress Notes (Signed)
Patient admitted about 1630. Patient arrived to room with husband. Patient given rehab notebook and this nurse went over safety plan and rehab schedule. All questions answered. All belongings left within reach.

## 2018-04-26 NOTE — PMR Pre-admission (Signed)
PMR Admission Coordinator Pre-Admission Assessment  Patient: Cheryl Pearson is an 77 y.o., female MRN: [id number] DOB: 13-Jul-1940 Height: _0  (165.1 cm) Weight: 72.6 kg              Insurance Information HMO:     PPO: Yes     PCP:       IPA:       80/20:       OTHER:  Group # [id number] PRIMARY: [employer] Medicare      Policy#: [id number]      Subscriber: patient CM Name: Abigail Butts      Phone#: [phone]     Fax#: [phone] Pre-Cert#: [id number] for 7 days with follow up to Dorthula Nettles at fax [phone]      Employer: [employer] Benefits:  Phone #: [phone]     Name:  Telephone call - Lilly Eff. Date: 04/27/17     Deduct:  $0      Out of Pocket Max: $4000 (met $724.15)      Life Max: N/A CIR: $160 days 1-10      SNF:  $0 days 1-20; $50 days 21-100 Outpatient: medical necessity     Co-Pay: $20/visit Home Health: 100%      Co-Pay: none DME: 80%     Co-Pay: 20% Providers: in network  Medicaid Application Date:        Case Manager:   Disability Application Date:        Case Worker:    Emergency Facilities manager Information    Name Relation Home Work Mobile   Shutter,Richard Spouse [phone]  [phone]     Current Medical History  Patient Admitting Diagnosis:Cervical and thoracic myelopathy status post thoracic decompression    History of Present Illness: A 77 year old right-handed female with past history of hypertension, anxiety. Per chart review patient lives with spouse. Independent with assistive device. One level home with 2 steps to entry.Presented 04/22/2018 with progressive right side leg weakness and gait instability times several weeks. Patient also notes some mild incontinence of bladder. X-rays and imaging revealed a thoracic myelopathy T1-2/spondylolisthesis. Underwent T1-T2 laminectomy for decompression of spinal cord as well as pedicle screws posterior lateral arthrodesis 04/22/2018 per Dr. Kathyrn Sheriff. Hospital course pain management. Decadron protocol as indicated.  Therapy evaluations completed with recommendations of physical medicine rehabilitation consult. Patient to be admitted for a comprehensive inpatient rehabilitation program.   Past Medical History  Past Medical History:  Diagnosis Date  . Anxiety   . Arthritis   . Colitis   . Early age-related macular degeneration   . Hernia   . Hypertension   . Infertility, female   . Menopausal symptoms   . Rosacea   . Thyroid disease 1968   hypothyroidism treated with meds for short time then all labs normal    Family History  family history includes COPD in her brother, father, and mother; Cancer in her maternal aunt and mother; Osteoarthritis in her mother; Rheum arthritis in her mother.  Prior Rehab/Hospitalizations: [facility] to outpatient therapy for balance in Nov 2019.  Has the patient had major surgery during 100 days prior to admission? No  Current Medications   Current Facility-Administered Medications:  .  0.9 %  sodium chloride infusion, , Intravenous, Continuous, Nundkumar, Neelesh, MD .  0.9 %  sodium chloride infusion, 250 mL, Intravenous, Continuous, Consuella Lose, MD .  acetaminophen (TYLENOL) tablet 650 mg, 650 mg, Oral, Q4H PRN, 650 mg at 04/26/18 0620 **OR** acetaminophen (TYLENOL) suppository 650 mg, 650 mg, Rectal,  Q4H PRN, Consuella Lose, MD .  amLODipine (NORVASC) tablet 2.5 mg, 2.5 mg, Oral, Daily, Consuella Lose, MD .  bisacodyl (DULCOLAX) suppository 10 mg, 10 mg, Rectal, Daily PRN, Consuella Lose, MD .  dexamethasone (DECADRON) injection 4 mg, 4 mg, Intravenous, Q12H, Costella, Vincent J, PA-C, 4 mg at 04/26/18 1058 .  docusate sodium (COLACE) capsule 100 mg, 100 mg, Oral, BID, Consuella Lose, MD, 100 mg at 04/25/18 0844 .  ezetimibe (ZETIA) tablet 10 mg, 10 mg, Oral, Daily, Nundkumar, Nena Polio, MD .  menthol-cetylpyridinium (CEPACOL) lozenge 3 mg, 1 lozenge, Oral, PRN **OR** phenol (CHLORASEPTIC) mouth spray 1 spray, 1 spray, Mouth/Throat, PRN,  Consuella Lose, MD .  methocarbamol (ROBAXIN) tablet 750 mg, 750 mg, Oral, Q6H PRN, 750 mg at 04/26/18 0215 **OR** methocarbamol (ROBAXIN) 750 mg in dextrose 5 % 50 mL IVPB, 750 mg, Intravenous, Q6H PRN, Costella, Vincent J, PA-C .  multivitamin (PROSIGHT) tablet 1 tablet, 1 tablet, Oral, Daily, Nundkumar, Nena Polio, MD .  ondansetron (ZOFRAN) tablet 4 mg, 4 mg, Oral, Q6H PRN **OR** ondansetron (ZOFRAN) injection 4 mg, 4 mg, Intravenous, Q6H PRN, Consuella Lose, MD .  oxyCODONE (Oxy IR/ROXICODONE) immediate release tablet 10 mg, 10 mg, Oral, Q3H PRN, Consuella Lose, MD, 10 mg at 04/24/18 0343 .  oxyCODONE (Oxy IR/ROXICODONE) immediate release tablet 5 mg, 5 mg, Oral, Q3H PRN, Consuella Lose, MD, 5 mg at 04/26/18 0216 .  pantoprazole (PROTONIX) EC tablet 40 mg, 40 mg, Oral, QHS, Consuella Lose, MD, 40 mg at 04/25/18 2122 .  polyethylene glycol (MIRALAX / GLYCOLAX) packet 17 g, 17 g, Oral, Daily PRN, Consuella Lose, MD .  polyvinyl alcohol (LIQUIFILM TEARS) 1.4 % ophthalmic solution 1 drop, 1 drop, Both Eyes, Daily PRN, Consuella Lose, MD .  senna (SENOKOT) tablet 8.6 mg, 1 tablet, Oral, BID, Consuella Lose, MD, 8.6 mg at 04/25/18 0843 .  sodium chloride flush (NS) 0.9 % injection 3 mL, 3 mL, Intravenous, Q12H, Consuella Lose, MD, 3 mL at 04/25/18 0844 .  sodium chloride flush (NS) 0.9 % injection 3 mL, 3 mL, Intravenous, PRN, Consuella Lose, MD .  sodium phosphate (FLEET) 7-19 GM/118ML enema 1 enema, 1 enema, Rectal, Once PRN, Consuella Lose, MD  Patients Current Diet:  Diet Order            Diet regular Room service appropriate? Yes; Fluid consistency: Thin  Diet effective now              Precautions / Restrictions Precautions Precautions: Back Precaution Booklet Issued: Yes (comment) Precaution Comments: Will need continuing reinforcement of back precautions; noting not a lot of carryover from yesterday's session Restrictions Weight Bearing  Restrictions: No   Has the patient had 2 or more falls or a fall with injury in the past year?No.  Patient reports 1 fall last March 2019.  Prior Activity Level Community (5-7x/wk): [facility] out daily, was driving up until Nov. 2019  [place] Devices/Equipment: Kasandra Knudsen (specify quad or straight), Gilford Rile (specify type), Eyeglasses Home Equipment: Gilford Rile - standard, Walker - 4 wheels, Bedside commode  Prior Device Use: Indicate devices/aids used by the patient prior to current illness, exacerbation or injury? Walker and [employer]  Prior Functional Level Prior Function Level of Independence: Independent with assistive device(s) Comments: uses an standard RW, reports independent with ADLs  Self Care: Did the patient need help bathing, dressing, using the toilet or eating?  Independent  Indoor Mobility: Did the patient need assistance with walking from room to room (with or  without device)? Independent  Stairs: Did the patient need assistance with internal or external stairs (with or without device)? Independent  Functional Cognition: Did the patient need help planning regular tasks such as shopping or remembering to take medications? Independent  Current Functional Level Cognition  Overall Cognitive Status: Impaired/Different from baseline Orientation Level: Oriented X4 General Comments: Pt indicated she did not remember aspects of yesterday's PT session    Extremity Assessment (includes Sensation/Coordination)  Upper Extremity Assessment: Overall WFL for tasks assessed  Lower Extremity Assessment: Defer to PT evaluation RLE Deficits / Details: noted RLE weakness reported per patient, appears functional upon testing grossly 4/5 all motions including dorsiflexion. no evidence of foot drop as previously indicated by patient RLE Sensation: history of peripheral neuropathy    ADLs  Overall ADL's : Needs assistance/impaired Grooming: Minimal assistance,  Standing, Cueing for compensatory techniques Grooming Details (indicate cue type and reason): cueing for compensatory techniques, multiple cues to avoid foward lean with elbows on sink with poor compliance Upper Body Bathing: Set up, Sitting Lower Body Bathing: Sit to/from stand, Cueing for compensatory techniques, Cueing for back precautions, Min guard Upper Body Dressing : Set up, Sitting Lower Body Dressing: Sit to/from stand, Cueing for back precautions, Cueing for compensatory techniques, Min guard Lower Body Dressing Details (indicate cue type and reason): pt demonstrates ability to complete figure 4 technique while seated to reach B feet Toilet Transfer: Ambulation, RW, Comfort height toilet, Min guard, BSC Toilet Transfer Details (indicate cue type and reason): 3n1 over the commode Toileting- Clothing Manipulation and Hygiene: Cueing for compensatory techniques, Cueing for back precautions, Supervision/safety Tub/ Shower Transfer: Min guard, Walk-in shower, Cueing for safety, Cueing for sequencing, Ambulation, 3 in 1, Rolling walker Tub/Shower Transfer Details (indicate cue type and reason): simulated shower transfer with reverse step given min guard for safety, cueing for technique  Functional mobility during ADLs: Min guard, Rolling walker General ADL Comments: min guard for safety, cueing for precautions, pt. moving VERY slow    Mobility  General bed mobility comments: sitting in recliner upon entry    Transfers  Overall transfer level: Needs assistance Equipment used: Rolling walker (2 wheeled) Transfers: Sit to/from Stand Sit to Stand: Min assist Stand pivot transfers: Min assist General transfer comment: min assist to steady; cues for hand placement and safety    Ambulation / Gait / Stairs / Wheelchair Mobility  Ambulation/Gait Ambulation/Gait assistance: Min assist, Mod assist, +2 safety/equipment Gait Distance (Feet): (Hallway ambulation) Assistive device: Rolling walker  (2 wheeled) Gait Pattern/deviations: Step-through pattern, Decreased dorsiflexion - right, Drifts right/left General Gait Details: Improvements in gait distance and activity tolerance over yesterday; Still with decr dorsiflexion R, requires assist for RLE advancement approx 50% of steps; close guard of R knee for stabiltiy in stance; Less buckling than previous session, but still with 5-6 instances of R knee buckle, requiring mod assist to prevent fall; 2 person assist for safety, and husband pushed chair behind Gait velocity: decreased Gait velocity interpretation: <1.8 ft/sec, indicate of risk for recurrent falls    Posture / Balance Balance Overall balance assessment: Needs assistance Sitting-balance support: No upper extremity supported, Feet supported Sitting balance-Leahy Scale: Good Standing balance support: Bilateral upper extremity supported, During functional activity Standing balance-Leahy Scale: Poor Standing balance comment: reliant on UE support    Special needs/care consideration BiPAP/CPAP No CPM No Continuous Drip IV No Dialysis No        Life Vest No Oxygen No Special Bed No Trach Size no Wound Vac (  area) No      Skin thoracic back post op incision                            Bowel mgmt: Last BM 04/25/18 Bladder mgmt: BRP with assistance Diabetic mgmt No    Previous Home Environment Living Arrangements: Spouse/significant other Available Help at Discharge: Family Type of Home: House Home Layout: One level Home Access: Stairs to enter Entrance Stairs-Rails: Can reach both Entrance Stairs-Number of Steps: 2 Bathroom Shower/Tub: Chiropodist: [place]: No  Discharge Living Setting Plans for Discharge Living Setting: Patient's home, House, Lives with (comment)(Lives with husband.) Type of Home at Discharge: House Discharge Home Layout: One level Discharge Home Access: Stairs to enter Entrance Stairs-Number of Steps: 3  steps Discharge Bathroom Shower/Tub: Walk-in shower, Curtain Discharge Bathroom Toilet: Standard Discharge Bathroom Accessibility: No(Narrow bathroom) Does the patient have any problems obtaining your medications?: No  Social/Family/Support Systems Patient Roles: Spouse, Parent(Has a husband and a daughter.) Contact Information: Shawnee Knapp - husband Anticipated Caregiver: Richard Anticipated Caregiver's Contact Information: Emerson Barretto - spouse - [phone] Ability/Limitations of Caregiver: Husband is retired and can assist.  He has been staying with patient in the hospital.  Daughter lives in [place].  Caregiver Availability: 24/7 Discharge Plan Discussed with Primary Caregiver: Yes Is Caregiver In Agreement with Plan?: Yes Does Caregiver/Family have Issues with Lodging/Transportation while Pt is in Rehab?: No  Goals/Additional Needs Patient/Family Goal for Rehab: PT/OT supervision goals Expected length of stay: 12-15 days Cultural Considerations: None Dietary Needs: Regular diet, thin liquids Equipment Needs: TBD Pt/Family Agrees to Admission and willing to participate: Yes Program Orientation Provided & Reviewed with Pt/Caregiver Including Roles  & Responsibilities: Yes  Decrease burden of Care through IP rehab admission: N/A  Possible need for SNF placement upon discharge: Not anticipated  Patient Condition: This patient's medical and functional status has changed since the consult dated: 04/23/18 in which the Rehabilitation Physician determined and documented that the patient's condition is appropriate for intensive rehabilitative care in an inpatient rehabilitation facility. See "History of Present Illness" (above) for medical update. Functional changes are: Currently requiring min to mod assist for hallway ambulation.  PT continuing to recommend inpatient rehab stay.  Neurosurgery documenting no cervical intervention at this time but will follow for increased symptoms.   Patient's medical and functional status update has been discussed with the Rehabilitation physician and patient remains appropriate for inpatient rehabilitation. Will admit to inpatient rehab today.  Preadmission Screen Completed By:  Retta Diones, 04/26/2018 2:05 PM ______________________________________________________________________   Discussed status with Dr. Naaman Plummer on 04/26/18 at 1405 and received telephone approval for admission today.  Admission Coordinator:  Retta Diones, time 1405/Date 04/26/18

## 2018-04-26 NOTE — Progress Notes (Signed)
Physical Therapy Treatment Patient Details Name: Cheryl Pearson MRN: [id number] DOB: 01/05/1941 Today's Date: 04/26/2018    History of Present Illness 77 y.o. female s/p thoracic fusion due to thoracic myelopathy. PMH: anxiety, HTN, arthritis.     PT Comments    Continuing work on functional mobility and activity tolerance;  Good success with trial of ace wrap for dorsiflexion assist; Discussed pt with Dr. Hermelinda MedicusSchwartz, PM&R doctor; Noted plans for dc to CIR today   Follow Up Recommendations  CIR     Equipment Recommendations  Rolling walker with 5" wheels;3in1 (PT);Other (comment)(Will need to consider some form of AFO)    Recommendations for Other Services       Precautions / Restrictions Precautions Precautions: Back Precaution Booklet Issued: Yes (comment) Precaution Comments: Will need continuing reinforcement of back precautions; noting not a lot of carryover from yesterday's session    Mobility  Bed Mobility               General bed mobility comments: sitting in recliner upon entry  Transfers Overall transfer level: Needs assistance Equipment used: Rolling walker (2 wheeled) Transfers: Sit to/from Stand Sit to Stand: Min assist;Min guard         General transfer comment: min assist to steady; cues for hand placement and safety  Ambulation/Gait Ambulation/Gait assistance: Min assist;+2 safety/equipment Gait Distance (Feet): (Hallway ambulation) Assistive device: Rolling walker (2 wheeled)(and Ace wrap for dorsiflexion assist) Gait Pattern/deviations: Step-through pattern;Decreased dorsiflexion - right;Drifts right/left     General Gait Details: Improvements in gait distance and activity tolerance over yesterday; Still with decr dorsiflexion R, Ace wrapped for dorsiflexion assist; close guard of R knee for stabiltiy in stance; Less buckling than previous session, but still with 5-6 instances of R knee buckle2 person assist for safety, and husband pushed chair  behind   Stairs             Wheelchair Mobility    Modified Rankin (Stroke Patients Only)       Balance     Sitting balance-Leahy Scale: Good       Standing balance-Leahy Scale: Poor                              Cognition Arousal/Alertness: Awake/alert Behavior During Therapy: WFL for tasks assessed/performed Overall Cognitive Status: Impaired/Different from baseline Area of Impairment: Memory;Problem solving                     Memory: Decreased recall of precautions;Decreased short-term memory       Problem Solving: Requires verbal cues;Requires tactile cues;Slow processing        Exercises      General Comments        Pertinent Vitals/Pain Pain Assessment: Faces Faces Pain Scale: Hurts a little bit Pain Location: back and shoulders Pain Descriptors / Indicators: Discomfort;Guarding Pain Intervention(s): Monitored during session    Home Living                      Prior Function            PT Goals (current goals can now be found in the care plan section) Acute Rehab PT Goals Patient Stated Goal: to get better PT Goal Formulation: With patient/family Time For Goal Achievement: 05/07/18 Potential to Achieve Goals: Good Progress towards PT goals: Progressing toward goals    Frequency    Min 5X/week  PT Plan Current plan remains appropriate    Co-evaluation              AM-PAC PT "6 Clicks" Mobility   Outcome Measure  Help needed turning from your back to your side while in a flat bed without using bedrails?: A Little Help needed moving from lying on your back to sitting on the side of a flat bed without using bedrails?: A Little Help needed moving to and from a bed to a chair (including a wheelchair)?: A Little Help needed standing up from a chair using your arms (e.g., wheelchair or bedside chair)?: A Little Help needed to walk in hospital room?: A Little Help needed climbing 3-5 steps  with a railing? : Total 6 Click Score: 16    End of Session Equipment Utilized During Treatment: Gait belt(ace wrap for dorsiflexion assist) Activity Tolerance: Patient tolerated treatment well Patient left: in chair;with call bell/phone within reach;with family/visitor present Nurse Communication: Mobility status PT Visit Diagnosis: Unsteadiness on feet (R26.81);Difficulty in walking, not elsewhere classified (R26.2);Other symptoms and signs involving the nervous system (R29.898)     Time: [id number] PT Time Calculation (min) (ACUTE ONLY): 32 min  Charges:  $Gait Training: 23-37 mins                     Van ClinesHolly Evaristo Tsuda, [place]PT  Acute Rehabilitation Services Pager [phone] Office [phone]    Cheryl AlandHolly H Audreyanna Pearson 04/26/2018, 4:11 PM

## 2018-04-27 ENCOUNTER — Inpatient Hospital Stay (HOSPITAL_COMMUNITY): Payer: Medicare Other | Admitting: Physical Therapy

## 2018-04-27 ENCOUNTER — Inpatient Hospital Stay (HOSPITAL_COMMUNITY): Payer: Medicare Other

## 2018-04-27 DIAGNOSIS — M7989 Other specified soft tissue disorders: Secondary | ICD-10-CM

## 2018-04-27 DIAGNOSIS — I10 Essential (primary) hypertension: Secondary | ICD-10-CM

## 2018-04-27 LAB — COMPREHENSIVE METABOLIC PANEL
ALT: 139 U/L — ABNORMAL HIGH (ref 0–44)
AST: 121 U/L — ABNORMAL HIGH (ref 15–41)
Albumin: 2.8 g/dL — ABNORMAL LOW (ref 3.5–5.0)
Alkaline Phosphatase: 64 U/L (ref 38–126)
Anion gap: 9 (ref 5–15)
BILIRUBIN TOTAL: 0.6 mg/dL (ref 0.3–1.2)
BUN: 17 mg/dL (ref 8–23)
CO2: 25 mmol/L (ref 22–32)
Calcium: 8.8 mg/dL — ABNORMAL LOW (ref 8.9–10.3)
Chloride: 99 mmol/L (ref 98–111)
Creatinine, Ser: 0.61 mg/dL (ref 0.44–1.00)
GFR calc Af Amer: >60 mL/min (ref >60)
GFR calc non Af Amer: >60 mL/min (ref >60)
Glucose, Bld: 145 mg/dL — ABNORMAL HIGH (ref 70–99)
Potassium: 4.4 mmol/L (ref 3.5–5.1)
Sodium: 133 mmol/L — ABNORMAL LOW (ref 135–145)
TOTAL PROTEIN: 6.2 g/dL — AB (ref 6.5–8.1)

## 2018-04-27 LAB — CBC WITH DIFFERENTIAL/PLATELET
Abs Immature Granulocytes: 0.07 10*3/uL (ref 0.00–0.07)
Basophils Absolute: 0 10*3/uL (ref 0.0–0.1)
Basophils Relative: 0 %
Eosinophils Absolute: 0 10*3/uL (ref 0.0–0.5)
Eosinophils Relative: 0 %
HEMATOCRIT: 36.7 % (ref 36.0–46.0)
Hemoglobin: 12.3 g/dL (ref 12.0–15.0)
Immature Granulocytes: 1 %
Lymphocytes Relative: 10 %
Lymphs Abs: 1.1 10*3/uL (ref 0.7–4.0)
MCH: 30.2 pg (ref 26.0–34.0)
MCHC: 33.5 g/dL (ref 30.0–36.0)
MCV: 90.2 fL (ref 80.0–100.0)
Monocytes Absolute: 0.9 10*3/uL (ref 0.1–1.0)
Monocytes Relative: 8 %
Neutro Abs: 8.8 10*3/uL — ABNORMAL HIGH (ref 1.7–7.7)
Neutrophils Relative %: 81 %
Platelets: 419 10*3/uL — ABNORMAL HIGH (ref 150–400)
RBC: 4.07 MIL/uL (ref 3.87–5.11)
RDW: 12.7 % (ref 11.5–15.5)
WBC: 10.9 10*3/uL — ABNORMAL HIGH (ref 4.0–10.5)
nRBC: 0 % (ref 0.0–0.2)

## 2018-04-27 MED ORDER — PRO-STAT SUGAR FREE PO LIQD
30.0000 mL | Freq: Two times a day (BID) | ORAL | Status: DC
  Administered 2018-04-27 – 2018-05-06 (×19): 30 mL via ORAL
  Filled 2018-04-27 (×19): qty 30

## 2018-04-27 NOTE — Progress Notes (Signed)
Physical Therapy Assessment and Plan  Patient Details  Name: Cheryl Pearson MRN: [id number] Date of Birth: [birth year]  PT Diagnosis: Abnormality of gait, Difficulty walking, Impaired sensation and Muscle weakness Rehab Potential: Good ELOS: 7-10 days   Today's Date: 04/27/2018 PT Individual Time: 1110-1210 PT Individual Time Calculation (min): 60 min    Problem List:  Patient Active Problem List   Diagnosis Date Noted  . Essential hypertension   . Cervical spondylosis without myelopathy   . Steroid-induced hyperglycemia   . Postoperative pain   . Hyponatremia   . Leucocytosis   . Thoracic myelopathy 04/22/2018  . ALLERGIC RHINITIS 01/19/2007    Past Medical History:  Past Medical History:  Diagnosis Date  . Anxiety   . Arthritis   . Colitis   . Early age-related macular degeneration   . Hernia   . Hypertension   . Infertility, female   . Menopausal symptoms   . Rosacea   . Thyroid disease 1968   hypothyroidism treated with meds for short time then all labs normal   Past Surgical History:  Past Surgical History:  Procedure Laterality Date  . bone spur Left   . COLONOSCOPY  11/2011   polyp, ulcerative coliits inactive  . COLONOSCOPY  2005  . COLONOSCOPY  1970's   diagnosed with ulcerative colitis  . ESOPHAGOGASTRODUODENOSCOPY (EGD) WITH PROPOFOL N/A 11/20/2014   Procedure: ESOPHAGOGASTRODUODENOSCOPY (EGD) WITH PROPOFOL;  Surgeon: Laurence Spates, MD;  Location: [facility];  Service: Endoscopy;  Laterality: N/A;  . INGUINAL HERNIA REPAIR Right 62 mos old  . LAPAROSCOPIC CHOLECYSTECTOMY  09/1998  . SAVORY DILATION N/A 11/20/2014   Procedure: SAVORY DILATION;  Surgeon: Laurence Spates, MD;  Location: [facility];  Service: Endoscopy;  Laterality: N/A;  . WISDOM TOOTH EXTRACTION  1968    Assessment & Plan Clinical Impression: Cheryl Pearson a 78 year old right-handed female with past history of hypertension, anxiety. Per chart review patient lives with spouse. Independent  with assistive device. One level home with 2 steps to entry.Presented 04/22/2018 with progressive right side leg weakness and gait instability times several weeks. Patient also notes some mild incontinence of bladder. X-rays and imaging revealed a thoracic myelopathy T1-2/spondylolisthesis. Underwent T1-T2 laminectomy for decompression of spinal cord as well as pedicle screws posterior lateral arthrodesis 04/22/2018 per Dr. Kathyrn Sheriff. Hospital course pain management. Decadron protocol as indicated. Therapy evaluations completed with recommendations of physical medicine rehabilitation consult. Patient was admitted for a compress of rehabilitation program. Patient transferred to CIR on 04/26/2018 .   Patient currently requires min with mobility secondary to muscle weakness and muscle paralysis, decreased cardiorespiratoy endurance, unbalanced muscle activation and decreased coordination and decreased standing balance, decreased postural control, decreased balance strategies and difficulty maintaining precautions.  Prior to hospitalization, patient was modified independent  with mobility and lived with Spouse in a House home.  Home access is 3Stairs to enter.  Patient will benefit from skilled PT intervention to maximize safe functional mobility, minimize fall risk and decrease caregiver burden for planned discharge home with 24 hour supervision.  Anticipate patient will benefit from follow up [place] at discharge.  PT - End of Session Activity Tolerance: Tolerates 30+ min activity with multiple rests Endurance Deficit: Yes Endurance Deficit Description: generalized weakness, fatigues quickly with mobility activities PT Assessment Rehab Potential (ACUTE/IP ONLY): Good PT Barriers to Discharge: Home environment access/layout PT Patient demonstrates impairments in the following area(s): Balance;Safety;Sensory;Endurance;Motor;Pain PT Transfers Functional Problem(s): Bed Mobility;Bed to Chair;Car;Furniture PT  Locomotion Functional Problem(s): Stairs;Wheelchair Mobility;Ambulation PT Plan  PT Intensity: Minimum of 1-2 x/day ,45 to 90 minutes PT Frequency: 5 out of 7 days PT Duration Estimated Length of Stay: 7-10 days PT Treatment/Interventions: Ambulation/gait training;Community reintegration;DME/adaptive equipment instruction;Neuromuscular re-education;Stair training;UE/LE Strength taining/ROM;Psychosocial support;Wheelchair propulsion/positioning;UE/LE Coordination activities;Therapeutic Activities;Skin care/wound management;Pain management;Functional electrical stimulation;Discharge planning;Balance/vestibular training;Disease management/prevention;Functional mobility training;Cognitive remediation/compensation;Patient/family education;Splinting/orthotics;Therapeutic Exercise PT Transfers Anticipated Outcome(s): Supervision PT Locomotion Anticipated Outcome(s): supervision PT Recommendation Recommendations for Other Services: Therapeutic Recreation consult Therapeutic Recreation Interventions: Kitchen group;Outing/community reintergration Follow Up Recommendations: Home health PT;24 hour supervision/assistance Patient destination: Home Equipment Details: has RW  Skilled Therapeutic Intervention Pt received in bed, c/o pain as below and agreeable to treatment. Assessed all mobility as below with minA overall. Gait trials with PLS AFO and GRAFO to determine best fit as pt limited by drop foot; will require ongoing trials and monitoring for motor return, plan for best AFO prior to d/c. Educated pt in rehab process, goals, estimated LOS, with pt and husband agreeable to all the above. Remained in recliner at end of session, chair alarm intact and all needs in reach.   PT Evaluation Precautions/Restrictions Precautions Precautions: Back;Fall Required Braces or Orthoses: (no brace) Restrictions Weight Bearing Restrictions: No General Chart Reviewed: Yes Response to Previous Treatment: Patient  reporting fatigue but able to participate. Family/Caregiver Present: Yes Vital Signs Pain Pain Assessment Pain Scale: 0-10 Pain Score: 5  Pain Type: Surgical pain Pain Location: Back Pain Orientation: Upper Pain Descriptors / Indicators: Aching;Discomfort Pain Onset: On-going Patients Stated Pain Goal: 2 Pain Intervention(s): Repositioned Multiple Pain Sites: No Home Living/Prior Functioning Home Living Available Help at Discharge: Family Type of Home: House Home Access: Stairs to enter Technical brewer of Steps: 3 Entrance Stairs-Rails: Right;Left Home Layout: One level Bathroom Shower/Tub: Multimedia programmer: Standard  Lives With: Spouse Prior Function Level of Independence: Independent with basic ADLs;Independent with homemaking with ambulation;Requires assistive device for independence  Able to Take Stairs?: Yes Driving: No Vocation: Retired 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, reports independent with ADLs Vision/Perception  Perception Perception: Within Functional Limits Praxis Praxis: Intact  Cognition Overall Cognitive Status: Within Functional Limits for tasks assessed Arousal/Alertness: Awake/alert Orientation Level: Oriented X4 Selective Attention: Impaired Selective Attention Impairment: Verbal complex;Functional complex Memory: Appears intact Problem Solving: Impaired Problem Solving Impairment: Functional complex Executive Function: Sequencing;Organizing Sequencing: Impaired Sequencing Impairment: Functional complex Organizing: Impaired Organizing Impairment: Functional complex Safety/Judgment: Impaired Sensation Sensation Light Touch: Appears Intact Hot/Cold: Appears Intact Proprioception: Impaired by gross assessment(RLE hallux impaired) Coordination Gross Motor Movements are Fluid and Coordinated: No Fine Motor Movements are Fluid and Coordinated: Yes Coordination and Movement Description: limited by generalized weakness Finger  Nose Finger Test: [facility] Heel Shin Test: unable RLE d/t strength deficits Motor  Motor Motor: Other (comment) Motor - Skilled Clinical Observations: generalized weakness  Mobility Bed Mobility Bed Mobility: Rolling Right;Rolling Left;Supine to Sit Rolling Right: Minimal Assistance - Patient > 75% Rolling Left: Minimal Assistance - Patient > 75% Supine to Sit: Minimal Assistance - Patient > 75% Transfers Transfers: Sit to Stand;Stand Pivot Transfers;Stand to Sit Sit to Stand: Minimal Assistance - Patient > 75% Stand to Sit: Minimal Assistance - Patient > 75% Stand Pivot Transfers: Minimal Assistance - Patient > 75% Stand Pivot Transfer Details: Verbal cues for precautions/safety;Verbal cues for safe use of DME/AE Transfer (Assistive device): Rolling walker Locomotion  Gait Ambulation: Yes Gait Assistance: Minimal Assistance - Patient > 75% Gait Distance (Feet): 50 Feet Assistive device: Rolling walker Gait Assistance Details: Verbal cues for precautions/safety;Verbal cues for safe use of DME/AE;Tactile cues  for weight shifting Gait Gait: Yes Gait Pattern: Impaired Gait Pattern: Poor foot clearance - right;Right flexed knee in stance Gait velocity: decreased Stairs / Additional Locomotion Stairs: Yes Stairs Assistance: Minimal Assistance - Patient > 75% Stair Management Technique: Two rails;Step to pattern;Forwards Number of Stairs: 8 Height of Stairs: 3 Ramp: Minimal Assistance - Patient >75% Wheelchair Mobility Wheelchair Mobility: Yes Wheelchair Assistance: Development worker, international aid: Both upper extremities Wheelchair Parts Management: Needs assistance Distance: 100'  Trunk/Postural Assessment  Cervical Assessment Cervical Assessment: Exceptions to WFL(head forward) Thoracic Assessment Thoracic Assessment: Exceptions to WFL(spinal sx at T1-T2) Lumbar Assessment Lumbar Assessment: Within Functional Limits Postural Control Postural Control:  Within Functional Limits  Balance Balance Balance Assessed: Yes Static Sitting Balance Static Sitting - Balance Support: Feet supported Static Sitting - Level of Assistance: 5: Stand by assistance Dynamic Sitting Balance Dynamic Sitting - Balance Support: Feet supported Dynamic Sitting - Level of Assistance: 5: Stand by assistance Dynamic Sitting - Balance Activities: Reaching across midline Static Standing Balance Static Standing - Balance Support: During functional activity;Bilateral upper extremity supported Static Standing - Level of Assistance: 4: Min assist Dynamic Standing Balance Dynamic Standing - Balance Support: During functional activity Dynamic Standing - Level of Assistance: 4: Min assist Dynamic Standing - Balance Activities: Other (comment) Dynamic Standing - Comments: LB dressing Extremity Assessment  RUE Assessment RUE Assessment: Exceptions to [facility] General Strength Comments: Generalized weakness, strength not formally tested d/t high thoracic spinal sx LUE Assessment LUE Assessment: Exceptions to [facility] General Strength Comments: Generalized weakness, strength not formally tested d/t high thoracic spinal sx RLE Assessment RLE Assessment: Exceptions to [facility] RLE Strength RLE Overall Strength: Deficits Right Hip Flexion: 1/5 Right Hip Extension: 3-/5 Right Knee Flexion: 2/5 Right Knee Extension: 3-/5 Right Ankle Dorsiflexion: 0/5 Right Ankle Plantar Flexion: 3/5 LLE Assessment LLE Assessment: Exceptions to [facility] General Strength Comments: grossly 4+/5 throughout with exception of knee flexion 4-/5    Refer to Care Plan for Long Term Goals  Recommendations for other services: Therapeutic Recreation  Kitchen group and Outing/community reintegration  Discharge Criteria: Patient will be discharged from PT if patient refuses treatment 3 consecutive times without medical reason, if treatment goals not met, if there is a change in medical status, if patient makes no  progress towards goals or if patient is discharged from hospital.  The above assessment, treatment plan, treatment alternatives and goals were discussed and mutually agreed upon: by patient and by family  Corliss Skains 04/27/2018, 1:21 PM

## 2018-04-27 NOTE — Plan of Care (Signed)
  Problem: Consults Goal: RH SPINAL CORD INJURY PATIENT EDUCATION Description  See Patient Education module for education specifics.  Outcome: Progressing   Problem: RH SKIN INTEGRITY Goal: RH STG ABLE TO PERFORM INCISION/WOUND CARE W/ASSISTANCE Description STG Able To Perform Incision/Wound Care With min Assistance.  Outcome: Progressing   Problem: RH SAFETY Goal: RH STG ADHERE TO SAFETY PRECAUTIONS W/ASSISTANCE/DEVICE Description STG Adhere to Safety Precautions With mod I Assistance/Device.  Outcome: Progressing   Problem: RH PAIN MANAGEMENT Goal: RH STG PAIN MANAGED AT OR BELOW PT'S PAIN GOAL Description Less than 3 out of 10  Outcome: Progressing   Problem: RH KNOWLEDGE DEFICIT SCI Goal: RH STG INCREASE KNOWLEDGE OF SELF CARE AFTER SCI Outcome: Progressing

## 2018-04-27 NOTE — Progress Notes (Addendum)
[facility] PHYSICAL MEDICINE & REHABILITATION PROGRESS NOTE   Subjective/Complaints: Had a sharp shooting pain come into back when returning from bathroom. Has since subsided.otherwise feeling well  ROS: Patient denies fever, rash, sore throat, blurred vision, nausea, vomiting, diarrhea, cough, shortness of breath or chest pain, joint or back pain, headache, or mood change.    Objective:   No results found. Recent Labs    04/27/18 0513  WBC 10.9*  HGB 12.3  HCT 36.7  PLT 419*   Recent Labs    04/27/18 0513  NA 133*  K 4.4  CL 99  CO2 25  GLUCOSE 145*  BUN 17  CREATININE 0.61  CALCIUM 8.8*    Intake/Output Summary (Last 24 hours) at 04/27/2018 0953 Last data filed at 04/27/2018 0900 Gross per 24 hour  Intake 120 ml  Output -  Net 120 ml     Physical Exam: Vital Signs Blood pressure (!) 154/75, pulse 79, temperature 97.9 F (36.6 C), temperature source Oral, resp. rate 18, height 5\' 5"  (1.651 m), weight 72.2 kg, SpO2 97 %. Constitutional: No distress . Vital signs reviewed. HEENT: EOMI, oral membranes moist Neck: supple Cardiovascular: RRR without murmur. No JVD    Respiratory: CTA Bilaterally without wheezes or rales. Normal effort    GI: BS +, non-tender, non-distended  Musculoskeletal:back wound clean and intact. TTP Neurological: Patient is alert.  UE 5/5. RLE: HF/KE 2/5, APF 2/5, ADF tr/5. LLE: 3/5 HF, KE and 4/5 ADF/PF. decreased LT/PP RLE and up to umbilicus. DTR's tr LE's-- no neuro changes today    Assessment/Plan: 1. Functional deficits secondary to thoracic myelopathy which require 3+ hours per day of interdisciplinary therapy in a comprehensive inpatient rehab setting.  Physiatrist is providing close team supervision and 24 hour management of active medical problems listed below.  Physiatrist and rehab team continue to assess barriers to discharge/monitor patient progress toward functional and medical goals  Care Tool:  Bathing               Bathing assist       Upper Body Dressing/Undressing Upper body dressing        Upper body assist      Lower Body Dressing/Undressing Lower body dressing            Lower body assist       Toileting Toileting Toileting Activity did not occur (Clothing management and hygiene only): Safety/medical concerns  Toileting assist       Transfers Chair/bed transfer  Transfers assist  Chair/bed transfer activity did not occur: Safety/medical concerns        Locomotion Ambulation   Ambulation assist   Ambulation activity did not occur: Safety/medical concerns          Walk 10 feet activity   Assist           Walk 50 feet activity   Assist           Walk 150 feet activity   Assist           Walk 10 feet on uneven surface  activity   Assist           Wheelchair     Assist               Wheelchair 50 feet with 2 turns activity    Assist            Wheelchair 150 feet activity     Assist  Medical Problem List and Plan: 1.Decreased functional mobility with right lower extremity weaknesssecondary to cervical and thoracic myelopathy. Status post decompression T1-2. Continue slow taper of Decadron -beginning therapies -may benefit from Right AFO 2. DVT Prophylaxis/Anticoagulation: SCDs. Pending vascular study 3. Pain Management:Robaxin and oxycodone as needed  -back pain appears to be predominantly spastic 4. Mood:Provide emotional support 5. Neuropsych: This patientiscapable of making decisions on herown behalf. 6. Skin/Wound Care:Routine skin checks 7. Fluids/Electrolytes/Nutrition:encourage PO  -I personally reviewed the patient's labs today.    -protein supp for hypoalbuminemia  8. Hypertension. Norvasc 2.5 mg daily. Monitor with increased mobility 9. Hyperlipidemia. Zetia 10. Constipation. Laxative assistance, encourage appropriate PO intake 11.  Elevated LFT"s---likely reactive  -re-check Friday    LOS: 1 days A FACE TO FACE EVALUATION WAS PERFORMED  Ranelle Oyster 04/27/2018, 9:53 AM

## 2018-04-27 NOTE — Progress Notes (Signed)
Physical Therapy Session Note  Patient Details  Name: Cheryl Pearson MRN: [id number] Date of Birth: Mar 01, 1941  Today's Date: 04/27/2018 PT Individual Time: 1300-1400 PT Individual Time Calculation (min): 60 min   Short Term Goals: Week 1:  PT Short Term Goal 1 (Week 1): =LTG due to estimated LOS  Skilled Therapeutic Interventions/Progress Updates:  Patient received in recliner, husband present and observed session, willing to participate in therapy. Applied AFO with totalA for time today, then able to complete transfers with RW and MinA for safety and balance. Able to ambulate into bathroom with RW and MinA, toileted with MinA and close S, and then ambulated to [facility] with RW and MinA. Able to self-propel WC approximately 147f with B UEs, limited by fatigue. Performed multiple transfers to and from mat table with MinA, and participated well in functional exercises for glutes, hamstrings, hip abductors, and hip flexors. Education and demonstration provided on correct bed mobility to maintain precautions. Due to ongoing difficulty with toes scuffing/dragging in swing phase R LE even with AFO, applied toe cover and patient demonstrated improved R step length/reduced toe drag, gait distance with toe cover limited to [id number] to time restrictions of session. Verbal cues and demonstration provided throughout session to maintain precautions. She was left up in the recliner with all needs met and spouse present, seat belt alarm active this afternoon.   Therapy Documentation Precautions:  Precautions Precautions: Back, Fall Required Braces or Orthoses: (no brace) Restrictions Weight Bearing Restrictions: No General: Chart Reviewed: Yes Response to Previous Treatment: Patient reporting fatigue but able to participate. Family/Caregiver Present: Yes Pain: Pain Assessment Pain Scale: 0-10 Pain Score: 0-No pain     Therapy/Group: Individual Therapy  KrDeniece ReeT, DPT, CBIS  Supplemental  Physical Therapist [facility]  Pager [phone] [phone] 04/27/2018, 3:40 PM

## 2018-04-27 NOTE — Evaluation (Signed)
Occupational Therapy Assessment and Plan  Patient Details  Name: Cheryl Pearson MRN: [id number] Date of Birth: [birth year]  OT Diagnosis: muscle weakness (generalized) and pain in thoracic spine Rehab Potential: Rehab Potential (ACUTE ONLY): Good ELOS: 7-10 days   Today's Date: 04/27/2018 OT Individual Time: 7371-0626 OT Individual Time Calculation (min): 75 min     Problem List:  Patient Active Problem List   Diagnosis Date Noted  . Essential hypertension   . Cervical spondylosis without myelopathy   . Steroid-induced hyperglycemia   . Postoperative pain   . Hyponatremia   . Leucocytosis   . Thoracic myelopathy 04/22/2018  . ALLERGIC RHINITIS 01/19/2007    Past Medical History:  Past Medical History:  Diagnosis Date  . Anxiety   . Arthritis   . Colitis   . Early age-related macular degeneration   . Hernia   . Hypertension   . Infertility, female   . Menopausal symptoms   . Rosacea   . Thyroid disease 1968   hypothyroidism treated with meds for short time then all labs normal   Past Surgical History:  Past Surgical History:  Procedure Laterality Date  . bone spur Left   . COLONOSCOPY  11/2011   polyp, ulcerative coliits inactive  . COLONOSCOPY  2005  . COLONOSCOPY  1970's   diagnosed with ulcerative colitis  . ESOPHAGOGASTRODUODENOSCOPY (EGD) WITH PROPOFOL N/A 11/20/2014   Procedure: ESOPHAGOGASTRODUODENOSCOPY (EGD) WITH PROPOFOL;  Surgeon: Laurence Spates, MD;  Location: [facility];  Service: Endoscopy;  Laterality: N/A;  . INGUINAL HERNIA REPAIR Right 95 mos old  . LAPAROSCOPIC CHOLECYSTECTOMY  09/1998  . SAVORY DILATION N/A 11/20/2014   Procedure: SAVORY DILATION;  Surgeon: Laurence Spates, MD;  Location: [facility];  Service: Endoscopy;  Laterality: N/A;  . WISDOM TOOTH EXTRACTION  1968    Assessment & Plan Clinical Impression: Cheryl Pearson a 78 year old right-handed female with past history of hypertension, anxiety. Per chart review patient lives with spouse.  Independent with assistive device. One level home with 2 steps to entry.Presented 04/22/2018 with progressive right side leg weakness and gait instability times several weeks. Patient also notes some mild incontinence of bladder. X-rays and imaging revealed a thoracic myelopathy T1-2/spondylolisthesis. Underwent T1-T2 laminectomy for decompression of spinal cord as well as pedicle screws posterior lateral arthrodesis 04/22/2018 per Dr. Kathyrn Sheriff. Hospital course pain management. Decadron protocol as indicated. Therapy evaluations completed with recommendations of physical medicine rehabilitation consult. Patient was admitted for a compress of rehabilitation program..  Patient transferred to CIR on 04/26/2018 .    Patient currently requires min with basic self-care skills secondary to muscle weakness, decreased oxygen support, unbalanced muscle activation and decreased coordination, decreased awareness, decreased problem solving, decreased safety awareness and delayed processing and decreased sitting balance, decreased standing balance and decreased balance strategies.  Prior to hospitalization, patient could complete ADLs with modified independent .  Patient will benefit from skilled intervention to decrease level of assist with basic self-care skills and increase level of independence with iADL prior to discharge home with care partner.  Anticipate patient will require intermittent supervision and follow up home health.  OT - End of Session Activity Tolerance: Tolerates 10 - 20 min activity with multiple rests Endurance Deficit: Yes Endurance Deficit Description: generalized weakness OT Assessment Rehab Potential (ACUTE ONLY): Good OT Patient demonstrates impairments in the following area(s): Balance;Pain;Cognition;Safety;Endurance;Motor OT Basic ADL's Functional Problem(s): Grooming;Bathing;Dressing;Toileting OT Transfers Functional Problem(s): Toilet;Tub/Shower OT Additional Impairment(s): None OT  Plan OT Intensity: Minimum of 1-2 x/day, 45 to  90 minutes OT Frequency: 5 out of 7 days OT Duration/Estimated Length of Stay: 7-10 days OT Treatment/Interventions: Balance/vestibular training;Community reintegration;Disease mangement/prevention;Patient/family education;Self Care/advanced ADL retraining;Therapeutic Exercise;UE/LE Coordination activities;Cognitive remediation/compensation;Wheelchair propulsion/positioning;Discharge planning;DME/adaptive equipment instruction;Functional mobility training;Pain management;Psychosocial support;Therapeutic Activities;Skin care/wound managment;UE/LE Strength taining/ROM OT Self Feeding Anticipated Outcome(s): no goal set OT Basic Self-Care Anticipated Outcome(s): (S) OT Toileting Anticipated Outcome(s): (S) OT Bathroom Transfers Anticipated Outcome(s): (S) OT Recommendation Recommendations for Other Services: Neuropsych consult Patient destination: Home Follow Up Recommendations: Home health OT Equipment Recommended: Tub/shower seat   Skilled Therapeutic Intervention Pt seen for skilled OT evaluation. Edu provided to pt and husband re OT POC, ELOS, and rehab expectations. Pt completed bathing and dressing tasks as described below. Pt required increased time for processing through activities and preferred to have every task thoroughly discussed before initiating. Pt c/o pain in her incision throughout session, but reported that the shower alleviated pain greatly. Pt was introduced to the use of AE (reacher, sock aid) during dressing. Pt was left sitting up in recliner with all needs met, chair alarm belt fastened and pt edu on unit's fall policy.   OT Evaluation Precautions/Restrictions  Precautions Precautions: Back;Fall Restrictions Weight Bearing Restrictions: No General Chart Reviewed: Yes Family/Caregiver Present: Yes  Pain Pain Assessment Pain Scale: 0-10 Pain Score: 8  Pain Type: Surgical pain Pain Location: Neck Pain Orientation:  Medial Pain Descriptors / Indicators: Aching;Discomfort Pain Onset: On-going Pain Intervention(s): Emotional support;[place] expects to be discharged to:: Private residence Living Arrangements: Spouse/significant other Available Help at Discharge: Family Type of Home: House Home Access: Stairs to enter Technical brewer of Steps: 3 Entrance Stairs-Rails: Can reach both Home Layout: One level Bathroom Shower/Tub: Multimedia programmer: Standard  Lives With: Spouse IADL History Homemaking Responsibilities: Yes Meal Prep Responsibility: Secondary Laundry Responsibility: Secondary Cleaning Responsibility: Secondary Bill Paying/Finance Responsibility: Secondary Shopping Responsibility: Secondary Occupation: Retired Prior Function Level of Independence: Independent with basic ADLs, Independent with homemaking with ambulation, Requires assistive device for independence  Able to Take Stairs?: Yes Vocation: Retired 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, reports independent with ADLs ADL ADL Equipment Provided: Secondary school teacher, Sock aid Eating: Set up Where Assessed-Eating: Edge of bed Grooming: Supervision/safety Where Assessed-Grooming: Edge of bed Upper Body Bathing: Supervision/safety Where Assessed-Upper Body Bathing: Shower Lower Body Bathing: Minimal assistance, Minimal cueing Where Assessed-Lower Body Bathing: Shower Upper Body Dressing: Supervision/safety Where Assessed-Upper Body Dressing: Edge of bed Lower Body Dressing: Minimal assistance Where Assessed-Lower Body Dressing: Edge of bed Toileting: Minimal cueing, Minimal assistance Where Assessed-Toileting: Bedside Commode Toilet Transfer: Minimal assistance, Minimal verbal cueing Toilet Transfer Method: Counselling psychologist: Bedside commode, Grab bars Social research officer, government: Minimal cueing, Minimal assistance Social research officer, government Method:  Heritage manager: Radio broadcast assistant Vision Baseline Vision/History: Wears glasses Wears Glasses: At all times Patient Visual Report: No change from baseline Vision Assessment?: No apparent visual deficits Perception  Perception: Within Functional Limits Praxis Praxis: Intact Cognition Overall Cognitive Status: Impaired/Different from baseline Arousal/Alertness: Awake/alert Orientation Level: Person;Place;Situation Person: Oriented Place: Oriented Situation: Oriented Year: 2020 Month: January Day of Week: Correct Memory: Appears intact Immediate Memory Recall: Sock;Blue;Bed Memory Recall: Sock;Blue;Bed Memory Recall Sock: Without Cue Memory Recall Blue: Without Cue Memory Recall Bed: Without Cue Selective Attention: Impaired Selective Attention Impairment: Verbal complex;Functional complex Problem Solving: Impaired Problem Solving Impairment: Functional complex Executive Function: Sequencing;Organizing Sequencing: Impaired Sequencing Impairment: Functional complex Organizing: Impaired Organizing Impairment: Functional complex Safety/Judgment: Impaired Sensation Sensation Light Touch: Appears Intact Hot/Cold: Appears Intact  Coordination Gross Motor Movements are Fluid and Coordinated: No Fine Motor Movements are Fluid and Coordinated: Yes Coordination and Movement Description: limited by generalized weakness Finger Nose Finger Test: [facility] Motor  Motor Motor: Other (comment) Motor - Skilled Clinical Observations: generalized weakness Mobility  Bed Mobility Bed Mobility: Rolling Right;Rolling Left;Supine to Sit Rolling Right: Minimal Assistance - Patient > 75% Rolling Left: Minimal Assistance - Patient > 75% Supine to Sit: Minimal Assistance - Patient > 75% Transfers Sit to Stand: Minimal Assistance - Patient > 75% Stand to Sit: Minimal Assistance - Patient > 75%  Trunk/Postural Assessment  Cervical Assessment Cervical Assessment: Exceptions  to WFL(head forward) Thoracic Assessment Thoracic Assessment: Exceptions to WFL(spinal sx at T1-T2) Lumbar Assessment Lumbar Assessment: Within Functional Limits Postural Control Postural Control: Within Functional Limits  Balance Balance Balance Assessed: Yes Static Sitting Balance Static Sitting - Balance Support: Feet supported Static Sitting - Level of Assistance: 5: Stand by assistance Dynamic Sitting Balance Dynamic Sitting - Balance Support: Feet supported Dynamic Sitting - Level of Assistance: 5: Stand by assistance Dynamic Sitting - Balance Activities: Reaching across midline Static Standing Balance Static Standing - Balance Support: During functional activity;Bilateral upper extremity supported Static Standing - Level of Assistance: 4: Min assist Dynamic Standing Balance Dynamic Standing - Balance Support: During functional activity Dynamic Standing - Level of Assistance: 4: Min assist Dynamic Standing - Balance Activities: Other (comment) Dynamic Standing - Comments: LB dressing Extremity/Trunk Assessment RUE Assessment RUE Assessment: Exceptions to [facility] General Strength Comments: Generalized weakness, strength not formally tested d/t high thoracic spinal sx LUE Assessment LUE Assessment: Exceptions to [facility] General Strength Comments: Generalized weakness, strength not formally tested d/t high thoracic spinal sx     Refer to Care Plan for Long Term Goals  Recommendations for other services: Neuropsych   Discharge Criteria: Patient will be discharged from OT if patient refuses treatment 3 consecutive times without medical reason, if treatment goals not met, if there is a change in medical status, if patient makes no progress towards goals or if patient is discharged from hospital.  The above assessment, treatment plan, treatment alternatives and goals were discussed and mutually agreed upon: by patient and by family  Curtis Sites 04/27/2018, 12:21 PM

## 2018-04-27 NOTE — Progress Notes (Signed)
Bilateral lower extremity venous duplex has been completed.   Preliminary results in CV Proc.   Blanch Media 04/27/2018 10:55 AM

## 2018-04-28 ENCOUNTER — Inpatient Hospital Stay (HOSPITAL_COMMUNITY): Payer: Medicare Other | Admitting: Physical Therapy

## 2018-04-28 ENCOUNTER — Inpatient Hospital Stay (HOSPITAL_COMMUNITY): Payer: Medicare Other

## 2018-04-28 DIAGNOSIS — R748 Abnormal levels of other serum enzymes: Secondary | ICD-10-CM

## 2018-04-28 MED ORDER — PRESERVISION AREDS 2 PO CHEW
1.0000 | CHEWABLE_TABLET | Freq: Two times a day (BID) | ORAL | Status: DC
  Administered 2018-04-29 – 2018-05-05 (×13): 1 via ORAL
  Filled 2018-04-28 (×15): qty 1

## 2018-04-28 NOTE — Progress Notes (Signed)
Physical Therapy Session Note  Patient Details  Name: Cheryl Pearson MRN: [id number] Date of Birth: 01/13/1941  Today's Date: 04/28/2018 PT Individual Time: 1050-1201 and 1600-1645 PT Individual Time Calculation (min): 71 min and 45 min   Short Term Goals: Week 1:  PT Short Term Goal 1 (Week 1): =LTG due to estimated LOS  Skilled Therapeutic Interventions/Progress Updates:    session 1  Pt received sitting in recliner and agreeable to PT. But reports mild bacj and shoulder pain following AM therapies.   Stand pivot transfer with min assist from PT and UE support on RW. Pt transported to day room in WC.   Nustep reciprocal movement and endurance training 6 min + 3 min, with small rest break between bouts. Min cues throughout for improved hip and knee control to maintain neutral alignment.    Gait training with RW 2x 85ft and min assist from PT for safety. Min-moderate cues for improved step height and width on the RLE. Pt able to correct height 75% following cues from PT.   Standing balance to perform reaching task to press target on dynavision, x2 with 1 UE support 56 with the RUE and 56 with the LUE. x2 without UE support 58 with the RUE and 58 with the LUE. CGA with 1 UE support on RW and min assist without UE support. Pt noted to catch anterior LOB on Dynavision  Intermittently when no UE support on RW>   PT instructed pt in BLE strengthening:  Standing hip abduction x 5 BLE.  LAQ 2x 10 BLE  Heel slides with pillow case over foot x 10 BLE.  AAROM to complete full range with hee slides and min assist to improve R glute activation in stance for hip abduction.   Patient returned to room and left sitting in WC with call bell in reach and all needs met.      Session 2.   Pt received sitting in recliner and agreeable to PT. Stand pivot transfer to WC with min assist from PT and UE support on RW. Pt transported to rehab gym.  Stand pivot to mat table with min assist and min cues for  proper UE placement for safety. Sit>supine with min assist to control the RLE as well as cues to maintain back precautions.   PT instructed pt in supine NMR:  Bridges x 8.  Heel slides x 10 BLE Hip abduction with level 2 tband x 12BLE Isometric hip adductionwith ball squeeze x 10 BLE  SLR x 8 BLE with AAROM on the R.   PT  instructed pt in gait training with RW  X 150ft and min assist from PT. Moderate cues for step height on the RLE throughout gait training with decreased clearance compared to AM treatment session.   Patient returned to room and performed stand pivot transfer to recliner. Pt left sitting in recliner  with call bell in reach and all needs met.            Therapy Documentation Precautions:  Precautions Precautions: Back, Fall Required Braces or Orthoses: (no brace) Restrictions Weight Bearing Restrictions: No   Pain: Pain Assessment Pain Scale: 0-10 Pain Score: 7  Pain Type: Surgical pain Pain Location: Back Pain Orientation: Upper Pain Descriptors / Indicators: Aching Pain Frequency: Constant Pain Onset: On-going Pain Intervention(s): Medication (See eMAR)  Therapy/Group: Individual Therapy  Austin E Tucker 04/28/2018, 1:39 PM  

## 2018-04-28 NOTE — Progress Notes (Signed)
Occupational Therapy Session Note  Patient Details  Name: Cheryl Pearson MRN: [id number] Date of Birth: 1941/03/02  Today's Date: 04/28/2018 OT Individual Time: 1300-1345 OT Individual Time Calculation (min): 45 min    Short Term Goals: Week 1:  OT Short Term Goal 1 (Week 1): Pt will sequence through walk in shower transfer with no vc OT Short Term Goal 2 (Week 1): Pt will complete LB dressing with LRAD with CGA OT Short Term Goal 3 (Week 1): Pt will complete functional mobility to toilet with CGA and LRAD  Skilled Therapeutic Interventions/Progress Updates:    OT intervention with focus on sit<>stand, standing balance, functional transfers, functional amb with RW, discharge planning, activity tolerance, and safety awareness to increase independence with BADLs. Pt required min A/CGA for sit<>stand with min verbal cues for RLE placement prior to sit>stand.  Pt amb with RW approx 5' in room and ADL apartment X 2 with CGA.  Pt required mod verbal cues for sequencing when turning to backup when sitting.  Pt required assistance raising RLE into/out of bed during bed transfers. Demonstrated shower seat and provided home measurement sheet to be completed and returned. Pt returned to room and amb approx 10' into room and to recliner.  Pt remained in recliner with husband present.   Therapy Documentation Precautions:  Precautions Precautions: Back, Fall Required Braces or Orthoses: (no brace) Restrictions Weight Bearing Restrictions: No   Pain: Pain Assessment Pain Scale: 0-10 Pain Score: 5 Pain Type: Surgical pain Pain Location: Back Pain Orientation: Upper Pain Descriptors / Indicators: Aching Pain Frequency: Constant Pain Onset: On-going Pain Intervention(s): Pain meds admin prior to therapy  Therapy/Group: Individual Therapy  Rich Brave 04/28/2018, 2:52 PM

## 2018-04-28 NOTE — Progress Notes (Signed)
[facility] Individual Statement of Services  Patient Name:  Cheryl Pearson  Date:  04/28/2018  Welcome to the [facility].  Our goal is to provide you with an individualized program based on your diagnosis and situation, designed to meet your specific needs.  With this comprehensive rehabilitation program, you will be expected to participate in at least 3 hours of rehabilitation therapies Monday-Friday, with modified therapy programming on the weekends.  Your rehabilitation program will include the following services:  Physical Therapy (PT), Occupational Therapy (OT), 24 hour per day rehabilitation nursing, Case Management (Social Worker), Rehabilitation Medicine, Nutrition Services and Pharmacy Services  Weekly team conferences will be held on Tuesdays to discuss your progress.  Your Social Worker will talk with you frequently to get your input and to update you on team discussions.  Team conferences with you and your family in attendance may also be held.  Expected length of stay:  7 to 10 days  Overall anticipated outcome:  Supervision  Depending on your progress and recovery, your program may change. Your Social Worker will coordinate services and will keep you informed of any changes. Your Social Worker's name and contact numbers are listed  below.  The following services may also be recommended but are not provided by the [facility]:   Driving Evaluations  Home Health Rehabiltiation Services  Outpatient Rehabilitation Services   Arrangements will be made to provide these services after discharge if needed.  Arrangements include referral to agencies that provide these services.  Your insurance has been verified to be:  [employer] Your primary doctor is:  Dr. Merlene Laughter  Pertinent information will be shared with your doctor and your insurance company.  Social Worker:  Staci Acosta, LCSW  [phone] or  ([phone]  Information discussed with and copy given to patient by: Elvera Lennox, 04/28/2018, 2:25 PM

## 2018-04-28 NOTE — Progress Notes (Signed)
Marcello FennelPatel, Ankit Anil, MD  Physician  Physical Medicine and Rehabilitation  Consult Note  Signed  Date of Service:  04/23/2018 12:27 PM       Related encounter: [facility] (Discharged) [facility]      Signed      Expand All Collapse All    Show:Clear all [x] Manual[x] Template[x] Copied  Added by: [x] Marcello FennelPatel, Ankit Anil, MD  [] Hover for details      Physical Medicine and Rehabilitation Consult Reason for Consult: Gait instability Referring Physician: Alyson Inglesostella, Vincent J, PA-C   HPI: Cheryl Pearson is a 78 y.o. female with past medical history of essential hypertension, arthritis, anxiety presented on 04/22/2018 for thoracic myelopathy status post decompression and stabilization T1-T2.  History taken from chart review, patient, and husband.  Patient had progressive right-sided leg weakness and instability times several weeks.  Husband states that patient initially was using a cane and required a walker, and most recently needed a wheelchair for mobility.  MRI ordered, reviewed, showing disc and facet degeneration at T1-T2.  MRI and C-spine also ordered, which was suggestive of myelopathy.  She underwent after mentioned procedure.  Hospital course complicated by HTN, steroid-induced hyperglycemia, postop pain, hyponatremia, leukocytosis.   Review of Systems  Constitutional: Positive for malaise/fatigue.  Respiratory: Positive for shortness of breath.   Gastrointestinal: Positive for constipation.  Genitourinary:       Incontinence  Musculoskeletal: Positive for back pain, joint pain and myalgias.  Neurological: Positive for tingling, focal weakness and weakness.  All other systems reviewed and are negative.      Past Medical History:  Diagnosis Date  . Anxiety   . Arthritis   . Colitis   . Early age-related macular degeneration   . Hernia   . Hypertension   . Infertility, female   . Menopausal symptoms    . Rosacea   . Thyroid disease 1968   hypothyroidism treated with meds for short time then all labs normal   Past Surgical History:  Procedure Laterality Date  . bone spur Left   . COLONOSCOPY  11/2011   polyp, ulcerative coliits inactive  . COLONOSCOPY  2005  . COLONOSCOPY  1970's   diagnosed with ulcerative colitis  . ESOPHAGOGASTRODUODENOSCOPY (EGD) WITH PROPOFOL N/A 11/20/2014   Procedure: ESOPHAGOGASTRODUODENOSCOPY (EGD) WITH PROPOFOL;  Surgeon: Carman ChingJames Edwards, MD;  Location: [facility];  Service: Endoscopy;  Laterality: N/A;  . INGUINAL HERNIA REPAIR Right 6618 mos old  . LAPAROSCOPIC CHOLECYSTECTOMY  09/1998  . SAVORY DILATION N/A 11/20/2014   Procedure: SAVORY DILATION;  Surgeon: Carman ChingJames Edwards, MD;  Location: [facility];  Service: Endoscopy;  Laterality: N/A;  . WISDOM TOOTH EXTRACTION  1968        Family History  Problem Relation Age of Onset  . Cancer Mother        uterine cancer  . Osteoarthritis Mother   . Rheum arthritis Mother   . COPD Mother   . COPD Father   . COPD Brother   . Cancer Maternal Aunt        colon cancer   Social History:  reports that she has never smoked. She has never used smokeless tobacco. She reports current alcohol use of about 1.0 standard drinks of alcohol per week. She reports that she does not use drugs. Allergies:       Allergies  Allergen Reactions  . Yellow Jacket Venom [Bee Venom] Anaphylaxis  . Crestor [Rosuvastatin] Other (See Comments)    aches  .  Lipitor [Atorvastatin] Other (See Comments)    Joint pain.  . Statins Other (See Comments)    aches  . Welchol [Colesevelam Hcl] Other (See Comments)    aches  . Zocor [Simvastatin] Other (See Comments)    aches         Medications Prior to Admission  Medication Sig Dispense Refill  . amLODipine (NORVASC) 2.5 MG tablet Take 2.5 mg by mouth daily.    Marland Kitchen ezetimibe (ZETIA) 10 MG tablet Take 10 mg by mouth daily.    Marland Kitchen ibuprofen  (ADVIL,MOTRIN) 200 MG tablet Take 200 mg by mouth every 6 (six) hours as needed for headache or moderate pain.     . Multiple Vitamins-Minerals (PRESERVISION AREDS 2 PO) Take 1 capsule by mouth 2 (two) times daily.    Marland Kitchen Propylene Glycol (SYSTANE BALANCE) 0.6 % SOLN Place 1-2 drops into both eyes daily as needed (for dry eyes).    Marland Kitchen EPINEPHrine (EPIPEN 2-PAK) 0.3 mg/0.3 mL IJ SOAJ injection Inject 0.3 mLs (0.3 mg total) into the muscle once. (Patient not taking: Reported on 04/13/2018) 1 Device 1    Home: Home Living Family/patient expects to be discharged to:: Private residence Living Arrangements: Spouse/significant other Available Help at Discharge: Family Type of Home: House Home Access: Stairs to enter Secretary/administrator of Steps: 2 Entrance Stairs-Rails: Can reach both Home Layout: One level Bathroom Shower/Tub: Engineer, manufacturing systems: Standard Home Equipment: Environmental consultant - standard, Environmental consultant - 4 wheels, Bedside commode  Functional History: Prior Function Level of Independence: Independent with assistive device(s) Comments: uses an standard RW, reports independent with ADLs Functional Status:  Mobility: Bed Mobility General bed mobility comments: sitting in recliner upon entry Transfers Overall transfer level: Needs assistance Equipment used: Rolling walker (2 wheeled) Transfers: Sit to/from Stand Sit to Stand: Min guard General transfer comment: Min guard for safety, Max cues for compliance with precautions Ambulation/Gait Ambulation/Gait assistance: Min guard Gait Distance (Feet): 160 Feet Assistive device: Rolling walker (2 wheeled) Gait Pattern/deviations: Step-through pattern, Decreased stride length, Shuffle, Drifts right/left General Gait Details: Patient cues for upright posture and positioning as well as increased cadence. Patient reluctant to comply with cues or precautions Gait velocity: decreased Gait velocity interpretation: <1.8 ft/sec,  indicate of risk for recurrent falls  ADL: ADL Overall ADL's : Needs assistance/impaired Grooming: Minimal assistance, Standing, Cueing for compensatory techniques Grooming Details (indicate cue type and reason): cueing for compensatory techniques, multiple cues to avoid foward lean with elbows on sink with poor compliance Upper Body Bathing: Set up, Sitting Lower Body Bathing: Sit to/from stand, Cueing for compensatory techniques, Cueing for back precautions, Min guard Upper Body Dressing : Set up, Sitting Lower Body Dressing: Sit to/from stand, Cueing for back precautions, Cueing for compensatory techniques, Min guard Lower Body Dressing Details (indicate cue type and reason): pt demonstrates ability to complete figure 4 technique while seated to reach B feet Toilet Transfer: Ambulation, RW, Comfort height toilet, Grab bars, Min guard Toileting- Clothing Manipulation and Hygiene: Cueing for compensatory techniques, Cueing for back precautions, Supervision/safety Tub/ Engineer, structural: Min guard, Walk-in shower, Cueing for safety, Cueing for sequencing, Ambulation, 3 in 1, Rolling walker Tub/Shower Transfer Details (indicate cue type and reason): simulated shower transfer with reverse step given min guard for safety, cueing for technique  Functional mobility during ADLs: Min guard, Rolling walker General ADL Comments: min guard for safety, cueing for precautions, and education provided on compensatory techniques   Cognition: Cognition Overall Cognitive Status: Impaired/Different from baseline Orientation Level: Oriented X4  Cognition Arousal/Alertness: Awake/alert Behavior During Therapy: Anxious Overall Cognitive Status: Impaired/Different from baseline Area of Impairment: Problem solving Problem Solving: Requires verbal cues, Requires tactile cues, Slow processing General Comments: requires increased time and effort for all activities, requires cueing for precautions with poor  compliance   Blood pressure (!) 150/52, pulse 89, temperature 98.7 F (37.1 C), temperature source Oral, resp. rate 16, height 5\' 5"  (1.651 m), weight 72.6 kg, SpO2 98 %. Physical Exam  Constitutional: She appears well-developed and well-nourished.  HENT:  Head: Normocephalic and atraumatic.  Eyes: EOM are normal. Right eye exhibits no discharge. Left eye exhibits no discharge.  Neck: Normal range of motion. Neck supple.  Cardiovascular: Normal rate and regular rhythm.  Respiratory: Effort normal and breath sounds normal.  GI: Soft. Bowel sounds are normal.  Musculoskeletal:     Comments: No edema or tenderness in extremities  Neurological: She is alert.  Motor: Left upper extremity: 5/5 proximal distal Right upper extremity: 4/5 proximal distal Left lower extremity: Hip flexion 4/5, knee extension 4+/5, ankle dorsiflexion 5/5 Right lower extremity: Hip flexion 2+/5, knee extension 2+/5, ankle dorsiflexion 4/5 Sensation intact light touch  Skin: Skin is warm and dry.  Psychiatric: She has a normal mood and affect. Her behavior is normal.          Assessment/Plan: Diagnosis: Cervical and thoracic myelopathy status post thoracic decompression Labs and images (see above) independently reviewed.  Records reviewed and summated above.  1. Does the need for close, 24 hr/day medical supervision in concert with the patient's rehab needs make it unreasonable for this patient to be served in a less intensive setting? No  2. Co-Morbidities requiring supervision/potential complications: HTN (monitor and provide prns in accordance with increased physical exertion and pain), steroid-induced hyperglycemia (Monitor in accordance with exercise and adjust meds as necessary), postop pain (Biofeedback training with therapies to help reduce reliance on opiate pain medications, monitor pain control during therapies, and sedation at rest and titrate to maximum efficacy to ensure participation and gains in  therapies), hyponatremia (continue to monitor, treat if necessary), leukocytosis (repeat labs, cont to monitor for signs and symptoms of infection, further workup if indicated) 3. Due to safety, skin/wound care, disease management, pain management and patient education, does the patient require 24 hr/day rehab nursing? No 4. Does the patient require coordinated care of a physician, rehab nurse, PT (1-2 hrs/day, 5 days/week) and OT (1-2 hrs/day, 5 days/week) to address physical and functional deficits in the context of the above medical diagnosis(es)? No Addressing deficits in the following areas: balance, endurance, locomotion, strength, transferring, bathing, dressing, toileting and psychosocial support 5. Can the patient actively participate in an intensive therapy program of at least 3 hrs of therapy per day at least 5 days per week? Yes 6. The potential for patient to make measurable gains while on inpatient rehab is good and fair 7. Anticipated functional outcomes upon discharge from inpatient rehab are supervision  with PT, supervision with OT, n/a with SLP. 8. Estimated rehab length of stay to reach the above functional goals is: 12-15 days. 9. Anticipated D/C setting: Home 10. Anticipated post D/C treatments: HH therapy and Home excercise program 11. Overall Rehab/Functional Prognosis: good  RECOMMENDATIONS: This patient's condition is appropriate for continued rehabilitative care in the following setting: Patient doing well with PT/OT on day evaluation and will not require CIR.  Recommend home with home health.  Would also consider/discuss plans for cervical myelopathy. Patient has agreed to participate in recommended program. Potentially  Note that insurance prior authorization may be required for reimbursement for recommended care.  Comment: Rehab Admissions Coordinator to follow up.   Maryla MorrowAnkit Patel, MD, ABPMR 04/23/2018        Routing History

## 2018-04-28 NOTE — Progress Notes (Signed)
Per nursing, patient was given "Data Collection Information Summary for Patients in Inpatient Rehabilitation Facilities with attached Privacy Act Statement Health Care Records" upon admission.    Patient information reviewed and entered into [facility] by Becky Miguelina Fore, PPS coordinator. Information including medical coding, function ability, and quality indicators will be reviewed and updated through discharge.   

## 2018-04-28 NOTE — Progress Notes (Signed)
Retta Diones, RN  Rehab Admission Coordinator  Physical Medicine and Rehabilitation  PMR Pre-admission  Signed  Date of Service:  04/26/2018 1:49 PM       Related encounter: Admission (Discharged) from 04/22/2018 in [facility]         Show:Clear all _0 Manual_1 Template_2 Copied  Added by: _3 Retta Diones, RN  _4 Hover for details PMR Admission Coordinator Pre-Admission Assessment  Patient: Cheryl Pearson is an 78 y.o., female MRN: [id number] DOB: Jan 07, 1941 Height: _5  (165.1 cm) Weight: 72.6 kg                                                                                                                                                  Insurance Information HMO:     PPO: Yes     PCP:       IPA:       80/20:       OTHER:  Group # [id number] PRIMARY: U[employer] Medicare      Policy#: [id number]      Subscriber: patient CM Name: Abigail Butts      Phone#: [phone]     Fax#: [phone] Pre-Cert#: [id number] for 7 days with follow up to Dorthula Nettles at fax [phone]      Employer: [employer] Benefits:  Phone #: [phone]     Name:  Telephone call - Lilly Eff. Date: 04/27/17     Deduct:  $0      Out of Pocket Max: $4000 (met $724.15)      Life Max: N/A CIR: $160 days 1-10      SNF:  $0 days 1-20; $50 days 21-100 Outpatient: medical necessity     Co-Pay: $20/visit Home Health: 100%      Co-Pay: none DME: 80%     Co-Pay: 20% Providers: in network  Medicaid Application Date:        Case Manager:   Disability Application Date:        Case Worker:    [employer] copy Information    Name Relation Home Work Mobile   Rounds,Richard Spouse [phone]  [phone]     Current Medical History  Patient Admitting Diagnosis:Cervical and thoracic myelopathy status post thoracic decompression    History of Present Illness: A 78 year old right-handed female with past history of hypertension,  anxiety. Per chart review patient lives with spouse. Independent with assistive device. One level home with 2 steps to entry.Presented 04/22/2018 with progressive right side leg weakness and gait instability times several weeks. Patient also notes some mild incontinence of bladder. X-rays and imaging revealed a thoracic myelopathy T1-2/spondylolisthesis. Underwent T1-T2 laminectomy for decompression of spinal cord as well as pedicle screws posterior lateral arthrodesis 04/22/2018 per Dr. Kathyrn Sheriff. Hospital course pain management. Decadron protocol as  indicated. Therapy evaluations completed with recommendations of physical medicine rehabilitation consult. Patient to be admitted for a comprehensive inpatient rehabilitation program.   Past Medical History      Past Medical History:  Diagnosis Date  . Anxiety   . Arthritis   . Colitis   . Early age-related macular degeneration   . Hernia   . Hypertension   . Infertility, female   . Menopausal symptoms   . Rosacea   . Thyroid disease 1968   hypothyroidism treated with meds for short time then all labs normal    Family History  family history includes COPD in her brother, father, and mother; Cancer in her maternal aunt and mother; Osteoarthritis in her mother; Rheum arthritis in her mother.  Prior Rehab/Hospitalizations: [facility] to outpatient therapy for balance in Nov 2019.  Has the patient had major surgery during 100 days prior to admission? No  Current Medications   Current Facility-Administered Medications:  .  0.9 %  sodium chloride infusion, , Intravenous, Continuous, Nundkumar, Neelesh, MD .  0.9 %  sodium chloride infusion, 250 mL, Intravenous, Continuous, Consuella Lose, MD .  acetaminophen (TYLENOL) tablet 650 mg, 650 mg, Oral, Q4H PRN, 650 mg at 04/26/18 0620 **OR** acetaminophen (TYLENOL) suppository 650 mg, 650 mg, Rectal, Q4H PRN, Consuella Lose, MD .  amLODipine (NORVASC) tablet 2.5 mg, 2.5 mg, Oral,  Daily, Consuella Lose, MD .  bisacodyl (DULCOLAX) suppository 10 mg, 10 mg, Rectal, Daily PRN, Consuella Lose, MD .  dexamethasone (DECADRON) injection 4 mg, 4 mg, Intravenous, Q12H, Costella, Vincent J, PA-C, 4 mg at 04/26/18 1058 .  docusate sodium (COLACE) capsule 100 mg, 100 mg, Oral, BID, Consuella Lose, MD, 100 mg at 04/25/18 0844 .  ezetimibe (ZETIA) tablet 10 mg, 10 mg, Oral, Daily, Nundkumar, Nena Polio, MD .  menthol-cetylpyridinium (CEPACOL) lozenge 3 mg, 1 lozenge, Oral, PRN **OR** phenol (CHLORASEPTIC) mouth spray 1 spray, 1 spray, Mouth/Throat, PRN, Consuella Lose, MD .  methocarbamol (ROBAXIN) tablet 750 mg, 750 mg, Oral, Q6H PRN, 750 mg at 04/26/18 0215 **OR** methocarbamol (ROBAXIN) 750 mg in dextrose 5 % 50 mL IVPB, 750 mg, Intravenous, Q6H PRN, Costella, Vincent J, PA-C .  multivitamin (PROSIGHT) tablet 1 tablet, 1 tablet, Oral, Daily, Nundkumar, Nena Polio, MD .  ondansetron (ZOFRAN) tablet 4 mg, 4 mg, Oral, Q6H PRN **OR** ondansetron (ZOFRAN) injection 4 mg, 4 mg, Intravenous, Q6H PRN, Consuella Lose, MD .  oxyCODONE (Oxy IR/ROXICODONE) immediate release tablet 10 mg, 10 mg, Oral, Q3H PRN, Consuella Lose, MD, 10 mg at 04/24/18 0343 .  oxyCODONE (Oxy IR/ROXICODONE) immediate release tablet 5 mg, 5 mg, Oral, Q3H PRN, Consuella Lose, MD, 5 mg at 04/26/18 0216 .  pantoprazole (PROTONIX) EC tablet 40 mg, 40 mg, Oral, QHS, Consuella Lose, MD, 40 mg at 04/25/18 2122 .  polyethylene glycol (MIRALAX / GLYCOLAX) packet 17 g, 17 g, Oral, Daily PRN, Consuella Lose, MD .  polyvinyl alcohol (LIQUIFILM TEARS) 1.4 % ophthalmic solution 1 drop, 1 drop, Both Eyes, Daily PRN, Consuella Lose, MD .  senna (SENOKOT) tablet 8.6 mg, 1 tablet, Oral, BID, Consuella Lose, MD, 8.6 mg at 04/25/18 0843 .  sodium chloride flush (NS) 0.9 % injection 3 mL, 3 mL, Intravenous, Q12H, Consuella Lose, MD, 3 mL at 04/25/18 0844 .  sodium chloride flush (NS) 0.9 % injection 3  mL, 3 mL, Intravenous, PRN, Consuella Lose, MD .  sodium phosphate (FLEET) 7-19 GM/118ML enema 1 enema, 1 enema, Rectal, Once PRN, Consuella Lose, MD  Patients Current Diet:  Diet Order                  Diet regular Room service appropriate? Yes; Fluid consistency: Thin  Diet effective now               Precautions / Restrictions Precautions Precautions: Back Precaution Booklet Issued: Yes (comment) Precaution Comments: Will need continuing reinforcement of back precautions; noting not a lot of carryover from yesterday's session Restrictions Weight Bearing Restrictions: No   Has the patient had 2 or more falls or a fall with injury in the past year?No.  Patient reports 1 fall last March 2019.  Prior Activity Level Community (5-7x/wk): [facility] out daily, was driving up until Nov. 2019  [place] / [place] Devices/Equipment: Kasandra Knudsen (specify quad or straight), Gilford Rile (specify type), Eyeglasses Home Equipment: Gilford Rile - standard, Walker - 4 wheels, Bedside commode  Prior Device Use: Indicate devices/aids used by the patient prior to current illness, exacerbation or injury? Walker and [employer]  Prior Functional Level Prior Function Level of Independence: Independent with assistive device(s) Comments: uses an standard RW, reports independent with ADLs  Self Care: Did the patient need help bathing, dressing, using the toilet or eating?  Independent  Indoor Mobility: Did the patient need assistance with walking from room to room (with or without device)? Independent  Stairs: Did the patient need assistance with internal or external stairs (with or without device)? Independent  Functional Cognition: Did the patient need help planning regular tasks such as shopping or remembering to take medications? Independent  Current Functional Level Cognition  Overall Cognitive Status: Impaired/Different from baseline Orientation Level:  Oriented X4 General Comments: Pt indicated she did not remember aspects of yesterday's PT session    Extremity Assessment (includes Sensation/Coordination)  Upper Extremity Assessment: Overall WFL for tasks assessed  Lower Extremity Assessment: Defer to PT evaluation RLE Deficits / Details: noted RLE weakness reported per patient, appears functional upon testing grossly 4/5 all motions including dorsiflexion. no evidence of foot drop as previously indicated by patient RLE Sensation: history of peripheral neuropathy    ADLs  Overall ADL's : Needs assistance/impaired Grooming: Minimal assistance, Standing, Cueing for compensatory techniques Grooming Details (indicate cue type and reason): cueing for compensatory techniques, multiple cues to avoid foward lean with elbows on sink with poor compliance Upper Body Bathing: Set up, Sitting Lower Body Bathing: Sit to/from stand, Cueing for compensatory techniques, Cueing for back precautions, Min guard Upper Body Dressing : Set up, Sitting Lower Body Dressing: Sit to/from stand, Cueing for back precautions, Cueing for compensatory techniques, Min guard Lower Body Dressing Details (indicate cue type and reason): pt demonstrates ability to complete figure 4 technique while seated to reach B feet Toilet Transfer: Ambulation, RW, Comfort height toilet, Min guard, BSC Toilet Transfer Details (indicate cue type and reason): 3n1 over the commode Toileting- Clothing Manipulation and Hygiene: Cueing for compensatory techniques, Cueing for back precautions, Supervision/safety Tub/ Shower Transfer: Min guard, Walk-in shower, Cueing for safety, Cueing for sequencing, Ambulation, 3 in 1, Rolling walker Tub/Shower Transfer Details (indicate cue type and reason): simulated shower transfer with reverse step given min guard for safety, cueing for technique  Functional mobility during ADLs: Min guard, Rolling walker General ADL Comments: min guard for safety,  cueing for precautions, pt. moving VERY slow    Mobility  General bed mobility comments: sitting in recliner upon entry    Transfers  Overall transfer level: Needs assistance Equipment used: Rolling walker (2 wheeled) Transfers: Sit to/from  Stand Sit to Stand: Min assist Stand pivot transfers: Min assist General transfer comment: min assist to steady; cues for hand placement and safety    Ambulation / Gait / Stairs / Wheelchair Mobility  Ambulation/Gait Ambulation/Gait assistance: Min assist, Mod assist, +2 safety/equipment Gait Distance (Feet): (Hallway ambulation) Assistive device: Rolling walker (2 wheeled) Gait Pattern/deviations: Step-through pattern, Decreased dorsiflexion - right, Drifts right/left General Gait Details: Improvements in gait distance and activity tolerance over yesterday; Still with decr dorsiflexion R, requires assist for RLE advancement approx 50% of steps; close guard of R knee for stabiltiy in stance; Less buckling than previous session, but still with 5-6 instances of R knee buckle, requiring mod assist to prevent fall; 2 person assist for safety, and husband pushed chair behind Gait velocity: decreased Gait velocity interpretation: <1.8 ft/sec, indicate of risk for recurrent falls    Posture / Balance Balance Overall balance assessment: Needs assistance Sitting-balance support: No upper extremity supported, Feet supported Sitting balance-Leahy Scale: Good Standing balance support: Bilateral upper extremity supported, During functional activity Standing balance-Leahy Scale: Poor Standing balance comment: reliant on UE support    Special needs/care consideration BiPAP/CPAP No CPM No Continuous Drip IV No Dialysis No        Life Vest No Oxygen No Special Bed No Trach Size no Wound Vac (area) No      Skin thoracic back post op incision                            Bowel mgmt: Last BM 04/25/18 Bladder mgmt: BRP with assistance Diabetic mgmt  No    Previous Home Environment Living Arrangements: Spouse/significant other Available Help at Discharge: Family Type of Home: House Home Layout: One level Home Access: Stairs to enter Entrance Stairs-Rails: Can reach both Entrance Stairs-Number of Steps: 2 Bathroom Shower/Tub: Chiropodist: Standard Home Care Services: No  Discharge Living Setting Plans for Discharge Living Setting: Patient's home, House, Lives with (comment)(Lives with husband.) Type of Home at Discharge: House Discharge Home Layout: One level Discharge Home Access: Stairs to enter Entrance Stairs-Number of Steps: 3 steps Discharge Bathroom Shower/Tub: Walk-in shower, Curtain Discharge Bathroom Toilet: Standard Discharge Bathroom Accessibility: No(Narrow bathroom) Does the patient have any problems obtaining your medications?: No  Social/Family/Support Systems Patient Roles: Spouse, Parent(Has a husband and a daughter.) Contact Information: Shawnee Knapp - husband Anticipated Caregiver: Richard Anticipated Caregiver's Contact Information: Jasmyne Lodato - spouse - [phone] Ability/Limitations of Caregiver: Husband is retired and can assist.  He has been staying with patient in the hospital.  Daughter lives in [place].  Caregiver Availability: 24/7 Discharge Plan Discussed with Primary Caregiver: Yes Is Caregiver In Agreement with Plan?: Yes Does Caregiver/Family have Issues with Lodging/Transportation while Pt is in Rehab?: No  Goals/Additional Needs Patient/Family Goal for Rehab: PT/OT supervision goals Expected length of stay: 12-15 days Cultural Considerations: None Dietary Needs: Regular diet, thin liquids Equipment Needs: TBD Pt/Family Agrees to Admission and willing to participate: Yes Program Orientation Provided & Reviewed with Pt/Caregiver Including Roles  & Responsibilities: Yes  Decrease burden of Care through IP rehab admission: N/A  Possible need for SNF  placement upon discharge: Not anticipated  Patient Condition: This patient's medical and functional status has changed since the consult dated: 04/23/18 in which the Rehabilitation Physician determined and documented that the patient's condition is appropriate for intensive rehabilitative care in an inpatient rehabilitation facility. See "History of Present Illness" (above) for medical update. Functional changes  are: Currently requiring min to mod assist for hallway ambulation.  PT continuing to recommend inpatient rehab stay.  Neurosurgery documenting no cervical intervention at this time but will follow for increased symptoms.  Patient's medical and functional status update has been discussed with the Rehabilitation physician and patient remains appropriate for inpatient rehabilitation. Will admit to inpatient rehab today.  Preadmission Screen Completed By:  Retta Diones, 04/26/2018 2:05 PM ______________________________________________________________________   Discussed status with Dr. Naaman Plummer on 04/26/18 at 1405 and received telephone approval for admission today.  Admission Coordinator:  Retta Diones, time 1405/Date 04/26/18           Cosigned by: Meredith Staggers, MD at 04/26/2018 3:13 PM  Revision History

## 2018-04-28 NOTE — Progress Notes (Signed)
Physical Therapy Session Note  Patient Details  Name: Cheryl Pearson MRN: [id number] Date of Birth: 1940-07-13  Today's Date: 04/28/2018 PT Individual Time: 0800-0915; 1400-1500 PT Individual Time Calculation (min): 75 min and 60 min  Short Term Goals: Week 1:  PT Short Term Goal 1 (Week 1): =LTG due to estimated LOS  Skilled Therapeutic Interventions/Progress Updates:    Session 1: Pt received seated in bed, very pleasant and excited to begin therapy today. Pt reports some pain in mid-upper back, not rated. Pt reports she just received pain medication prior to start of therapy session. Also showed pt shoulder rolls for decreasing upper back tightness. Supine to sit with min A with cueing for log roll technique. Min A for dressing while seated EOB. Sit to stand with min A to RW. Standing balance with min A while pulling up pants and underwear. Dependent to don shoes with R AFO. Ambulation to bathroom with min A for balance. Toilet transfer with min A with use of RW and grab bar. Ambulation x 100 ft with RW and R AFO and R toe cap. Pt exhibits narrow BOS, R hip add and ER, knee flexion, and decreased hip flex and RLE clearance with gait. Attempt standing 1" step-taps, pt unable to lift RLE against gravity onto step. Standing mini-squats x 5 reps with focus on full R knee ext. Pt reports she does not like to extend her R knee because it tends to hyperextend during gait, will continue to address. Pt left seated in recliner in room set up for breakfast, needs in reach and husband present.  Session 2: Pt received seated in recliner chair in room, agreeable to PT. No complaints of pain. Sit to stand with min A to RW. SPT recliner to w/c with RW and min A. Dependent transport via w/c to therapy gym for time conservation. Trial ambulation with no AFO and RW, min A for balance. Pt exhibits decreased RLE clearance with gait without use of AFO. Trial ambulation with R GRAFO and no toe cap, min A for gait with RW  but pt does catch toes on the ground with gait. Ambulation with use of toe cap and R GRAFO, min A with RW. Pt exhibits improved gait pattern and RLE clearance with use of both of these. Education with patient and her husband about types of shoes to wear with AFO, skin inspection, etc. Pt does have some redness on her proximal shin from AFO. Education with patient to take AFO off at the end of the day when done with therapies. Pt left seated in recliner in room with needs in reach at end of therapy session, husband present.   Therapy Documentation Precautions:  Precautions Precautions: Back, Fall Required Braces or Orthoses: (no brace) Restrictions Weight Bearing Restrictions: No   Therapy/Group: Individual Therapy  Peter Congo, PT, DPT  04/28/2018, 9:57 AM

## 2018-04-28 NOTE — Progress Notes (Signed)
[place] PHYSICAL MEDICINE & REHABILITATION PROGRESS NOTE   Subjective/Complaints: Had an excellent day with therapy. Very pleased with intensity. Pain better with robaxin on board  ROS: Patient denies fever, rash, sore throat, blurred vision, nausea, vomiting, diarrhea, cough, shortness of breath or chest pain, joint or back pain, headache, or mood change.   Objective:   Vas [place] Lower Extremity Venous (dvt)  Result Date: 04/27/2018  Lower Venous Study Indications: Swelling.  Performing Technologist: Blanch Media RVS  Examination Guidelines: A complete evaluation includes B-mode imaging, spectral Doppler, color Doppler, and power Doppler as needed of all accessible portions of each vessel. Bilateral testing is considered an integral part of a complete examination. Limited examinations for reoccurring indications may be performed as noted.  Right Venous Findings: +---------+---------------+---------+-----------+----------+-------+          CompressibilityPhasicitySpontaneityPropertiesSummary +---------+---------------+---------+-----------+----------+-------+ CFV      Full           Yes      Yes                          +---------+---------------+---------+-----------+----------+-------+ SFJ      Full                                                 +---------+---------------+---------+-----------+----------+-------+ FV Prox  Full                                                 +---------+---------------+---------+-----------+----------+-------+ FV Mid   Full                                                 +---------+---------------+---------+-----------+----------+-------+ FV DistalFull                                                 +---------+---------------+---------+-----------+----------+-------+ PFV      Full                                                 +---------+---------------+---------+-----------+----------+-------+ POP      Full            Yes      Yes                          +---------+---------------+---------+-----------+----------+-------+ PTV      Full                                                 +---------+---------------+---------+-----------+----------+-------+ PERO     Full                                                 +---------+---------------+---------+-----------+----------+-------+  Left Venous Findings: +---------+---------------+---------+-----------+----------+-------+          CompressibilityPhasicitySpontaneityPropertiesSummary +---------+---------------+---------+-----------+----------+-------+ CFV      Full           Yes      Yes                          +---------+---------------+---------+-----------+----------+-------+ SFJ      Full                                                 +---------+---------------+---------+-----------+----------+-------+ FV Prox  Full                                                 +---------+---------------+---------+-----------+----------+-------+ FV Mid   Full                                                 +---------+---------------+---------+-----------+----------+-------+ FV DistalFull                                                 +---------+---------------+---------+-----------+----------+-------+ PFV      Full                                                 +---------+---------------+---------+-----------+----------+-------+ POP      Full           Yes      Yes                          +---------+---------------+---------+-----------+----------+-------+ PTV      Full                                                 +---------+---------------+---------+-----------+----------+-------+ PERO     Full                                                 +---------+---------------+---------+-----------+----------+-------+    Summary: Right: There is no evidence of deep vein thrombosis in the lower extremity.  No cystic structure found in the popliteal fossa. Left: There is no evidence of deep vein thrombosis in the lower extremity. No cystic structure found in the popliteal fossa.  *See table(s) above for measurements and observations. Electronically signed by Lemar Livings MD on 04/27/2018 at 11:16:22 AM.    Final    Recent Labs    04/27/18 0513  WBC 10.9*  HGB 12.3  HCT 36.7  PLT 419*   Recent Labs    04/27/18 0513  NA 133*  K 4.4  CL 99  CO2 25  GLUCOSE 145*  BUN 17  CREATININE 0.61  CALCIUM 8.8*    Intake/Output Summary (Last 24 hours) at 04/28/2018 0855 Last data filed at 04/27/2018 1809 Gross per 24 hour  Intake 360 ml  Output -  Net 360 ml     Physical Exam: Vital Signs Blood pressure (!) 161/77, pulse 78, temperature 98 F (36.7 C), temperature source Oral, resp. rate 16, height 5\' 5"  (1.651 m), weight 72.2 kg, SpO2 98 %. Constitutional: No distress . Vital signs reviewed. HEENT: EOMI, oral membranes moist Neck: supple Cardiovascular: RRR without murmur. No JVD    Respiratory: CTA Bilaterally without wheezes or rales. Normal effort    GI: BS +, non-tender, non-distended  Skin: neck/back incision cdi Musculoskeletal:back wound clean and intact. TTP Neurological: Patient is alert.  UE 5/5. RLE: HF/KE 2/5, APF 2/5, ADF tr/5.---no changes LLE: 3/5 HF, KE and 4/5 ADF/PF. decreased LT/PP RLE and up to umbilicus right more affected than left. DTR's tr LE's     Assessment/Plan: 1. Functional deficits secondary to thoracic myelopathy which require 3+ hours per day of interdisciplinary therapy in a comprehensive inpatient rehab setting.  Physiatrist is providing close team supervision and 24 hour management of active medical problems listed below.  Physiatrist and rehab team continue to assess barriers to discharge/monitor patient progress toward functional and medical goals  Care Tool:  Bathing    Body parts bathed by patient: Right arm, Left arm, Chest, Front perineal  area, Abdomen, Buttocks, Right upper leg, Left upper leg, Face   Body parts bathed by helper: Right lower leg, Left lower leg     Bathing assist Assist Level: Minimal Assistance - Patient > 75%     Upper Body Dressing/Undressing Upper body dressing   What is the patient wearing?: Hospital gown only    Upper body assist Assist Level: Moderate Assistance - Patient 50 - 74%    Lower Body Dressing/Undressing Lower body dressing      What is the patient wearing?: Pants     Lower body assist Assist for lower body dressing: Moderate Assistance - Patient 50 - 74%     Toileting Toileting Toileting Activity did not occur Press photographer(Clothing management and hygiene only): Safety/medical concerns  Toileting assist Assist for toileting: Moderate Assistance - Patient 50 - 74%     Transfers Chair/bed transfer  Transfers assist  Chair/bed transfer activity did not occur: Safety/medical concerns  Chair/bed transfer assist level: Moderate Assistance - Patient 50 - 74%     Locomotion Ambulation   Ambulation assist   Ambulation activity did not occur: Safety/medical concerns  Assist level: Minimal Assistance - Patient > 75% Assistive device: Orthosis(AFO and toe cap ) Max distance: 7340ft    Walk 10 feet activity   Assist     Assist level: Minimal Assistance - Patient > 75% Assistive device: Walker-rolling, Orthosis(AFO and toe cap )   Walk 50 feet activity   Assist    Assist level: Minimal Assistance - Patient > 75%      Walk 150 feet activity   Assist Walk 150 feet activity did not occur: Safety/medical concerns         Walk 10 feet on uneven surface  activity   Assist Walk 10 feet on uneven surfaces activity did not occur: Safety/medical concerns         Wheelchair     Assist Will patient use wheelchair at discharge?: No      Wheelchair assist  level: Supervision/Verbal cueing Max wheelchair distance: 100    Wheelchair 50 feet with 2 turns  activity    Assist        Assist Level: Supervision/Verbal cueing   Wheelchair 150 feet activity     Assist Wheelchair 150 feet activity did not occur: Safety/medical concerns        Medical Problem List and Plan: 1.Decreased functional mobility with right lower extremity weaknesssecondary to cervical and thoracic myelopathy. Status post decompression T1-2. Continue slow taper of Decadron -beginning therapies -likely will need Right AFO 2. DVT Prophylaxis/Anticoagulation: SCDs. Pending vascular study 3. Pain Management:Robaxin and oxycodone as needed  -back pain appears to be predominantly spastic-better when being proactive with robaxin 4. Mood:Provide emotional support 5. Neuropsych: This patientiscapable of making decisions on herown behalf. 6. Skin/Wound Care:Routine skin checks 7. Fluids/Electrolytes/Nutrition:encourage PO   -protein supp for hypoalbuminemia  8. Hypertension. Norvasc 2.5 mg daily. Monitor with increased mobility 9. Hyperlipidemia. Zetia 10. Constipation. Laxative assistance, encourage appropriate PO intake 11. Elevated LFT"s---likely reactive  -re-check tomorrow    LOS: 2 days A FACE TO FACE EVALUATION WAS PERFORMED  Ranelle OysterZachary T Tykee Heideman 04/28/2018, 8:55 AM

## 2018-04-29 ENCOUNTER — Inpatient Hospital Stay (HOSPITAL_COMMUNITY): Payer: Medicare Other | Admitting: Occupational Therapy

## 2018-04-29 ENCOUNTER — Inpatient Hospital Stay (HOSPITAL_COMMUNITY): Payer: Medicare Other | Admitting: Physical Therapy

## 2018-04-29 ENCOUNTER — Inpatient Hospital Stay (HOSPITAL_COMMUNITY): Payer: Medicare Other

## 2018-04-29 LAB — HEPATIC FUNCTION PANEL
ALK PHOS: 68 U/L (ref 38–126)
ALT: 99 U/L — ABNORMAL HIGH (ref 0–44)
AST: 37 U/L (ref 15–41)
Albumin: 3.3 g/dL — ABNORMAL LOW (ref 3.5–5.0)
Bilirubin, Direct: 0.1 mg/dL (ref 0.0–0.2)
Indirect Bilirubin: 0.6 mg/dL (ref 0.3–0.9)
TOTAL PROTEIN: 7.2 g/dL (ref 6.5–8.1)
Total Bilirubin: 0.7 mg/dL (ref 0.3–1.2)

## 2018-04-29 MED ORDER — METHOCARBAMOL 500 MG PO TABS
500.0000 mg | ORAL_TABLET | ORAL | Status: DC | PRN
  Administered 2018-04-29 – 2018-05-06 (×15): 500 mg via ORAL
  Filled 2018-04-29 (×14): qty 1

## 2018-04-29 NOTE — Progress Notes (Signed)
Social Work Assessment and Plan  Patient Details  Name: Cheryl Pearson MRN: [id number] Date of Birth: 02-02-1941  Today's Date: 04/28/2018  Problem List:  Patient Active Problem List   Diagnosis Date Noted  . Essential hypertension   . Cervical spondylosis without myelopathy   . Steroid-induced hyperglycemia   . Postoperative pain   . Hyponatremia   . Leucocytosis   . Thoracic myelopathy 04/22/2018  . ALLERGIC RHINITIS 01/19/2007   Past Medical History:  Past Medical History:  Diagnosis Date  . Anxiety   . Arthritis   . Colitis   . Early age-related macular degeneration   . Hernia   . Hypertension   . Infertility, female   . Menopausal symptoms   . Rosacea   . Thyroid disease 1968   hypothyroidism treated with meds for short time then all labs normal   Past Surgical History:  Past Surgical History:  Procedure Laterality Date  . bone spur Left   . COLONOSCOPY  11/2011   polyp, ulcerative coliits inactive  . COLONOSCOPY  2005  . COLONOSCOPY  1970's   diagnosed with ulcerative colitis  . ESOPHAGOGASTRODUODENOSCOPY (EGD) WITH PROPOFOL N/A 11/20/2014   Procedure: ESOPHAGOGASTRODUODENOSCOPY (EGD) WITH PROPOFOL;  Surgeon: Laurence Spates, MD;  Location: [facility] END[facility]COPY;  Service: Endoscopy;  Laterality: N/A;  . INGUINAL HERNIA REPAIR Right 75 mos old  . LAPAROSCOPIC CHOLECYSTECTOMY  09/1998  . SAVORY DILATION N/A 11/20/2014   Procedure: SAVORY DILATION;  Surgeon: Laurence Spates, MD;  Location: [facility] END[facility]COPY;  Service: Endoscopy;  Laterality: N/A;  . [place] EXTRACTION  1968   Social History:  reports that she has never smoked. She has never used smokeless tobacco. She reports current alcohol use of about 1.0 standard drinks of alcohol per week. She reports that she does not use drugs.  Family / Support Systems Marital Status: Married How Long?: 52 years Patient Roles: Spouse, Parent(dtr in [place]) Spouse/Significant Other: Kateena Degroote - husband - [phone] (h);  [phone] (m) Children: dtr in [place] Anticipated Caregiver: husband Ability/Limitations of Caregiver: Husband is retired and can assist.  He has been staying with patient in the hospital. Caregiver Availability: 24/7 Family Dynamics: close, supportive husband  Social History Preferred language: English Religion: Non-Denominational Education: college/graduate work - education Read: Yes Write: Yes Employment Status: Retired Public relations account executive Issues: none reported Guardian/Conservator: N/A - MD has determined that pt is capable of making her own decisions.   Abuse/Neglect Abuse/Neglect Assessment Can Be Completed: Yes Physical Abuse: Denies Verbal Abuse: Denies Sexual Abuse: Denies Exploitation of patient/patient's resources: Denies Self-Neglect: Denies  Emotional Status Pt's affect, behavior and adjustment status: Pt is motivated to get better and is grateful to have the opportunity to come to CIR. Recent Psychosocial Issues: none reported Psychiatric History: Anxiety listed in medical chart, but pt did not discuss this with CSW. Substance Abuse History: none reported  Patient / Family Perceptions, Expectations & Goals Pt/Family understanding of illness & functional limitations: Pt/husband show a good understanding of pt's condition and limitations. Premorbid pt/family roles/activities: Pt has been an active volunteer for many years. Anticipated changes in roles/activities/participation: Pt would like to resume activities as she is able. Pt/family expectations/goals: Pt wants to be able to care for herself and return home safely.  Community Resources [employer] Scripts: None Premorbid Home Care/DME Agencies: None Transportation available at discharge: husband  Discharge Planning Living Arrangements: Spouse/significant other Support Systems: Spouse/significant other, Children, Friends/neighbors Type of Residence: Private residence Administrator, sports:  Multimedia programmer (specify)([employer]are) Financial Resources: Fish farm manager, Family Support Financial Screen Referred: No Money Management: Patient, Spouse Does the patient have any problems obtaining your medications?: No Home Management: Pt and husband share these responsibilities. Patient/Family Preliminary Plans: Pt plans to return to her home she shares with her husband. Social Work Anticipated Follow Up Needs: HH/OP Expected length of stay: 7 to 10 days  Clinical Impression CSW met with pt and her husband to introduce self and role of CSW, as well as to complete assessment.  Pt is grateful for the opportunity to come to CIR, rather than just going home.  She can't imagine what that would have been like, as she is learning how to best do things with the therapists here and receiving therapy.  CSW explained that we would continue therapy after d/c and husband is willing to take pt for outpt PT/OT.  CSW discussed outpt options, as well as HH options, although they seem to be leaning toward outpt.  Pt has been borrowing DME from a friend, so she will need her own DME.  No current concerns/needs/questions.  CSW will continue to follow and assist as needed.  Korissa Horsford, Silvestre Mesi 04/29/2018, 9:56 AM

## 2018-04-29 NOTE — IPOC Note (Signed)
Overall Plan of Care [facility]) Patient Details Name: Cheryl Pearson MRN: [id number] DOB: 18-May-1940  Admitting Diagnosis: <principal problem not specified>  Hospital Problems: Active Problems:   Thoracic myelopathy     Functional Problem List: Nursing Skin Integrity  PT Balance, Safety, Sensory, Endurance, Motor, Pain  OT Balance, Pain, Cognition, Safety, Endurance, Motor  SLP    TR         Basic ADL's: OT Grooming, Bathing, Dressing, Toileting     Advanced  ADL's: OT       Transfers: PT Bed Mobility, Bed to Chair, Car, Occupational psychologist, Research scientist (life sciences): PT Stairs, Psychologist, prison and probation services, Ambulation     Additional Impairments: OT None  SLP        TR      Anticipated Outcomes Item Anticipated Outcome  Self Feeding no goal set  Swallowing      Basic self-care  (S)  Toileting  (S)   Bathroom Transfers (S)  Bowel/Bladder  mod I   Transfers  Supervision  Locomotion  supervision  Communication     Cognition     Pain  less than 3 out of 10  Safety/Judgment  mod I    Therapy Plan: PT Intensity: Minimum of 1-2 x/day ,45 to 90 minutes PT Frequency: 5 out of 7 days PT Duration Estimated Length of Stay: 7-10 days OT Intensity: Minimum of 1-2 x/day, 45 to 90 minutes OT Frequency: 5 out of 7 days OT Duration/Estimated Length of Stay: 7-10 days      Team Interventions: Nursing Interventions Patient/Family Education, Skin Care/Wound Management, Pain Management  PT interventions Ambulation/gait training, Community reintegration, DME/adaptive equipment instruction, Neuromuscular re-education, Stair training, UE/LE Strength taining/ROM, Psychosocial support, Wheelchair propulsion/positioning, UE/LE Coordination activities, Therapeutic Activities, Skin care/wound management, Pain management, Functional electrical stimulation, Discharge planning, Warden/ranger, Disease management/prevention, Functional mobility training, Cognitive  remediation/compensation, Patient/family education, Splinting/orthotics, Therapeutic Exercise  OT Interventions Warden/ranger, Community reintegration, Disease mangement/prevention, Equities trader education, Self Care/advanced ADL retraining, Therapeutic Exercise, UE/LE Coordination activities, Cognitive remediation/compensation, Wheelchair propulsion/positioning, Discharge planning, DME/adaptive equipment instruction, Functional mobility training, Pain management, Psychosocial support, Therapeutic Activities, Skin care/wound managment, UE/LE Strength taining/ROM  SLP Interventions    TR Interventions    SW/CM Interventions Discharge Planning, Psychosocial Support, Patient/Family Education   Barriers to Discharge MD  Medical stability  Nursing      PT Home environment access/layout    OT      SLP      SW       Team Discharge Planning: Destination: PT-Home ,OT- Home , SLP-  Projected Follow-up: PT-Home health PT, 24 hour supervision/assistance, OT-  Home health OT, SLP-  Projected Equipment Needs: PT- , OT- Tub/shower seat, SLP-  Equipment Details: PT-has RW, OT-  Patient/family involved in discharge planning: PT- Patient, Family member/caregiver,  OT-Patient, Family member/caregiver, SLP-   MD ELOS: 7-10 days Medical Rehab Prognosis:  Excellent Assessment: The patient has been admitted for CIR therapies with the diagnosis of thoracic myelopathy. The team will be addressing functional mobility, strength, stamina, balance, safety, adaptive techniques and equipment, self-care, bowel and bladder mgt, patient and caregiver education, NMR, orthotics, pain mgt. Goals have been set at  Supervision to occasional Mod I with basic mobility and self-care tasks. Likely will need Right AFO.    Ranelle Oyster, MD, FAAPMR      See Team Conference Notes for weekly updates to the plan of care

## 2018-04-29 NOTE — Progress Notes (Signed)
Occupational Therapy Session Note  Patient Details  Name: Meegan F Bhavsar MRN: [id number] Date of Birth: 06/04/1940  Today's Date: 04/29/2018 OT Individual Time: 1000-1100 OWaldo Laine Individual Time Calculation (min): 60 min    Short Term Goals: Week 1:  OT Short Term Goal 1 (Week 1): Pt will sequence through walk in shower transfer with no vc OT Short Term Goal 2 (Week 1): Pt will complete LB dressing with LRAD with CGA OT Short Term Goal 3 (Week 1): Pt will complete functional mobility to toilet with CGA and LRAD  Skilled Therapeutic Interventions/Progress Updates:    1:1. Pt received seated in recliner. Pt stands at sink with Mod A for sit to stand from recliner to brush teeth with CGA for balance. OT reviews AE for doffing/donning socks and shoes and installs shoe buttons. Pt able to don R sock with sock aide however requires A for guiding toes into shoe. Pt able to push foot into shoe with shoe funnell and pull out. Pt able to fasten shoes with VC for pulling on correct portion of lace to hook over shoe button. Discussed home shower set up and potential grab bar placement. Direct handoff to next OT to practice shower stall transfer.  Therapy Documentation Precautions:  Precautions Precautions: Back, Fall Required Braces or Orthoses: (no brace) Restrictions Weight Bearing Restrictions: No General:   Vital Signs: Therapy Vitals Temp: 97.9 F (36.6 C) Temp Source: Oral Pulse Rate: 72 Resp: 16 BP: (!) 153/75 Patient Position (if appropriate): Lying Oxygen Therapy SpO2: 94 % O2 Device: Room Air Pain: Pain Assessment Pain Scale: 0-10 Pain Score: 2  Pain Type: Surgical pain Pain Location: Back Pain Orientation: Upper Pain Descriptors / Indicators: Sharp Pain Frequency: Intermittent Pain Onset: On-going Patients Stated Pain Goal: 2 Pain Intervention(s): Emotional support ADL: ADL Equipment Provided: Reacher, Sock aid Eating: Set up Where Assessed-Eating: Edge of bed Grooming:  Supervision/safety Where Assessed-Grooming: Edge of bed Upper Body Bathing: Supervision/safety Where Assessed-Upper Body Bathing: Shower Lower Body Bathing: Minimal assistance, Minimal cueing Where Assessed-Lower Body Bathing: Shower Upper Body Dressing: Supervision/safety Where Assessed-Upper Body Dressing: Edge of bed Lower Body Dressing: Minimal assistance Where Assessed-Lower Body Dressing: Edge of bed Toileting: Minimal cueing, Minimal assistance Where Assessed-Toileting: Bedside Commode Toilet Transfer: Minimal assistance, Minimal verbal cueing Toilet Transfer Method: ProofreaderAmbulating Toilet Transfer Equipment: Bedside commode, AcupuncturistGrab bars Walk-In Shower Transfer: Minimal cueing, Minimal assistance Film/video editorWalk-In Shower Transfer Method: Designer, industrial/productAmbulating Walk-In Shower Equipment: Manufacturing systems engineerTransfer tub bench Vision   Perception    Praxis   Exercises:   Other Treatments:     Therapy/Group: Individual Therapy  Shon HaleStephanie M Brieonna Crutcher 04/29/2018, 9:58 AM

## 2018-04-29 NOTE — Plan of Care (Signed)
  Problem: Consults Goal: RH SPINAL CORD INJURY PATIENT EDUCATION Description  See Patient Education module for education specifics.  Outcome: Progressing   Problem: RH SAFETY Goal: RH STG ADHERE TO SAFETY PRECAUTIONS W/ASSISTANCE/DEVICE Description STG Adhere to Safety Precautions With mod I Assistance/Device.  Outcome: Progressing   Problem: RH PAIN MANAGEMENT Goal: RH STG PAIN MANAGED AT OR BELOW PT'S PAIN GOAL Description Less than 3 out of 10  Outcome: Progressing   Problem: RH KNOWLEDGE DEFICIT SCI Goal: RH STG INCREASE KNOWLEDGE OF SELF CARE AFTER SCI Outcome: Progressing   

## 2018-04-29 NOTE — Progress Notes (Signed)
Occupational Therapy Session Note  Patient Details  Name: Cheryl Pearson MRN: [id number] Date of Birth: 1940/08/27  Today's Date: 04/29/2018 OT Individual Time: 1000-1100 and 1333-1450 OT Individual Time Calculation (min): 60 min and 77 min  Short Term Goals: Week 1:  OT Short Term Goal 1 (Week 1): Pt will sequence through walk in shower transfer with no vc OT Short Term Goal 2 (Week 1): Pt will complete LB dressing with LRAD with CGA OT Short Term Goal 3 (Week 1): Pt will complete functional mobility to toilet with CGA and LRAD  Skilled Therapeutic Interventions/Progress Updates:    Pt greeted via OT handoff with spouse Raiford Noble present. They both had multiple d/c questions about bathroom setup and environmental modifications to make. Started by brainstorming walk-in shower setup. Discussed transfer technique with pt and spouse, and also provided demonstration using shower square with 3 inch ledge. Pt completed simulated shower stall transfers using RW at ambulatory level (with Rt AFO and toe cap) with steady assist and Min A for elevating R LE over ledge when stepping back into shower. Recommended they contact local hardware store (like [employer]) for grab bar installation vs using suction grab bars. Also advised having nonslip surface at bottom of shower to maximize safety. Spouse with hands on transfer practice during session with OT also providing Min A for safety. He would benefit from additional education and practice. Discussed always wearing AFO during shower transfers, and to resume dressing sit<stand from elevated toilet or supportive chair in bathroom. Also discussed with pt/spouse environmental modifications to implement in home to increase safety (I.e. minimizing clutter and having multiple seats throughout home for seated rest during BADL/IADL engagement.) Started education regarding walker bag for ease of item transport. She would benefit from having one at [facility] when they are in stock. At end  of session pt was left with spouse and all needs.   2nd Session 1:1 tx (77 min) Pt greeted in recliner with spouse present, requesting to use the restroom. She completed ambulatory transfer with RW to elevated toilet with steady assist. Able to complete clothing mgt and hygiene with steady assist post B+B void. After handwashing at sink, pt transferred to w/c. Once she was escorted to artrium, worked on meaningful IADL participation/education in community setting. Pt first self propelled w/c in gift shop for UB strengthening, as she loves to shop. Min vcs required for navigating in tight spaces to avoid obstacles. Next had pt complete ambulatory transfers to booth and chairs in food court. She used RW with Min A at ambulatory level. Able to scoot chair towards and away from table. Also able to scoot into and out of booth with vcs for Rt foot mgt. Pts spouse present to observe, asking appropriate questions about technique and safety. They go out to eat frequently, and verbalized that this aspect of session was helpful in prep for community mobility post d/c. Pt and spouse with additional questions regarding ergonomic positioning while using electronics or playing card games (to minimize back pain/strain). Also discussed energy conservation strategies to implement in the community (I.e. spouse bringing along collapsible portable chair) to maximize IADL participation. At end of session pt was escorted back to room via w/c. She completed short distance ambulation back to recliner using device with steady assist. Pt left with spouse and all needs at session exit.      Therapy Documentation Precautions:  Precautions Precautions: Back, Fall Required Braces or Orthoses: (no brace) Restrictions Weight Bearing Restrictions: No  Pain: Pt reported pain to be manageable during both sessions with rest breaks provided. Discussed with RN order of k-pad for shoulder pain relief at night    ADL: ADL Equipment  Provided: Reacher, Sock aid Eating: Set up Where Assessed-Eating: Edge of bed Grooming: Supervision/safety Where Assessed-Grooming: Edge of bed Upper Body Bathing: Supervision/safety Where Assessed-Upper Body Bathing: Shower Lower Body Bathing: Minimal assistance, Minimal cueing Where Assessed-Lower Body Bathing: Shower Upper Body Dressing: Supervision/safety Where Assessed-Upper Body Dressing: Edge of bed Lower Body Dressing: Minimal assistance Where Assessed-Lower Body Dressing: Edge of bed Toileting: Minimal cueing, Minimal assistance Where Assessed-Toileting: Bedside Commode Toilet Transfer: Minimal assistance, Minimal verbal cueing Toilet Transfer Method: ProofreaderAmbulating Toilet Transfer Equipment: Bedside commode, AcupuncturistGrab bars Walk-In Shower Transfer: Minimal cueing, Minimal assistance Film/video editorWalk-In Shower Transfer Method: Designer, industrial/productAmbulating Walk-In Shower Equipment: Emergency planning/management officerTransfer tub bench      Therapy/Group: Individual Therapy  Rocio Roam A Venessa Wickham 04/29/2018, 12:26 PM

## 2018-04-29 NOTE — Plan of Care (Signed)
Pt is alert and oriented, able to communicate needs. C/O pain earlier on this shift, pain med was given with some relief. Honey comb dressing in place. Family member at bedside. Nothing acute noted on this shift. Will continue monitoring.

## 2018-04-29 NOTE — Progress Notes (Signed)
Physical Therapy Session Note  Patient Details  Name: Cheryl Pearson MRN: [id number] Date of Birth: [birth year]  Today's Date: 04/29/2018 PT Individual Time: 0800-0900 PT Individual Time Calculation (min): 60 min   Short Term Goals: Week 1:  PT Short Term Goal 1 (Week 1): =LTG due to estimated LOS  Skilled Therapeutic Interventions/Progress Updates:    Pt received seated in recliner in room, agreeable to PT. No complaints of pain. Pt transfers with CGA to RW throughout therapy session. Dependent to don shoes and R AFO. Toilet transfer with CGA, pt is Supervision for 3/3 toileting steps. Manual w/c propulsion x 150 ft with BUE for global endurance training. SPT w/c to mat table with RW and min A for balance. Sit to supine mod A for BLE management. Supine BLE therex x 10 reps, AAROM on RLE and AROM on LLE: heel slides, hip abd, quad sets (R) and SLR (L), SKFO. Supine to sit with min A for RLE management. Ambulation x 150 ft with RW and min A for balance with R AFO, focus on increasing R hip flexion with gait to improve RLE clearance. With onset of fatigue pt has increase in R toe drag. Pt left seated in recliner in room with needs in reach, husband present.  Therapy Documentation Precautions:  Precautions Precautions: Back, Fall Required Braces or Orthoses: (no brace) Restrictions Weight Bearing Restrictions: No   Therapy/Group: Individual Therapy  Peter Congo, PT, DPT  04/29/2018, 10:49 AM

## 2018-04-29 NOTE — Progress Notes (Signed)
Cheryl Pearson PHYSICAL MEDICINE & REHABILITATION PROGRESS NOTE   Subjective/Complaints: No new complaints. Robaxin helps her back the most but she would like to have it occasionally sooner than 6 hours. She would prefer to use this over narcotics  ROS: Patient denies fever, rash, sore throat, blurred vision, nausea, vomiting, diarrhea, cough, shortness of breath or chest pain, headache, or mood change.    Objective:   Vas [place] Lower Extremity Venous (dvt)  Result Date: 04/27/2018  Lower Venous Study Indications: Swelling.  Performing Technologist: Blanch Media RVS  Examination Guidelines: A complete evaluation includes B-mode imaging, spectral Doppler, color Doppler, and power Doppler as needed of all accessible portions of each vessel. Bilateral testing is considered an integral part of a complete examination. Limited examinations for reoccurring indications may be performed as noted.  Right Venous Findings: +---------+---------------+---------+-----------+----------+-------+          CompressibilityPhasicitySpontaneityPropertiesSummary +---------+---------------+---------+-----------+----------+-------+ CFV      Full           Yes      Yes                          +---------+---------------+---------+-----------+----------+-------+ SFJ      Full                                                 +---------+---------------+---------+-----------+----------+-------+ FV Prox  Full                                                 +---------+---------------+---------+-----------+----------+-------+ FV Mid   Full                                                 +---------+---------------+---------+-----------+----------+-------+ FV DistalFull                                                 +---------+---------------+---------+-----------+----------+-------+ PFV      Full                                                  +---------+---------------+---------+-----------+----------+-------+ POP      Full           Yes      Yes                          +---------+---------------+---------+-----------+----------+-------+ PTV      Full                                                 +---------+---------------+---------+-----------+----------+-------+ PERO     Full                                                 +---------+---------------+---------+-----------+----------+-------+  Left Venous Findings: +---------+---------------+---------+-----------+----------+-------+          CompressibilityPhasicitySpontaneityPropertiesSummary +---------+---------------+---------+-----------+----------+-------+ CFV      Full           Yes      Yes                          +---------+---------------+---------+-----------+----------+-------+ SFJ      Full                                                 +---------+---------------+---------+-----------+----------+-------+ FV Prox  Full                                                 +---------+---------------+---------+-----------+----------+-------+ FV Mid   Full                                                 +---------+---------------+---------+-----------+----------+-------+ FV DistalFull                                                 +---------+---------------+---------+-----------+----------+-------+ PFV      Full                                                 +---------+---------------+---------+-----------+----------+-------+ POP      Full           Yes      Yes                          +---------+---------------+---------+-----------+----------+-------+ PTV      Full                                                 +---------+---------------+---------+-----------+----------+-------+ PERO     Full                                                  +---------+---------------+---------+-----------+----------+-------+    Summary: Right: There is no evidence of deep vein thrombosis in the lower extremity. No cystic structure found in the popliteal fossa. Left: There is no evidence of deep vein thrombosis in the lower extremity. No cystic structure found in the popliteal fossa.  *See table(s) above for measurements and observations. Electronically signed by Lemar LivingsBrandon Cain MD on 04/27/2018 at 11:16:22 AM.    Final    Recent Labs    04/27/18 0513  WBC 10.9*  HGB 12.3  HCT 36.7  PLT 419*   Recent Labs    04/27/18 0513  NA 133*  K 4.4  CL 99  CO2 25  GLUCOSE 145*  BUN 17  CREATININE 0.61  CALCIUM 8.8*    Intake/Output Summary (Last 24 hours) at 04/29/2018 1008 Last data filed at 04/29/2018 0800 Gross per 24 hour  Intake 720 ml  Output -  Net 720 ml     Physical Exam: Vital Signs Blood pressure (!) 153/75, pulse 72, temperature 97.9 F (36.6 C), temperature source Oral, resp. rate 16, height 5\' 5"  (1.651 m), weight 72.2 kg, SpO2 94 %. Constitutional: No distress . Vital signs reviewed. HEENT: EOMI, oral membranes moist Neck: supple Cardiovascular: RRR without murmur. No JVD    Respiratory: CTA Bilaterally without wheezes or rales. Normal effort    GI: BS +, non-tender, non-distended  Skin: neck/back incision cdi Musculoskeletal:back wound clean and intact. TTP Neurological: Patient is alert.  UE 5/5. RLE: HF/KE 2/5, APF 2/5, ADF tr/5---no real changes today.  LLE: 3/5 HF, KE and 4/5 ADF/PF. decreased LT/PP RLE and up to umbilicus right more affected than left. DTR's tr LE's     Assessment/Plan: 1. Functional deficits secondary to thoracic myelopathy which require 3+ hours per day of interdisciplinary therapy in a comprehensive inpatient rehab setting.  Physiatrist is providing close team supervision and 24 hour management of active medical problems listed below.  Physiatrist and rehab team continue to assess barriers to  discharge/monitor patient progress toward functional and medical goals  Care Tool:  Bathing    Body parts bathed by patient: Right arm, Left arm, Chest, Front perineal area, Abdomen, Buttocks, Right upper leg, Left upper leg, Face   Body parts bathed by helper: Right lower leg, Left lower leg     Bathing assist Assist Level: Minimal Assistance - Patient > 75%     Upper Body Dressing/Undressing Upper body dressing   What is the patient wearing?: Hospital gown only    Upper body assist Assist Level: Moderate Assistance - Patient 50 - 74%    Lower Body Dressing/Undressing Lower body dressing      What is the patient wearing?: Pants     Lower body assist Assist for lower body dressing: Moderate Assistance - Patient 50 - 74%     Toileting Toileting Toileting Activity did not occur Press photographer and hygiene only): Safety/medical concerns  Toileting assist Assist for toileting: Minimal Assistance - Patient > 75%     Transfers Chair/bed transfer  Transfers assist  Chair/bed transfer activity did not occur: Safety/medical concerns  Chair/bed transfer assist level: Minimal Assistance - Patient > 75%     Locomotion Ambulation   Ambulation assist   Ambulation activity did not occur: Safety/medical concerns  Assist level: Minimal Assistance - Patient > 75% Assistive device: Walker-rolling Max distance: 150   Walk 10 feet activity   Assist     Assist level: Minimal Assistance - Patient > 75% Assistive device: Walker-rolling   Walk 50 feet activity   Assist    Assist level: Minimal Assistance - Patient > 75% Assistive device: Walker-rolling    Walk 150 feet activity   Assist Walk 150 feet activity did not occur: Safety/medical concerns  Assist level: Minimal Assistance - Patient > 75% Assistive device: Walker-rolling    Walk 10 feet on uneven surface  activity   Assist Walk 10 feet on uneven surfaces activity did not occur:  Safety/medical concerns         Wheelchair     Assist Will patient use wheelchair at discharge?: No      Wheelchair assist  level: Supervision/Verbal cueing Max wheelchair distance: 150'    Wheelchair 50 feet with 2 turns activity    Assist        Assist Level: Supervision/Verbal cueing   Wheelchair 150 feet activity     Assist Wheelchair 150 feet activity did not occur: Safety/medical concerns   Assist Level: Supervision/Verbal cueing    Medical Problem List and Plan: 1.Decreased functional mobility with right lower extremity weaknesssecondary to cervical and thoracic myelopathy. Status post decompression T1-2. Continue slow taper of Decadron -beginning therapies -likely will need custom Right AFO, has benefited with trials of AFO in therapy 2. DVT Prophylaxis/Anticoagulation: SCDs. Pending vascular study 3. Pain Management:Robaxin and oxycodone as needed  -back pain appears to be predominantly spastic-better when being proactive with robaxin   -changed frequency to q4 prn 4. Mood:Provide emotional support 5. Neuropsych: This patientiscapable of making decisions on herown behalf. 6. Skin/Wound Care:Routine skin checks 7. Fluids/Electrolytes/Nutrition:encourage PO   -protein supp for hypoalbuminemia  8. Hypertension. Norvasc 2.5 mg daily. Monitor with increased mobility 9. Hyperlipidemia. Zetia 10. Constipation. Laxative assistance, encourage appropriate PO intake 11. Elevated LFT"s---likely reactive  -LFT's improving. Follow up next week    LOS: 3 days A FACE TO FACE EVALUATION WAS PERFORMED  Ranelle Oyster 04/29/2018, 10:08 AM

## 2018-04-30 ENCOUNTER — Inpatient Hospital Stay (HOSPITAL_COMMUNITY): Payer: Medicare Other | Admitting: Physical Therapy

## 2018-04-30 ENCOUNTER — Inpatient Hospital Stay (HOSPITAL_COMMUNITY): Payer: Medicare Other | Admitting: Occupational Therapy

## 2018-04-30 DIAGNOSIS — G8220 Paraplegia, unspecified: Secondary | ICD-10-CM

## 2018-04-30 NOTE — Progress Notes (Signed)
Physical Therapy Session Note  Patient Details  Name: Cheryl Pearson MRN: [id number] Date of Birth: 12-16-1940  Today's Date: 04/30/2018 PT Individual Time: 1010-1105 PT Individual Time Calculation (min): 55 min   Short Term Goals: Week 1:  PT Short Term Goal 1 (Week 1): =LTG due to estimated LOS  Skilled Therapeutic Interventions/Progress Updates:   Pt received sitting in recliner and agreeable to PT, Pt noted to have RAFO donned. Stand pivot transfer to [facility] with CGA and min cues of gait pattern in turn.   WC Mobility 2x 261f with supervision assist from PT and min cues for turning technique and improve use of momentum to reduce overall energy expenditure   Gait training with RW x 1543fwith CGA progressing to min assist. Min cues for decreased step length on the LLE to allow improved positioning of the RLE and reduce toe drag.   Dynamic gait training with RAFO and RW to weave through 6 cones x 2. Min assist progressing to CGA on first bout and CGA progressing to close supervision assist on the second. Pt also instructed in stepping over 4 1" canes x 2 bouts. Min assist progressing to CGA to step over obstacles. Min-mod cues for gait pattern and AD management with all dynamic gait training.   Throughout treatment pt performed sit<>stand x 10 with CGA progressing to supervision assist with min cues for proper LE placement. Patient returned to room and performed ambulatory transfer to recliner with Supervision assist and RW. Pt left sitting in recliner with call bell in reach and all needs met.             Therapy Documentation Precautions:  Precautions Precautions: Back, Fall Required Braces or Orthoses: (no brace) Restrictions Weight Bearing Restrictions: No Pain: Pain Assessment Pain Scale: 0-10 Pain Score: 6  Pain Type: Surgical pain Pain Location: Back Pain Orientation: Upper Pain Descriptors / Indicators: Aching Pain Onset: On-going Patients Stated Pain Goal: 3 Pain  Intervention(s): Repositioned;Ambulation/increased activity   Therapy/Group: Individual Therapy  AuLorie Phenix/07/2018, 11:11 AM

## 2018-04-30 NOTE — Progress Notes (Signed)
Occupational Therapy Session Note  Patient Details  Name: Cheryl Pearson MRN: [id number] Date of Birth: 1940-09-14  Today's Date: 04/30/2018 OT Individual Time: 9528-4132 and 1400-1500 OT Individual Time Calculation (min): 75 min and 60 min    Short Term Goals: Week 1:  OT Short Term Goal 1 (Week 1): Pt will sequence through walk in shower transfer with no vc OT Short Term Goal 2 (Week 1): Pt will complete LB dressing with LRAD with CGA OT Short Term Goal 3 (Week 1): Pt will complete functional mobility to toilet with CGA and LRAD  Skilled Therapeutic Interventions/Progress Updates:    Session 1:  Upon entering the room, pt seated in recliner chair with no c/o pain. Pt agreeable to OT intervention and husband present. They had many questions regarding home set up and described home as "cluttered". OT discussed common fall risks at home and they did verbalize that pt would be able to move around safely in home with RW. OT recommended BSC for home use at night for safety with pt's urgency and they were agreeable. OT also recommended shower seat and discussed set up along with purchase of safety treads to decrease fall risk in shower. OT demonstrated and provided verbal cuing for transfer to bathroom with husband returning demonstration safely with pt. OT checked caregiver off to assist pt to bathroom with use of RW and AFO with shoes must be on for safety. RN notified. Pt remained seated in wheelchair with call bell and all needed items within reach. Plan for shower in afternoon session.   Session 2: Upon entering the room, pt seated in wheelchair with husband present in room for continued observation and education. Pt ambulating to bathroom with min guard assist and use of RW. Pt seated on TTB to doff clothing with assistance from husband as needed. Pt bathing with use of LH sponge implemented to increase independence. Pt needing cuing secondary to attempting to twist to wash buttocks and husband  assisted. Pt remained seated on TTB for dressing tasks. OT educated pt and caregiver on use of reacher to thread clothing onto B feet with increased time and mod cuing for technique. OT educated caregiver on how to to assist with donning and doffing shoe with AFO. Pt ambulating to sit in recliner chair at end of session with call bell and all needs within reach. Alarm bell secured.  Therapy Documentation Precautions:  Precautions Precautions: Back, Fall Required Braces or Orthoses: (no brace) Restrictions Weight Bearing Restrictions: No Pain: Pain Assessment Pain Scale: 0-10 Pain Score: 6  Pain Type: Acute pain Pain Location: Back Pain Orientation: Upper Pain Descriptors / Indicators: Aching Pain Frequency: Intermittent Pain Onset: On-going Pain Intervention(s): Medication (See eMAR) ADL: ADL Equipment Provided: Reacher, Sock aid Eating: Set up Where Assessed-Eating: Edge of bed Grooming: Supervision/safety Where Assessed-Grooming: Edge of bed Upper Body Bathing: Supervision/safety Where Assessed-Upper Body Bathing: Shower Lower Body Bathing: Minimal assistance, Minimal cueing Where Assessed-Lower Body Bathing: Shower Upper Body Dressing: Supervision/safety Where Assessed-Upper Body Dressing: Edge of bed Lower Body Dressing: Minimal assistance Where Assessed-Lower Body Dressing: Edge of bed Toileting: Minimal cueing, Minimal assistance Where Assessed-Toileting: Bedside Commode Toilet Transfer: Minimal assistance, Minimal verbal cueing Toilet Transfer Method: Proofreader: Bedside commode, Acupuncturist: Minimal cueing, Minimal assistance Film/video editor Method: Designer, industrial/product: Transfer tub bench   Therapy/Group: Individual Therapy  Alen Bleacher 04/30/2018, 10:11 AM

## 2018-04-30 NOTE — Progress Notes (Signed)
[facility] PHYSICAL MEDICINE & REHABILITATION PROGRESS NOTE   Subjective/Complaints: Continues with pain in the upper back area, good control with oral medications  ROS: Patient denies pain shortness of breath nausea vomiting diarrhea or constipation.    Objective:   No results found. No results for input(s): WBC, HGB, HCT, PLT in the last 72 hours. No results for input(s): NA, K, CL, CO2, GLUCOSE, BUN, CREATININE, CALCIUM in the last 72 hours.  Intake/Output Summary (Last 24 hours) at 04/30/2018 1151 Last data filed at 04/29/2018 2127 Gross per 24 hour  Intake 600 ml  Output -  Net 600 ml     Physical Exam: Vital Signs Blood pressure (!) 160/76, pulse 73, temperature 97.8 F (36.6 C), temperature source Oral, resp. rate 19, height 5\' 5"  (1.651 m), weight 72.2 kg, SpO2 96 %. Constitutional: No distress . Vital signs reviewed. HEENT: EOMI, oral membranes moist Neck: supple Cardiovascular: RRR without murmur. No JVD    Respiratory: CTA Bilaterally without wheezes or rales. Normal effort    GI: BS +, non-tender, non-distended  Skin: neck/back incision cdi Musculoskeletal:back wound clean and intact. TTP Neurological: Patient is alert.  UE 5/5. RLE: HF/KE 2/5, APF 2/5, ADF tr/5---no real changes today.  LLE: 3/5 HF, KE and 4/5 ADF/PF. decreased LT/PP RLE and up to umbilicus right more affected than left. DTR's tr LE's     Assessment/Plan: 1. Functional deficits secondary to thoracic myelopathy which require 3+ hours per day of interdisciplinary therapy in a comprehensive inpatient rehab setting.  Physiatrist is providing close team supervision and 24 hour management of active medical problems listed below.  Physiatrist and rehab team continue to assess barriers to discharge/monitor patient progress toward functional and medical goals  Care Tool:  Bathing    Body parts bathed by patient: Right arm, Left arm, Chest, Front perineal area, Abdomen, Buttocks, Right upper leg,  Left upper leg, Face   Body parts bathed by helper: Right lower leg, Left lower leg     Bathing assist Assist Level: Minimal Assistance - Patient > 75%     Upper Body Dressing/Undressing Upper body dressing   What is the patient wearing?: Hospital gown only    Upper body assist Assist Level: Moderate Assistance - Patient 50 - 74%    Lower Body Dressing/Undressing Lower body dressing      What is the patient wearing?: Pants     Lower body assist Assist for lower body dressing: Moderate Assistance - Patient 50 - 74%     Toileting Toileting Toileting Activity did not occur Press photographer(Clothing management and hygiene only): Safety/medical concerns  Toileting assist Assist for toileting: Contact Guard/Touching assist     Transfers Chair/bed transfer  Transfers assist  Chair/bed transfer activity did not occur: Safety/medical concerns  Chair/bed transfer assist level: Minimal Assistance - Patient > 75%     Locomotion Ambulation   Ambulation assist   Ambulation activity did not occur: Safety/medical concerns  Assist level: Minimal Assistance - Patient > 75% Assistive device: Walker-rolling Max distance: 15'   Walk 10 feet activity   Assist     Assist level: Minimal Assistance - Patient > 75% Assistive device: Walker-rolling   Walk 50 feet activity   Assist    Assist level: Minimal Assistance - Patient > 75% Assistive device: Walker-rolling    Walk 150 feet activity   Assist Walk 150 feet activity did not occur: Safety/medical concerns  Assist level: Minimal Assistance - Patient > 75% Assistive device: Walker-rolling    Walk  10 feet on uneven surface  activity   Assist Walk 10 feet on uneven surfaces activity did not occur: Safety/medical concerns         Wheelchair     Assist Will patient use wheelchair at discharge?: No      Wheelchair assist level: Supervision/Verbal cueing Max wheelchair distance: 150'    Wheelchair 50 feet with 2  turns activity    Assist        Assist Level: Supervision/Verbal cueing   Wheelchair 150 feet activity     Assist Wheelchair 150 feet activity did not occur: Safety/medical concerns   Assist Level: Supervision/Verbal cueing    Medical Problem List and Plan: 1.Decreased functional mobility with right lower extremity weaknesssecondary to cervical and thoracic myelopathy. Status post decompression T1-2. Continue slow taper of Decadron CIR PT OT -likely will need custom Right AFO, has benefited with trials of AFO in therapy 2. DVT Prophylaxis/Anticoagulation: SCDs.  Lower extremity venous Dopplers negative for DVT 3. Pain Management:Robaxin and oxycodone as needed  -back pain appears to be predominantly spastic-better when being proactive with robaxin   -changed frequency to q4 prn 4. Mood:Provide emotional support 5. Neuropsych: This patientiscapable of making decisions on herown behalf. 6. Skin/Wound Care:Routine skin checks 7. Fluids/Electrolytes/Nutrition:encourage PO   -protein supp for hypoalbuminemia  8. Hypertension. Norvasc 2.5 mg daily. Monitor with increased mobility Vitals:   04/29/18 2155 04/30/18 0419  BP: 136/63 (!) 160/76  Pulse: 67 73  Resp: 14 19  Temp: 98.7 F (37.1 C) 97.8 F (36.6 C)  SpO2: 97% 96%   elevated this morning, controlled yesterday evening continue to monitor 9. Hyperlipidemia. Zetia 10. Constipation. Laxative assistance, encourage appropriate PO intake 11. Elevated LFT"s---likely reactive  -LFT's improving. Follow up next week    LOS: 4 days A FACE TO FACE EVALUATION WAS PERFORMED  Erick Colacendrew E Kirsteins 04/30/2018, 11:51 AM

## 2018-05-01 ENCOUNTER — Inpatient Hospital Stay (HOSPITAL_COMMUNITY): Payer: Medicare Other | Admitting: Occupational Therapy

## 2018-05-01 NOTE — Progress Notes (Signed)
PHYSICAL MEDICINE & REHABILITATION PROGRESS NOTE   Subjective/Complaints: No pain complaints, starting to move right lower extremity a little better  ROS: Patient denies pain shortness of breath nausea vomiting diarrhea or constipation.    Objective:   No results found. No results for input(s): WBC, HGB, HCT, PLT in the last 72 hours. No results for input(s): NA, K, CL, CO2, GLUCOSE, BUN, CREATININE, CALCIUM in the last 72 hours.  Intake/Output Summary (Last 24 hours) at 05/01/2018 1104 Last data filed at 05/01/2018 0900 Gross per 24 hour  Intake 460 ml  Output -  Net 460 ml     Physical Exam: Vital Signs Blood pressure (!) 149/62, pulse 70, temperature 99.9 F (37.7 C), temperature source Oral, resp. rate 18, height 5\' 5"  (1.651 m), weight 72.2 kg, SpO2 97 %. Constitutional: No distress . Vital signs reviewed. HEENT: EOMI, oral membranes moist Neck: supple Cardiovascular: RRR without murmur. No JVD    Respiratory: CTA Bilaterally without wheezes or rales. Normal effort    GI: BS +, non-tender, non-distended  Skin: neck/back incision cdi Musculoskeletal:back wound clean and intact. TTP Neurological: Patient is alert.  UE 5/5. RLE: HF/KE 2/5, APF 2/5, ADF tr/5---no real changes today.  LLE: 3/5 HF, KE and 4/5 ADF/PF. decreased LT/PP RLE and up to umbilicus right more affected than left. DTR's tr LE's     Assessment/Plan: 1. Functional deficits secondary to thoracic myelopathy which require 3+ hours per day of interdisciplinary therapy in a comprehensive inpatient rehab setting.  Physiatrist is providing close team supervision and 24 hour management of active medical problems listed below.  Physiatrist and rehab team continue to assess barriers to discharge/monitor patient progress toward functional and medical goals  Care Tool:  Bathing    Body parts bathed by patient: Right arm, Left arm, Chest, Front perineal area, Abdomen, Buttocks, Right upper leg, Left  upper leg, Face, Right lower leg, Left lower leg   Body parts bathed by helper: Buttocks     Bathing assist Assist Level: Minimal Assistance - Patient > 75%     Upper Body Dressing/Undressing Upper body dressing   What is the patient wearing?: Bra, Pull over shirt    Upper body assist Assist Level: Set up assist    Lower Body Dressing/Undressing Lower body dressing      What is the patient wearing?: Pants, Underwear/pull up     Lower body assist Assist for lower body dressing: Minimal Assistance - Patient > 75%     Toileting Toileting Toileting Activity did not occur Press photographer and hygiene only): Safety/medical concerns  Toileting assist Assist for toileting: Minimal Assistance - Patient > 75%     Transfers Chair/bed transfer  Transfers assist  Chair/bed transfer activity did not occur: Safety/medical concerns  Chair/bed transfer assist level: Contact Guard/Touching assist     Locomotion Ambulation   Ambulation assist   Ambulation activity did not occur: Safety/medical concerns  Assist level: Minimal Assistance - Patient > 75% Assistive device: Walker-rolling Max distance: 161ft   Walk 10 feet activity   Assist     Assist level: Minimal Assistance - Patient > 75% Assistive device: Walker-rolling, Orthosis   Walk 50 feet activity   Assist    Assist level: Minimal Assistance - Patient > 75% Assistive device: Orthosis, Walker-rolling    Walk 150 feet activity   Assist Walk 150 feet activity did not occur: Safety/medical concerns  Assist level: Minimal Assistance - Patient > 75% Assistive device: Walker-platform, Orthosis  Walk 10 feet on uneven surface  activity   Assist Walk 10 feet on uneven surfaces activity did not occur: Safety/medical concerns         Wheelchair     Assist Will patient use wheelchair at discharge?: No Type of Wheelchair: Manual    Wheelchair assist level: Supervision/Verbal cueing Max  wheelchair distance: 149ft    Wheelchair 50 feet with 2 turns activity    Assist        Assist Level: Supervision/Verbal cueing   Wheelchair 150 feet activity     Assist Wheelchair 150 feet activity did not occur: Safety/medical concerns   Assist Level: Supervision/Verbal cueing    Medical Problem List and Plan: 1.Decreased functional mobility with right lower extremity weaknesssecondary to cervical and thoracic myelopathy. Status post decompression T1-2. Continue slow taper of Decadron CIR PT OT -likely will need custom Right AFO, has benefited with trials of AFO in therapy 2. DVT Prophylaxis/Anticoagulation: SCDs.  Lower extremity venous Dopplers negative for DVT 3. Pain Management:Robaxin and oxycodone as needed  -back pain appears to be predominantly spastic-better when being proactive with robaxin   -changed frequency to q4 prn 4. Mood:Provide emotional support 5. Neuropsych: This patientiscapable of making decisions on herown behalf. 6. Skin/Wound Care:Routine skin checks 7. Fluids/Electrolytes/Nutrition:encourage PO   -protein supp for hypoalbuminemia  8. Hypertension. Norvasc 2.5 mg daily. Monitor with increased mobility Vitals:   04/30/18 1958 05/01/18 0604  BP: (!) 138/59 (!) 149/62  Pulse: 80 70  Resp: 18 18  Temp: 97.8 F (36.6 C) 99.9 F (37.7 C)  SpO2: 98% 97%   elevated this morning, controlled yesterday evening continue to monitor 9. Hyperlipidemia. Zetia 10. Constipation. Laxative assistance, encourage appropriate PO intake 11. Elevated LFT"s---likely reactive  -LFT's improving. Follow up next week 12.  Low-grade temp x1, 99.9 we will continue to monitor if persists will get UA C&S  LOS: 5 days A FACE TO FACE EVALUATION WAS PERFORMED  Erick Colace 05/01/2018, 11:04 AM

## 2018-05-01 NOTE — Progress Notes (Signed)
Occupational Therapy Session Note  Patient Details  Name: Cheryl Pearson MRN: [id number] Date of Birth: 09/04/1940  Today's Date: 05/01/2018 OT Group Time: 1100-1200 OT Group Time Calculation (min): 60 min   Skilled Therapeutic Interventions/Progress Updates:    Pt engaged in therapeutic w/c level dance group focusing on patient choice, UE/LE strengthening, salience, activity tolerance, and social participation. Pt was guided through various dance-based exercises involving UEs/LEs and trunk. All music was selected by group members. Emphasis placed on general strengthening, endurance, and R LE NMR. Pt and spouse attended together, very involved, smiling, and requesting music genres. Pt utilizing active assist techniques for incorporating R LE. Min vcs for back precaution adherence. At end of session spouse returned pt to room via w/c.    Therapy Documentation Precautions:  Precautions Precautions: Back, Fall Required Braces or Orthoses: (no brace) Restrictions Weight Bearing Restrictions: No Pain: No s/s pain during session    ADL: ADL Equipment Provided: Reacher, Sock aid Eating: Set up Where Assessed-Eating: Edge of bed Grooming: Supervision/safety Where Assessed-Grooming: Edge of bed Upper Body Bathing: Supervision/safety Where Assessed-Upper Body Bathing: Shower Lower Body Bathing: Minimal assistance, Minimal cueing Where Assessed-Lower Body Bathing: Shower Upper Body Dressing: Supervision/safety Where Assessed-Upper Body Dressing: Edge of bed Lower Body Dressing: Minimal assistance Where Assessed-Lower Body Dressing: Edge of bed Toileting: Minimal cueing, Minimal assistance Where Assessed-Toileting: Bedside Commode Toilet Transfer: Minimal assistance, Minimal verbal cueing Toilet Transfer Method: ProofreaderAmbulating Toilet Transfer Equipment: Bedside commode, AcupuncturistGrab bars Walk-In Shower Transfer: Minimal cueing, Minimal assistance Film/video editorWalk-In Shower Transfer Method: Technical sales engineerAmbulating Walk-In  Shower Equipment: Transfer tub bench      Therapy/Group: Group Therapy  Lakiyah Arntson A Brooks Stotz 05/01/2018, 12:53 PM

## 2018-05-02 ENCOUNTER — Inpatient Hospital Stay (HOSPITAL_COMMUNITY): Payer: Medicare Other | Admitting: Occupational Therapy

## 2018-05-02 ENCOUNTER — Inpatient Hospital Stay (HOSPITAL_COMMUNITY): Payer: Medicare Other | Admitting: Physical Therapy

## 2018-05-02 LAB — URINALYSIS, COMPLETE (UACMP) WITH MICROSCOPIC
Bacteria, UA: NONE SEEN
Bilirubin Urine: NEGATIVE
Glucose, UA: NEGATIVE mg/dL
HGB URINE DIPSTICK: NEGATIVE
Ketones, ur: NEGATIVE mg/dL
Leukocytes, UA: NEGATIVE
Nitrite: NEGATIVE
Protein, ur: NEGATIVE mg/dL
Specific Gravity, Urine: 1.024 (ref 1.005–1.030)
pH: 6 (ref 5.0–8.0)

## 2018-05-02 NOTE — Progress Notes (Signed)
Requested k-pad via [employer]

## 2018-05-02 NOTE — Progress Notes (Signed)
Occupational Therapy Session Note  Patient Details  Name: Cheryl Pearson MRN: [id number] Date of Birth: 05-06-1940  Today's Date: 05/02/2018 OT Individual Time: 2130-8657 OT Individual Time Calculation (min): 75 min    Short Term Goals: Week 1:  OT Short Term Goal 1 (Week 1): Pt will sequence through walk in shower transfer with no vc OT Short Term Goal 2 (Week 1): Pt will complete LB dressing with LRAD with CGA OT Short Term Goal 3 (Week 1): Pt will complete functional mobility to toilet with CGA and LRAD  Skilled Therapeutic Interventions/Progress Updates:    Upon entering the room, pt with no c/o pain and husband present in room. Pt agreeable to OT intervention. Skilled OT intervention with focus on functional transfers and use of AE to maintain precautions while increasing independence. OT educated and demonstrated use of LH reacher to obtain items from floor. Pt returning demonstration with min cuing to squat and min guard for standing balance with task. OT educated and demonstrated use of reacher, shoe button, sock aide, and shoe funnel to increase I in LB dressing tasks. Pt able to return demonstrations and get on B shoes and R AFO with increased time and mod cuing for technique and precautions. Pt standing and transitioning from chair <>chair with focus on no twisting when turning in tight spaces and moving as "one unit" in order to maintain precautions. Pt performed transfers with min guard for balance. Pt returning to sit in recliner chair at end of session with call bell and all needed items within reach.   Therapy Documentation Precautions:  Precautions Precautions: Back, Fall Required Braces or Orthoses: (no brace) Restrictions Weight Bearing Restrictions: No  Pain: Pain Assessment Pain Scale: 0-10 Pain Score: 6  Pain Type: Acute pain Pain Location: Shoulder Pain Orientation: Right Pain Descriptors / Indicators: Aching Pain Frequency: Intermittent Pain Onset:  On-going Pain Intervention(s): Medication (See eMAR) ADL: ADL Equipment Provided: Reacher, Sock aid Eating: Set up Where Assessed-Eating: Edge of bed Grooming: Supervision/safety Where Assessed-Grooming: Edge of bed Upper Body Bathing: Supervision/safety Where Assessed-Upper Body Bathing: Shower Lower Body Bathing: Minimal assistance, Minimal cueing Where Assessed-Lower Body Bathing: Shower Upper Body Dressing: Supervision/safety Where Assessed-Upper Body Dressing: Edge of bed Lower Body Dressing: Minimal assistance Where Assessed-Lower Body Dressing: Edge of bed Toileting: Minimal cueing, Minimal assistance Where Assessed-Toileting: Bedside Commode Toilet Transfer: Minimal assistance, Minimal verbal cueing Toilet Transfer Method: Proofreader: Bedside commode, Acupuncturist: Minimal cueing, Minimal assistance Film/video editor Method: Designer, industrial/product: Transfer tub bench   Therapy/Group: Individual Therapy  Alen Bleacher 05/02/2018, 1:03 PM

## 2018-05-02 NOTE — Progress Notes (Signed)
Occupational Therapy Session Note  Patient Details  Name: Cheryl Pearson MRN: [id number] Date of Birth: 06-Sep-1940  Today's Date: 05/02/2018 OT Individual Time: 1101-1200 OT Individual Time Calculation (min): 59 min   Short Term Goals: Week 1:  OT Short Term Goal 1 (Week 1): Pt will sequence through walk in shower transfer with no vc OT Short Term Goal 2 (Week 1): Pt will complete LB dressing with LRAD with CGA OT Short Term Goal 3 (Week 1): Pt will complete functional mobility to toilet with CGA and LRAD  Skilled Therapeutic Interventions/Progress Updates:    Pt greeted in recliner with spouse present. ADL needs met. Escorted pt to therapy apartment via w/c and for remainder of session worked on dynamic balance, DME safety, and precaution adherence during simulated kitchen tasks. Started with pt side-stepping towards Lt>Rt of counter using RW with steady assist to wash/dry counter. Pt required mod vcs for precaution adherence, as she'd try to twist body to reach other portions of counter. Next had pt retrieve bowl and soup can from cabinet and fridge for simulated meal prep. Provided pt/spouse with instructional and demonstrational cues for technique and then she completed this herself at ambulatory level with device. Mod A for squatting to retrieve items from her walker bag. Also provided pt with reacher bag and she practiced using it to retrieve small items from higher shelves. Since pt uses microwave frequently at home, suggested placing microwave on table so that she can complete seated meal prep to increase independence and maximize safety. Pt/spouse aware that spouse is to always be present with pt in kitchen during cleaning/meal prep activity. Throughout session, she required vcs for neutral base of support, walker mgt, and precaution adherence while engaging in functional activity. Pt returned to room via w/c and ambulated short distance back to recliner with steady assist and RW. Pt left with  all needs within reach and spouse present.   Therapy Documentation Precautions:  Precautions Precautions: Back, Fall Required Braces or Orthoses: (no brace) Restrictions Weight Bearing Restrictions: No Pain: RN notified of pts request for pain medication at end of session  Pain Assessment Pain Scale: 0-10 Pain Score: 6  Pain Type: Acute pain Pain Location: Shoulder Pain Orientation: Right Pain Descriptors / Indicators: Aching Pain Frequency: Intermittent Pain Onset: On-going Pain Intervention(s): Medication (See eMAR) ADL: ADL Equipment Provided: Reacher, Sock aid Eating: Set up Where Assessed-Eating: Edge of bed Grooming: Supervision/safety Where Assessed-Grooming: Edge of bed Upper Body Bathing: Supervision/safety Where Assessed-Upper Body Bathing: Shower Lower Body Bathing: Minimal assistance, Minimal cueing Where Assessed-Lower Body Bathing: Shower Upper Body Dressing: Supervision/safety Where Assessed-Upper Body Dressing: Edge of bed Lower Body Dressing: Minimal assistance Where Assessed-Lower Body Dressing: Edge of bed Toileting: Minimal cueing, Minimal assistance Where Assessed-Toileting: Bedside Commode Toilet Transfer: Minimal assistance, Minimal verbal cueing Toilet Transfer Method: Counselling psychologist: Bedside commode, Energy manager: Minimal cueing, Minimal assistance Social research officer, government Method: Heritage manager: Transfer tub bench      Therapy/Group: Individual Therapy  Bexlee Bergdoll A Naiah Donahoe 05/02/2018, 12:28 PM

## 2018-05-02 NOTE — Progress Notes (Signed)
Physical Therapy Session Note  Patient Details  Name: Cheryl Pearson MRN: [id number] Date of Birth: 06/18/1940  Today's Date: 05/02/2018 PT Individual Time: 1300-1400 PT Individual Time Calculation (min): 60 min   Short Term Goals: Week 1:  PT Short Term Goal 1 (Week 1): =LTG due to estimated LOS  Skilled Therapeutic Interventions/Progress Updates:    Pt received seated in recliner in room, agreeable to PT. No complaints of pain. Sit to stand with CGA to RW throughout therapy session. Orthotic check this PM, pt to receive a R GRAFO with toe cap. Ambulation x 150 ft with RW and CGA for balance with R GRAFO, occasional catching of R toes with gait. Pt demos improved hip flexion with gait this date as well as decreased ER. Sidesteps L/R with RW and min A for balance across hard level surface and then carpet to simulate home environment. Pt has increase in R foot catching on carpet, needs increased cueing to attend to RLE. Toilet transfer with CGA and RW. Discussed home setup with patient and her husband, they report a few steps to enter home. Discussion of how to safely ascend/descend stairs, will practice next session. Pt left seated in recliner in room with needs in reach, quick release belt and chair alarm in place.  Therapy Documentation Precautions:  Precautions Precautions: Back, Fall Required Braces or Orthoses: (no brace) Restrictions Weight Bearing Restrictions: No   Therapy/Group: Individual Therapy  Peter Congo, PT, DPT  05/02/2018, 2:59 PM

## 2018-05-02 NOTE — Progress Notes (Signed)
Orthopedic Tech Progress Note Patient Details:  Cheryl Pearson 08/24/1940 [id number]  Patient ID: Cheryl Pearson, female   DOB: 07/20/1940, 78 y.o.   MRN: [id number]   Cheryl Pearson 05/02/2018, 8:55 AMCalled [facility] for right AFO brace.

## 2018-05-02 NOTE — Progress Notes (Signed)
[place] PHYSICAL MEDICINE & REHABILITATION PROGRESS NOTE   Subjective/Complaints: Had some urinary incontinence last night. Denies any new pain. RLE a little stronger although still with significant foot drop. Wearing afO this morning  ROS: Patient denies fever, rash, sore throat, blurred vision, nausea, vomiting, diarrhea, cough, shortness of breath or chest pain,   headache, or mood change.    Objective:   No results found. No results for input(s): WBC, HGB, HCT, PLT in the last 72 hours. No results for input(s): NA, K, CL, CO2, GLUCOSE, BUN, CREATININE, CALCIUM in the last 72 hours.  Intake/Output Summary (Last 24 hours) at 05/02/2018 0819 Last data filed at 05/01/2018 1835 Gross per 24 hour  Intake 460 ml  Output -  Net 460 ml     Physical Exam: Vital Signs Blood pressure (!) 153/58, pulse 64, temperature (!) 97.3 F (36.3 C), temperature source Oral, resp. rate 18, height 5\' 5"  (1.651 m), weight 72.2 kg, SpO2 98 %. Constitutional: No distress . Vital signs reviewed. HEENT: EOMI, oral membranes moist Neck: supple Cardiovascular: RRR without murmur. No JVD    Respiratory: CTA Bilaterally without wheezes or rales. Normal effort    GI: BS +, non-tender, non-distended  Skin: neck/back incision cdi Musculoskeletal:back wound clean and intact. TTP Neurological: Patient is alert.  UE 5/5. RLE: HF/KE 2+/5, APF 2/5, ADF still with tr+?/5.  today.  LLE: 3/5 HF, KE and 4/5 ADF/PF. decreased LT/PP RLE and up to umbilicus right more affected than left. DTR's tr LE's     Assessment/Plan: 1. Functional deficits secondary to thoracic myelopathy which require 3+ hours per day of interdisciplinary therapy in a comprehensive inpatient rehab setting.  Physiatrist is providing close team supervision and 24 hour management of active medical problems listed below.  Physiatrist and rehab team continue to assess barriers to discharge/monitor patient progress toward functional and medical  goals  Care Tool:  Bathing    Body parts bathed by patient: Right arm, Left arm, Chest, Front perineal area, Abdomen, Buttocks, Right upper leg, Left upper leg, Face, Right lower leg, Left lower leg   Body parts bathed by helper: Buttocks     Bathing assist Assist Level: Minimal Assistance - Patient > 75%     Upper Body Dressing/Undressing Upper body dressing   What is the patient wearing?: Bra, Pull over shirt    Upper body assist Assist Level: Set up assist    Lower Body Dressing/Undressing Lower body dressing      What is the patient wearing?: Pants, Underwear/pull up     Lower body assist Assist for lower body dressing: Minimal Assistance - Patient > 75%     Toileting Toileting Toileting Activity did not occur Press photographer(Clothing management and hygiene only): Safety/medical concerns  Toileting assist Assist for toileting: Minimal Assistance - Patient > 75%     Transfers Chair/bed transfer  Transfers assist  Chair/bed transfer activity did not occur: Safety/medical concerns  Chair/bed transfer assist level: Contact Guard/Touching assist     Locomotion Ambulation   Ambulation assist   Ambulation activity did not occur: Safety/medical concerns  Assist level: Minimal Assistance - Patient > 75% Assistive device: Walker-rolling Max distance: 15450ft   Walk 10 feet activity   Assist     Assist level: Minimal Assistance - Patient > 75% Assistive device: Walker-rolling, Orthosis   Walk 50 feet activity   Assist    Assist level: Minimal Assistance - Patient > 75% Assistive device: Orthosis, Walker-rolling    Walk 150 feet activity  Assist Walk 150 feet activity did not occur: Safety/medical concerns  Assist level: Minimal Assistance - Patient > 75% Assistive device: Walker-platform, Orthosis    Walk 10 feet on uneven surface  activity   Assist Walk 10 feet on uneven surfaces activity did not occur: Safety/medical concerns          Wheelchair     Assist Will patient use wheelchair at discharge?: No Type of Wheelchair: Manual    Wheelchair assist level: Supervision/Verbal cueing Max wheelchair distance: 138ft    Wheelchair 50 feet with 2 turns activity    Assist        Assist Level: Supervision/Verbal cueing   Wheelchair 150 feet activity     Assist Wheelchair 150 feet activity did not occur: Safety/medical concerns   Assist Level: Supervision/Verbal cueing    Medical Problem List and Plan: 1.Decreased functional mobility with right lower extremity weaknesssecondary to cervical and thoracic myelopathy. Status post decompression T1-2. Continue slow taper of Decadron CIR PT OT -benefiting from off shelf. ordered AFO thru [employer] for RLE.  2. DVT Prophylaxis/Anticoagulation: SCDs.  Lower extremity venous Dopplers negative for DVT 3. Pain Management:Robaxin and oxycodone as needed  -back pain appears to be predominantly spastic-better when being proactive with robaxin   -changed frequency to q4 prn 4. Mood:Provide emotional support 5. Neuropsych: This patientiscapable of making decisions on herown behalf. 6. Skin/Wound Care:Routine skin checks 7. Fluids/Electrolytes/Nutrition:encourage PO   -protein supp for hypoalbuminemia  8. Hypertension. Norvasc 2.5 mg daily. Monitor with increased mobility Vitals:   05/01/18 2008 05/02/18 0350  BP: (!) 144/61 (!) 153/58  Pulse: 70 64  Resp:  18  Temp:  (!) 97.3 F (36.3 C)  SpO2:  98%   fair control 9. Hyperlipidemia. Zetia 10. Constipation. Laxative assistance, encourage appropriate PO intake 11. Elevated LFT"s---likely reactive  -LFT's improving. Follow up next week 12.  Low-grade temp x1, 99.9 over weekend, had episode of urinary incontinence last night with increased frequency as a whole  -will check ua, ucx today  LOS: 6 days A FACE TO FACE EVALUATION WAS PERFORMED  Ranelle Oyster 05/02/2018, 8:19 AM

## 2018-05-03 ENCOUNTER — Inpatient Hospital Stay (HOSPITAL_COMMUNITY): Payer: Medicare Other | Admitting: Physical Therapy

## 2018-05-03 ENCOUNTER — Inpatient Hospital Stay (HOSPITAL_COMMUNITY): Payer: Medicare Other | Admitting: Occupational Therapy

## 2018-05-03 NOTE — Progress Notes (Signed)
Physical Therapy Session Note  Patient Details  Name: Cheryl Pearson MRN: [id number] Date of Birth: 1941-01-23  Today's Date: 05/03/2018 PT Individual Time: 0930-1000 PT Individual Time Calculation (min): 30 min   Short Term Goals: Week 1:  PT Short Term Goal 1 (Week 1): =LTG due to estimated LOS  Skilled Therapeutic Interventions/Progress Updates:    Pt received seated in recliner in room, agreeable to PT. Pt reports upper back/shoulder pain at rest, use of heating pad for pain relief. Stand pivot transfer recliner to bed with RW and CGA. Sit to supine mod A for BLE management. Reviewed handout of supine HEP: heel slides, hip abd, quad sets, hip add squeeze, SKFO. Pt's husband demos good understanding of how to provide [facility] for RLE therex. Supine to sit with min A for trunk control. Stand pivot transfer bed to recliner with RW and CGA. Pt left seated in recliner in room with needs in reach and husband present, heating pad to upper back.  Therapy Documentation Precautions:  Precautions Precautions: Back, Fall Required Braces or Orthoses: (no brace) Restrictions Weight Bearing Restrictions: No    Therapy/Group: Individual Therapy  Peter Congo, PT, DPT  05/03/2018, 10:09 AM

## 2018-05-03 NOTE — Patient Care Conference (Signed)
Inpatient RehabilitationTeam Conference and Plan of Care Update Date: 05/03/2018   Time: 2:10 PM    Patient Name: Cheryl Pearson      Medical Record Number: [id number]  Date of Birth: 03/03/1941 Sex: Female         Room/Bed: 4W02C/4W02C-01 Payor Info: Payor: Advertising copywriter[employer] HEALTHCARE MEDICARE / Plan: UHC MEDICARE / Product Type: *No Product type* /    Admitting Diagnosis: thoracic decompression with myelopathy  Admit Date/Time:  04/26/2018  5:03 PM Admission Comments: No comment available   Primary Diagnosis:  <principal problem not specified> Principal Problem: <principal problem not specified>  Patient Active Problem List   Diagnosis Date Noted  . Essential hypertension   . Cervical spondylosis without myelopathy   . Steroid-induced hyperglycemia   . Postoperative pain   . Hyponatremia   . Leucocytosis   . Thoracic myelopathy 04/22/2018  . ALLERGIC RHINITIS 01/19/2007    Expected Discharge Date: Expected Discharge Date: 05/06/18  Team Members Present: Physician leading conference: Dr. Faith RogueZachary Swartz Social Worker Present: Staci AcostaJenny Esgar Barnick, LCSW Nurse Present: Willey BladeKaren Winter, RN PT Present: Other (comment)(Taylor Serita Griturkalo, PT) OT Present: Other (comment)(Sandra Earlene Plateravis, OT) SLP Present: Colin BentonMadison Cratch, SLP PPS Coordinator present : Fae PippinMelissa Bowie     Current Status/Progress Goal Weekly Team Focus  Medical   Thoracic myelopathy with right greater than left weakness and sensory loss.  Bowel bladder have been functioning.  Pain levels improving  Increase functional mobility  Improved pain control, measuring and fitting with custom right AFO   Bowel/Bladder   pt is continent of B/B LBM 01/05  remain continent of B/B with normal bowel pattern  toilet q2h and prn laxatives prn   Swallow/Nutrition/ Hydration             ADL's   bathing from TTB with set upA/S with LH sponge, figure 4 for LB dressing with min guard, UB set up A, functional transfers with RW/AFO S - min guard  supervision  overall  d/c planning, ADL training, balance, functional transfers, pt/family education   Mobility   min to mod A bed mobility, CGA transfers, gait CGA to min A up to 150' with RW with R AFO  S overall  RLE NMR, gait training, AFO assessment   Communication             Safety/Cognition/ Behavioral Observations            Pain   pt c/o back/neck pain controlled with tylenol prn Robaxin prn   pain <=3/10  assess pain qshift and prn   Skin   surgical incision upper back midline honeycomb dressing CDI  pt will be free of infection or skin breakdown  assess skin qshift and prn    Rehab Goals Patient on target to meet rehab goals: Yes Rehab Goals Revised: none *See Care Plan and progress notes for long and short-term goals.     Barriers to Discharge  Current Status/Progress Possible Resolutions Date Resolved   Physician             n/a      Nursing                  PT                    OT                  SLP                SW  Discharge Planning/Teaching Needs:  Pt plans to return to her home with her husband to be with her.  Husband has been at bedside for therapies and has received necessary education.   Team Discussion:  Pt with new R AFO brace today.  She is working through pain issues and is motivated to work and get better.  She is anxious at times.  Pt has no bowel and bladder issues.  RN asked if safety belt could be left off, but therapists feel pt can be impulsive at times, so they recommend only leaving it off when husband is present.  Pt is contact guard for transfers and contact guard/min A for gait with RW.  Pt is supervision to min guard with ADLs and transfers.  Revisions to Treatment Plan:  none    Continued Need for Acute Rehabilitation Level of Care: The patient requires daily medical management by a physician with specialized training in physical medicine and rehabilitation for the following conditions: Daily direction of a  multidisciplinary physical rehabilitation program to ensure safe treatment while eliciting the highest outcome that is of practical value to the patient.: Yes Daily medical management of patient stability for increased activity during participation in an intensive rehabilitation regime.: Yes Daily analysis of laboratory values and/or radiology reports with any subsequent need for medication adjustment of medical intervention for : Post surgical problems;Neurological problems   I attest that I was present, lead the team conference, and concur with the assessment and plan of the team.   Meghana Tullo, [place] 05/03/2018, 2:25 PM

## 2018-05-03 NOTE — Progress Notes (Signed)
Social Work Patient ID: Flavia Shipper, female   DOB: May 27, 1940, 78 y.o.   MRN: [id number]   CSW met with pt and pt's husband to update them on team conference discussion and targeted d/c date of 05-06-18.  Pt is pleased to have had the opportunity to come to CIR and she feels much more confident going home now than she would have straight from the acute hospital.  Husband admits to some anxiety with pt coming home, but feels some of the tasks they will just have to be home to figure out.  He feel positive that they will work it out together.  Discussed outpt therapy f/u options and husband wishes to discuss with pt before CSW makes a referral.  CSW to check in with pt/husband to see what they have decided to do for outpt therapy and CSW will arrange as they wish.  Talked about needed DME to include BSC, shower seat, and rolling walker which CSW will order prior to d/c.  CSW will continue to follow and prepare pt for d/c.

## 2018-05-03 NOTE — Progress Notes (Signed)
Occupational Therapy Session Note  Patient Details  Name: Cheryl Pearson MRN: [id number] Date of Birth: 31-Oct-1940  Today's Date: 05/03/2018 OT Individual Time: 5885-0277 OT Individual Time Calculation (min): 87 min    Short Term Goals: Week 1:  OT Short Term Goal 1 (Week 1): Pt will sequence through walk in shower transfer with no vc OT Short Term Goal 2 (Week 1): Pt will complete LB dressing with LRAD with CGA OT Short Term Goal 3 (Week 1): Pt will complete functional mobility to toilet with CGA and LRAD  Skilled Therapeutic Interventions/Progress Updates:    Upon entering the room, pt in bathroom with NT. Pt agreeable to shower this session and doffing clothing from seated position for safety. Pt transferring with RW and min guard onto TTB without cuing to maintain precautions. Pt bathing from seated position with with overall supervision and use of LH sponge to increase independence. Pt ambulating in same manner to sit on EOB to don clothing items. Pt able to utilize figure 4 position to thread underwear, pants, and socks with min guard for standing balance when pulling over buttocks. UB self care with supervision from seated position. Pt transitioning to recliner chair for more comfort with RW and close supervision for stand pivot transfer. Pt very excited over progress. OT reviewed pt's goals and discussed follow up therapy recommendations for outpatient therapies. Pt remained seated in chair with husband present and call bell within reach.   Therapy Documentation Precautions:  Precautions Precautions: Back, Fall Required Braces or Orthoses: (no brace) Restrictions Weight Bearing Restrictions: No ADL: ADL Equipment Provided: Reacher, Sock aid Eating: Set up Where Assessed-Eating: Edge of bed Grooming: Supervision/safety Where Assessed-Grooming: Edge of bed Upper Body Bathing: Supervision/safety Where Assessed-Upper Body Bathing: Shower Lower Body Bathing: Minimal assistance,  Minimal cueing Where Assessed-Lower Body Bathing: Shower Upper Body Dressing: Supervision/safety Where Assessed-Upper Body Dressing: Edge of bed Lower Body Dressing: Minimal assistance Where Assessed-Lower Body Dressing: Edge of bed Toileting: Minimal cueing, Minimal assistance Where Assessed-Toileting: Bedside Commode Toilet Transfer: Minimal assistance, Minimal verbal cueing Toilet Transfer Method: Proofreader: Bedside commode, Acupuncturist: Minimal cueing, Minimal assistance Film/video editor Method: Designer, industrial/product: Transfer tub bench   Therapy/Group: Individual Therapy  Alen Bleacher 05/03/2018, 11:26 AM

## 2018-05-03 NOTE — Progress Notes (Signed)
[place] PHYSICAL MEDICINE & REHABILITATION PROGRESS NOTE   Subjective/Complaints: Up in chair. No new complaints.   ROS: Patient denies fever, rash, sore throat, blurred vision, nausea, vomiting, diarrhea, cough, shortness of breath or chest pain, joint or back pain, headache, or mood change.   Objective:   No results found. No results for input(s): WBC, HGB, HCT, PLT in the last 72 hours. No results for input(s): NA, K, CL, CO2, GLUCOSE, BUN, CREATININE, CALCIUM in the last 72 hours.  Intake/Output Summary (Last 24 hours) at 05/03/2018 0826 Last data filed at 05/02/2018 1740 Gross per 24 hour  Intake 120 ml  Output -  Net 120 ml     Physical Exam: Vital Signs Blood pressure 137/70, pulse 64, temperature 98.3 F (36.8 C), temperature source Oral, resp. rate 18, height 5\' 5"  (1.651 m), weight 72.2 kg, SpO2 97 %. Constitutional: No distress . Vital signs reviewed. HEENT: EOMI, oral membranes moist Neck: supple Cardiovascular: RRR without murmur. No JVD    Respiratory: CTA Bilaterally without wheezes or rales. Normal effort    GI: BS +, non-tender, non-distended  Skin: neck/back incision cdi--stable Musculoskeletal:back wound clean and intact. TTP Neurological: Patient is alert.  UE 5/5. RLE: HF/KE 2+ -3/5, APF 2/5, ADF still with tr/5.  today.  LLE: 3/5 HF, KE and 4/5 ADF/PF. decreased LT/PP RLE and up to umbilicus right more affected than left--stable. DTR's tr LE's     Assessment/Plan: 1. Functional deficits secondary to thoracic myelopathy which require 3+ hours per day of interdisciplinary therapy in a comprehensive inpatient rehab setting.  Physiatrist is providing close team supervision and 24 hour management of active medical problems listed below.  Physiatrist and rehab team continue to assess barriers to discharge/monitor patient progress toward functional and medical goals  Care Tool:  Bathing    Body parts bathed by patient: Right arm, Left arm, Chest,  Front perineal area, Abdomen, Buttocks, Right upper leg, Left upper leg, Face, Right lower leg, Left lower leg   Body parts bathed by helper: Buttocks     Bathing assist Assist Level: Minimal Assistance - Patient > 75%     Upper Body Dressing/Undressing Upper body dressing   What is the patient wearing?: Bra, Pull over shirt    Upper body assist Assist Level: Set up assist    Lower Body Dressing/Undressing Lower body dressing      What is the patient wearing?: Pants, Underwear/pull up     Lower body assist Assist for lower body dressing: Minimal Assistance - Patient > 75%     Toileting Toileting Toileting Activity did not occur Press photographer and hygiene only): Safety/medical concerns  Toileting assist Assist for toileting: Minimal Assistance - Patient > 75%     Transfers Chair/bed transfer  Transfers assist  Chair/bed transfer activity did not occur: Safety/medical concerns  Chair/bed transfer assist level: Contact Guard/Touching assist     Locomotion Ambulation   Ambulation assist   Ambulation activity did not occur: Safety/medical concerns  Assist level: Contact Guard/Touching assist Assistive device: Walker-rolling Max distance: 160ft   Walk 10 feet activity   Assist     Assist level: Contact Guard/Touching assist Assistive device: Walker-rolling, Orthosis   Walk 50 feet activity   Assist    Assist level: Contact Guard/Touching assist Assistive device: Orthosis, Walker-rolling    Walk 150 feet activity   Assist Walk 150 feet activity did not occur: Safety/medical concerns  Assist level: Contact Guard/Touching assist Assistive device: Walker-platform, Orthosis    Walk 10  feet on uneven surface  activity   Assist Walk 10 feet on uneven surfaces activity did not occur: Safety/medical concerns         Wheelchair     Assist Will patient use wheelchair at discharge?: No Type of Wheelchair: Manual    Wheelchair assist  level: Supervision/Verbal cueing Max wheelchair distance: 1250ft    Wheelchair 50 feet with 2 turns activity    Assist        Assist Level: Supervision/Verbal cueing   Wheelchair 150 feet activity     Assist Wheelchair 150 feet activity did not occur: Safety/medical concerns   Assist Level: Supervision/Verbal cueing    Medical Problem List and Plan: 1.Decreased functional mobility with right lower extremity weaknesssecondary to cervical and thoracic myelopathy. Status post decompression T1-2. Continue slow taper of Decadron CIR PT OT -pt was measured for AFO yesterday, fitting today 2. DVT Prophylaxis/Anticoagulation: SCDs.  Lower extremity venous Dopplers negative for DVT 3. Pain Management:Robaxin and oxycodone as needed  -back pain appears to be predominantly spastic-better when being proactive with robaxin   -changed frequency to q4 prn 4. Mood:Provide emotional support 5. Neuropsych: This patientiscapable of making decisions on herown behalf. 6. Skin/Wound Care:Routine skin checks 7. Fluids/Electrolytes/Nutrition:encourage PO   -protein supp for hypoalbuminemia  8. Hypertension. Norvasc 2.5 mg daily. Monitor with increased mobility Vitals:   05/02/18 1932 05/03/18 0432  BP: (!) 129/52 137/70  Pulse: 71 64  Resp: 18 18  Temp: 98.8 F (37.1 C) 98.3 F (36.8 C)  SpO2: 99% 97%   fair control 9. Hyperlipidemia. Zetia 10. Constipation. Laxative assistance, encourage appropriate PO intake 11. Elevated LFT"s---likely reactive  -LFT's improving. Follow up next week 12.  Low-grade temp x1, 99.9 over weekend. Still some frequency  -ua neg, ucx pending    LOS: 7 days A FACE TO FACE EVALUATION WAS PERFORMED  Ranelle OysterZachary T  05/03/2018, 8:26 AM

## 2018-05-03 NOTE — Progress Notes (Signed)
Occupational Therapy Session Note  Patient Details  Name: Cheryl Pearson MRN: [id number] Date of Birth: [birth year]  Today's Date: 05/03/2018 OT Individual Time: 0174-9449 OT Individual Time Calculation (min): 35 min    Short Term Goals: Week 1:  OT Short Term Goal 1 (Week 1): Pt will sequence through walk in shower transfer with no vc OT Short Term Goal 2 (Week 1): Pt will complete LB dressing with LRAD with CGA OT Short Term Goal 3 (Week 1): Pt will complete functional mobility to toilet with CGA and LRAD  Skilled Therapeutic Interventions/Progress Updates:    Pt received in recliner with kpad on back stating the heat was very helpful. She talked about her mid back/ posterior shoulder pain and discussed how her positioning with shoulder elevation with sit to stand could be contributing.  Demonstrated with return demonstration gentle neck stretch and upper back stretch.   Most of the session focused on techniques with sit to stand to emphasize use of her legs and her arms just for support.  Pt began with simple rocks and a small push to get up, progressing to a full elbow extension with sit to partial stand, to a full stand.  Pt was able to rise to stand with close S using a good forward weight shift. She ambulated to bathroom with rW and used toilet with S and then ambulated back with CGA to her recliner.  Provided pt with a hot pack to try for 30 min as a stronger heat option over the kpad.  The k pad can be used continuously but hot pack loses its strength.  Pt's spouse in room with pt and all needs met.  Therapy Documentation Precautions:  Precautions Precautions: Back, Fall Required Braces or Orthoses: (no brace) Restrictions Weight Bearing Restrictions: No    Pain: pt reported pain/ discomfort behind shoulder blades, see note for interventions used   ADL: ADL Equipment Provided: Reacher, Sock aid Eating: Set up Where Assessed-Eating: Edge of bed Grooming:  Supervision/safety Where Assessed-Grooming: Edge of bed Upper Body Bathing: Supervision/safety Where Assessed-Upper Body Bathing: Shower Lower Body Bathing: Minimal assistance, Minimal cueing Where Assessed-Lower Body Bathing: Shower Upper Body Dressing: Supervision/safety Where Assessed-Upper Body Dressing: Edge of bed Lower Body Dressing: Minimal assistance Where Assessed-Lower Body Dressing: Edge of bed Toileting: Minimal cueing, Minimal assistance Where Assessed-Toileting: Bedside Commode Toilet Transfer: Minimal assistance, Minimal verbal cueing Toilet Transfer Method: Counselling psychologist: Bedside commode, Energy manager: Minimal cueing, Minimal assistance Social research officer, government Method: Heritage manager: Transfer tub bench   Therapy/Group: Individual Therapy  [place] 05/03/2018, 9:29 AM

## 2018-05-03 NOTE — Progress Notes (Signed)
Physical Therapy Session Note  Patient Details  Name: Cheryl Pearson MRN: [id number] Date of Birth: January 22, 1941  Today's Date: 05/03/2018 PT Individual Time: 2233-6122 PT Individual Time Calculation (min): 56 min   Short Term Goals: Week 1:  PT Short Term Goal 1 (Week 1): =LTG due to estimated LOS  Skilled Therapeutic Interventions/Progress Updates:    Patient received up in [facility], eager to participate in PT session and excited about her new AFO and toe cap. Had her self-propel WC multiple distances of 288f with B UEs during session for improved functional activity tolerance and UE strength, then performed car transfer twice with S-light min guard and cues for safety and sequencing, discussed set-up of their personal car and problem solving such as how to apply seat belt/maintain precautions, hand placement for transfer, etc. Then transported her [facility], and practice navigation of four 6-inch steps with B railings, Mod cues for correct sequencing, and min guard provided; performed steps twice overall with improved sequencing/reduced cues on second attempt. She was then able to tolerate gait training approximately 1352fwith RW and AFO/toe cap, continue to note slight foot dragging with swing phase but much improved as compared to no orthosis/cap and patient also fatigued this afternoon which likely effected performance. She was returned [facility] transferred to recliner with RW and min guard, left up in recliner with all needs met and husband providing direct supervision.   Therapy Documentation Precautions:  Precautions Precautions: Back, Fall Required Braces or Orthoses: (no brace) Restrictions Weight Bearing Restrictions: No General: Pain: Pain Assessment Pain Scale: 0-10 Pain Score: 2  Pain Type: Acute pain Pain Location: Back Pain Orientation: Upper;Medial Pain Descriptors / Indicators: Aching;Sore Pain Onset:  On-going Patients Stated Pain Goal: 0 Pain Intervention(s): Ambulation/increased activity;Repositioned;Emotional support Multiple Pain Sites: No    Therapy/Group: Individual Therapy  KrDeniece ReeT, DPT, CBIS  Supplemental Physical Therapist [facility]  Pager [phone] Re[facility] Office [phone] 05/03/2018, 4:01 PM

## 2018-05-04 ENCOUNTER — Inpatient Hospital Stay (HOSPITAL_COMMUNITY): Payer: Medicare Other | Admitting: Occupational Therapy

## 2018-05-04 ENCOUNTER — Inpatient Hospital Stay (HOSPITAL_COMMUNITY): Payer: Medicare Other

## 2018-05-04 ENCOUNTER — Inpatient Hospital Stay (HOSPITAL_COMMUNITY): Payer: Medicare Other | Admitting: Physical Therapy

## 2018-05-04 LAB — URINE CULTURE

## 2018-05-04 NOTE — Progress Notes (Signed)
Occupational Therapy Session Note  Patient Details  Name: Cheryl Pearson MRN: [id number] Date of Birth: Nov 18, 1940  Today's Date: 05/04/2018 OT Individual Time: 1530-1600 OT Individual Time Calculation (min): 30 min    Short Term Goals: Week 1:  OT Short Term Goal 1 (Week 1): Pt will sequence through walk in shower transfer with no vc OT Short Term Goal 2 (Week 1): Pt will complete LB dressing with LRAD with CGA OT Short Term Goal 3 (Week 1): Pt will complete functional mobility to toilet with CGA and LRAD  Skilled Therapeutic Interventions/Progress Updates:    Session focused on dynamic standing balance and functional stepping. Pt was received sitting up in w/c with no c/o pain agreeable to therapy. Pt completed 100 ft of w/c propulsion before needing a rest break and propelling another 50 ft. Pt completed SPT to therapy mat with min cueing for LE placement/body mechanics. Pt used RW to stand from EOM and alternate LE stepping onto 3 in threshold. Cueing provided for upright posture and reducing pressure through B UE on RW. Pt returned to her room and was left sitting up in the recliner with all needs met.   Therapy Documentation Precautions:  Precautions Precautions: Back, Fall Required Braces or Orthoses: (no brace) Restrictions Weight Bearing Restrictions: No Vital Signs: Therapy Vitals Temp: 98.7 F (37.1 C) Temp Source: Oral Pulse Rate: 93 Resp: 18 BP: (!) 143/59 Patient Position (if appropriate): Sitting Oxygen Therapy SpO2: 98 % O2 Device: Room Air Pain: Pain Assessment Pain Scale: 0-10 Pain Score: 0-No pain   Therapy/Group: Individual Therapy  Curtis Sites 05/04/2018, 4:33 PM

## 2018-05-04 NOTE — Progress Notes (Signed)
Occupational Therapy Session Note  Patient Details  Name: Cheryl Pearson MRN: [id number] Date of Birth: 13-Jul-1940  Today's Date: 05/04/2018 OT Individual Time: 1030-1130 OT Individual Time Calculation (min): 60 min    Short Term Goals: Week 1:  OT Short Term Goal 1 (Week 1): Pt will sequence through walk in shower transfer with no vc OT Short Term Goal 2 (Week 1): Pt will complete LB dressing with LRAD with CGA OT Short Term Goal 3 (Week 1): Pt will complete functional mobility to toilet with CGA and LRAD      Skilled Therapeutic Interventions/Progress Updates:    Pt received in recliner with spouse in the room. Pt was already dressed for the day and declined a shower.  Used this session time to problem solve strategies for donning her R shoe with AFO.  She will continue to need some help from her spouse as it would not be possible for her to don shoe completely without integrating thoracic rotation, but she can do 75% by herself. She crosses her leg, places toe in shoe, then positions brace around calf with some A.  Using the shoe funnel she places her foot on the ground and pushes it in. She needs help to wind strap around and place through fastener. She then crosses leg and is able to fasten shoe with shoe button.  She had difficulty finding the right loop with tactile sensation only so placed a small piece of coban wrapping around loop and then pt was able to find easily.   She also worked on sit to stand to [employer], using the bathroom, standing with RW with side stepping and forward stepping, and small squats. Pt resting in recliner with all needs met.  Therapy Documentation Precautions:  Precautions Precautions: Back, Fall Required Braces or Orthoses: (no brace) Restrictions Weight Bearing Restrictions: No  Pain: Pain Assessment Pain Scale: 0-10 Pain Score: 4  Pain Type: Acute pain Pain Location: Shoulder Pain Orientation: Right Pain Descriptors / Indicators: Aching;Sore Pain  Frequency: Intermittent Pain Onset: On-going Patients Stated Pain Goal: 0 Pain Intervention(s): Emotional support ADL: ADL Equipment Provided: Reacher, Sock aid Eating: Set up Where Assessed-Eating: Edge of bed Grooming: Supervision/safety Where Assessed-Grooming: Edge of bed Upper Body Bathing: Supervision/safety Where Assessed-Upper Body Bathing: Shower Lower Body Bathing: Minimal assistance, Minimal cueing Where Assessed-Lower Body Bathing: Shower Upper Body Dressing: Supervision/safety Where Assessed-Upper Body Dressing: Edge of bed Lower Body Dressing: Minimal assistance Where Assessed-Lower Body Dressing: Edge of bed Toileting: Minimal cueing, Minimal assistance Where Assessed-Toileting: Bedside Commode Toilet Transfer: Minimal assistance, Minimal verbal cueing Toilet Transfer Method: Counselling psychologist: Bedside commode, Energy manager: Minimal cueing, Minimal assistance Social research officer, government Method: Heritage manager: Transfer tub bench   Therapy/Group: Individual Therapy  [place] 05/04/2018, 12:40 PM

## 2018-05-04 NOTE — Plan of Care (Signed)
  Problem: Consults Goal: RH SPINAL CORD INJURY PATIENT EDUCATION Description  See Patient Education module for education specifics.  Outcome: Progressing   Problem: RH SAFETY Goal: RH STG ADHERE TO SAFETY PRECAUTIONS W/ASSISTANCE/DEVICE Description STG Adhere to Safety Precautions With mod I Assistance/Device.  Outcome: Progressing   Problem: RH PAIN MANAGEMENT Goal: RH STG PAIN MANAGED AT OR BELOW PT'S PAIN GOAL Description Less than 3 out of 10  Outcome: Progressing   Problem: RH KNOWLEDGE DEFICIT SCI Goal: RH STG INCREASE KNOWLEDGE OF SELF CARE AFTER SCI Outcome: Progressing

## 2018-05-04 NOTE — Progress Notes (Signed)
Physical Therapy Session Note  Patient Details  Name: Cheryl Pearson MRN: [id number] Date of Birth: Apr 19, 1941  Today's Date: 05/04/2018 PT Individual Time: 0800-0930 PT Individual Time Calculation (min): 90 min   Short Term Goals: Week 1:  PT Short Term Goal 1 (Week 1): =LTG due to estimated LOS  Skilled Therapeutic Interventions/Progress Updates:    Pt received seated EOB dressed and eager to begin PT session. See pain details below, RN provides pain medication at beginning of therapy session and this therapist applies heating pad to upper back/shoulders at end of session for pain management. Pt transfers sit to stand with Supervision to RW throughout therapy session, v/c for safe hand placement with transfer. Toilet transfer with Supervision, Supervision for 3/3 toileting steps. Ambulation x 150 ft with RW and CGA for balance with R GRAFO and toe cap. Pt does continue to exhibit R toe drag with gait but is improved when focusing on hip flexion and heel strike with gait. Ascend/descend 4 stairs laterally with one handrail and min A ascending with LLE first, mod A to ascend with R LE first due to assist needed to bring RLE up onto step and for balance. Discussed home setup and safest way for patient and her husband to manage her stairs at the front entrance of their home. Standing 1" and 2" obstacle stepovers with use of RW and min A for balance, focus on RLE hip flexion and clearance with obstacle navigation. Nustep level 3 x 10 min with BLE only, use of R thigh strap for improved alignment and decreased hip ER. Pt left seated in recliner in room at end of therapy session with needs in reach, husband present.  Therapy Documentation Precautions:  Precautions Precautions: Back, Fall Required Braces or Orthoses: (no brace) Restrictions Weight Bearing Restrictions: No Pain: Pain Assessment Pain Scale: 0-10 Pain Score: 9  Pain Type: Acute pain Pain Location: Shoulder Pain Orientation:  Right Pain Radiating Towards: Back Pain Descriptors / Indicators: Aching;Sore Pain Frequency: Intermittent Pain Onset: On-going Patients Stated Pain Goal: 0 Pain Intervention(s): Medication (See eMAR);Emotional support   Therapy/Group: Individual Therapy  Peter Congo, PT, DPT  05/04/2018, 9:59 AM

## 2018-05-04 NOTE — Progress Notes (Signed)
Occupational Therapy Session Note  Patient Details  Name: Cheryl Pearson MRN: [id number] Date of Birth: 11/20/1940  Today's Date: 05/04/2018 OT Individual Time: 1330-1413 OT Individual Time Calculation (min): 43 min    Short Term Goals: Week 1:  OT Short Term Goal 1 (Week 1): Pt will sequence through walk in shower transfer with no vc OT Short Term Goal 2 (Week 1): Pt will complete LB dressing with LRAD with CGA OT Short Term Goal 3 (Week 1): Pt will complete functional mobility to toilet with CGA and LRAD  Skilled Therapeutic Interventions/Progress Updates:    Pt received in w/c with husband present after assisting pt to toilet. Pt completes stand pivot transfer with S to w/c. Pt and OT discuss shower set up and DME options. Pt completes posterior method ambulatory shower transfer stepping over small lip with Sup overall and VC for hand placemetn. Pt completes standing towel folding with S and 1# wrist weights for BUE strength/endurance. Pt educated on energy conservaiton strategies as well as given handout with list of adaptations of ADL/IADL. Exited session with pt setaed in recliner, call light in reach and all needs met  Therapy Documentation Precautions:  Precautions Precautions: Back, Fall Required Braces or Orthoses: (no brace) Restrictions Weight Bearing Restrictions: No   Therapy/Group: Individual Therapy  Stephanie M Schlosser 05/04/2018, 2:14 PM 

## 2018-05-04 NOTE — Progress Notes (Signed)
Physical Therapy Session Note  Patient Details  Name: Cheryl Pearson MRN: [id number] Date of Birth: 09/29/1940  Today's Date: 05/04/2018 PT Individual Time: 1130-1200 PT Individual Time Calculation (min): 30 min   Short Term Goals: Week 1:  PT Short Term Goal 1 (Week 1): =LTG due to estimated LOS  Skilled Therapeutic Interventions/Progress Updates:    Pt received seated in recliner in room, agreeable to extra PT session. No complaints of pain. Stand pivot transfer recliner to w/c with RW and CGA. Session focus on bed mobility in real bed in simulation apartment. Sit to supine with mod A for BLE management on both R and L side of bed. Supine to sit with Supervision. V/c for logroll technique with bed mobility to maintain spinal precautions. Education with patient and her husband that pt may need assist with bed mobility upon d/c due to inability to get her BLE into bed independently at this time. Pt left seated in recliner in room with needs in reach and husband present at end of therapy session.  Therapy Documentation Precautions:  Precautions Precautions: Back, Fall Required Braces or Orthoses: (no brace) Restrictions Weight Bearing Restrictions: No   Therapy/Group: Individual Therapy  Peter Congoaylor Ralyn Stlaurent, PT, DPT  05/04/2018, 12:55 PM

## 2018-05-04 NOTE — Progress Notes (Signed)
[facility] PHYSICAL MEDICINE & REHABILITATION PROGRESS NOTE   Subjective/Complaints: Up at eob putting on AFO. Pleased with progress  ROS: Patient denies fever, rash, sore throat, blurred vision, nausea, vomiting, diarrhea, cough, shortness of breath or chest pain, joint or back pain, headache, or mood change.    Objective:   No results found. No results for input(s): WBC, HGB, HCT, PLT in the last 72 hours. No results for input(s): NA, K, CL, CO2, GLUCOSE, BUN, CREATININE, CALCIUM in the last 72 hours.  Intake/Output Summary (Last 24 hours) at 05/04/2018 0823 Last data filed at 05/03/2018 1319 Gross per 24 hour  Intake 240 ml  Output -  Net 240 ml     Physical Exam: Vital Signs Blood pressure (!) 152/63, pulse 62, temperature 98.2 F (36.8 C), temperature source Oral, resp. rate 19, height 5\' 5"  (1.651 m), weight 72.2 kg, SpO2 99 %. Constitutional: No distress . Vital signs reviewed. HEENT: EOMI, oral membranes moist Neck: supple Cardiovascular: RRR without murmur. No JVD    Respiratory: CTA Bilaterally without wheezes or rales. Normal effort    GI: BS +, non-tender, non-distended  Skin: wound cdi Musculoskeletal:back wound clean and intact. TTP Neurological: Patient is alert.  UE 5/5. RLE: HF/KE 2+ -3/5, APF 2/5, ADF still with tr/5.  today.  LLE: 3/5 HF, KE and 4/5 ADF/PF. decreased LT/PP RLE and up to umbilicus right more affected than left--stable motor and sensory exam. DTR's tr LE's     Assessment/Plan: 1. Functional deficits secondary to thoracic myelopathy which require 3+ hours per day of interdisciplinary therapy in a comprehensive inpatient rehab setting.  Physiatrist is providing close team supervision and 24 hour management of active medical problems listed below.  Physiatrist and rehab team continue to assess barriers to discharge/monitor patient progress toward functional and medical goals  Care Tool:  Bathing    Body parts bathed by patient: Right  arm, Left arm, Chest, Front perineal area, Abdomen, Buttocks, Right upper leg, Left upper leg, Face, Right lower leg, Left lower leg   Body parts bathed by helper: Buttocks     Bathing assist Assist Level: Supervision/Verbal cueing     Upper Body Dressing/Undressing Upper body dressing   What is the patient wearing?: Bra, Pull over shirt    Upper body assist Assist Level: Supervision/Verbal cueing    Lower Body Dressing/Undressing Lower body dressing      What is the patient wearing?: Pants, Underwear/pull up     Lower body assist Assist for lower body dressing: Contact Guard/Touching assist     Toileting Toileting Toileting Activity did not occur (Clothing management and hygiene only): Safety/medical concerns  Toileting assist Assist for toileting: Supervision/Verbal cueing     Transfers Chair/bed transfer  Transfers assist  Chair/bed transfer activity did not occur: Safety/medical concerns  Chair/bed transfer assist level: Contact Guard/Touching assist     Locomotion Ambulation   Ambulation assist   Ambulation activity did not occur: Safety/medical concerns  Assist level: Contact Guard/Touching assist Assistive device: Walker-rolling(AFO and shoe cap ) Max distance: 14830ft    Walk 10 feet activity   Assist     Assist level: Contact Guard/Touching assist Assistive device: Walker-rolling, Orthosis(AFO and toe cap )   Walk 50 feet activity   Assist    Assist level: Contact Guard/Touching assist Assistive device: Orthosis, Walker-rolling(AFO and toe cap )    Walk 150 feet activity   Assist Walk 150 feet activity did not occur: Safety/medical concerns  Assist level: Contact Guard/Touching assist Assistive  device: Walker-platform, Orthosis    Walk 10 feet on uneven surface  activity   Assist Walk 10 feet on uneven surfaces activity did not occur: Safety/medical concerns         Wheelchair     Assist Will patient use wheelchair at  discharge?: No Type of Wheelchair: Manual    Wheelchair assist level: Supervision/Verbal cueing Max wheelchair distance: 23500ft     Wheelchair 50 feet with 2 turns activity    Assist        Assist Level: Supervision/Verbal cueing   Wheelchair 150 feet activity     Assist Wheelchair 150 feet activity did not occur: Safety/medical concerns   Assist Level: Supervision/Verbal cueing    Medical Problem List and Plan: 1.Decreased functional mobility with right lower extremity weaknesssecondary to cervical and thoracic myelopathy. Status post decompression T1-2. Continue slow taper of Decadron CIR PT OT -AFO delivered and fitting appropriately 2. DVT Prophylaxis/Anticoagulation: SCDs.  Lower extremity venous Dopplers negative for DVT 3. Pain Management:Robaxin and oxycodone as needed  -back pain appears to be predominantly spastic-better when being proactive with robaxin   -changed frequency to q4 prn 4. Mood:Provide emotional support 5. Neuropsych: This patientiscapable of making decisions on herown behalf. 6. Skin/Wound Care:Routine skin checks 7. Fluids/Electrolytes/Nutrition:eating well   -protein supp for hypoalbuminemia  8. Hypertension. Norvasc 2.5 mg daily. Monitor with increased mobility Vitals:   05/03/18 1925 05/04/18 0336  BP: (!) 141/57 (!) 152/63  Pulse: 79 62  Resp: 18 19  Temp: 98.4 F (36.9 C) 98.2 F (36.8 C)  SpO2: 97% 99%   fair to reasonable control 9. Hyperlipidemia. Zetia 10. Constipation. Laxative assistance, encourage appropriate PO intake 11. Elevated LFT"s---likely reactive  -LFT's improving. Follow up next week 12.  Low-grade temp x1, 99.9 over weekend. Still some frequency  -ua neg, ucx still pending    LOS: 8 days A FACE TO FACE EVALUATION WAS PERFORMED  Ranelle OysterZachary T Swartz 05/04/2018, 8:23 AM

## 2018-05-05 ENCOUNTER — Inpatient Hospital Stay (HOSPITAL_COMMUNITY): Payer: Medicare Other

## 2018-05-05 ENCOUNTER — Inpatient Hospital Stay (HOSPITAL_COMMUNITY): Payer: Medicare Other | Admitting: Physical Therapy

## 2018-05-05 ENCOUNTER — Inpatient Hospital Stay (HOSPITAL_COMMUNITY): Payer: Medicare Other | Admitting: Occupational Therapy

## 2018-05-05 NOTE — Discharge Summary (Signed)
Discharge summary job [id number]

## 2018-05-05 NOTE — Discharge Summary (Signed)
NAME: COSBY, PISCIOTTA MEDICAL RECORD LY:[id number] ACCOUNT [id number] DATE OF BIRTH:Dec 11, 1940 FACILITY: [facility] LOCATION: [facility] PHYSICIAN:ZACHARY SWARTZ, MD  DISCHARGE SUMMARY  DATE OF DISCHARGE:  05/06/2018  DISCHARGE DIAGNOSES: 1.  Right lower extremity weakness secondary to cervical thoracic myelopathy status post decompression T1-T2. 2.  Sequential compression devices for deep venous thrombosis prophylaxis. 3.  Pain management. 4.  Hypertension 5.  Constipation. 6.  Hyperlipidemia.   7.  Mildly elevated liver function tests.   HISTORY OF PRESENT ILLNESS:  This is a 78 year old right-handed female with history of hypertension who lives with spouse, independent with assistive device prior to admission.  Presented 04/22/2018 with progressive right side leg weakness, gait  instability times several weeks.  Noted some mild incontinence of bladder.  X-rays and imaging revealed a thoracic myelopathy, T1-2 spondylolisthesis.  underwent T1-T2 laminectomy, decompression of spinal cord as well as pedicle screw posterior lateral  arthrodesis 04/22/2018 per Dr. Conchita Paris.  HOSPITAL COURSE:  Pain management.  Decadron protocol as indicated.  Therapy evaluations completed and the patient was admitted for comprehensive rehabilitation program.  PAST MEDICAL HISTORY:  See discharge diagnoses.  SOCIAL HISTORY:  Lives with spouse, independent with assistive device prior to admission.  FUNCTIONAL STATUS:  Upon admission to rehab services was minimal assist, stand pivot transfers, min mod assist to ambulate with assistive device, min mod assist with activities of daily living.  PHYSICAL EXAMINATION: VITAL SIGNS:  Blood pressure 123/65, pulse 85, temperature 98, respirations 18. GENERAL:  Alert female in no acute distress. HEENT:  EOMs intact. NECK:  Supple, nontender, no JVD. CARDIOVASCULAR:  Rate controlled. ABDOMEN:  Soft, nontender, good bowel sounds. LUNGS:  Clear to auscultation.  The patient  is alert, following full commands.  REHABILITATION HOSPITAL COURSE:  The patient was admitted to inpatient rehabilitation services.  Therapies initiated on a 3-hour daily basis, consisting of physical therapy, occupational therapy and rehabilitation nursing.  The following issues were  addressed during patient's rehabilitation stay:    Pertaining to the patient's cervical, thoracic myelopathy, she had undergone decompression T1 to Decadron protocol as indicated.  AFO was placed and sitting appropriately.  She would follow up with neurosurgery as well as outpatient Rehabilitation  Services as directed.  SCDs for DVT prophylaxis.  Lower extremity Dopplers negative.    Pain management with the use of Robaxin, oxycodone.  Blood pressures were controlled on Norvasc.    Zetia ongoing for hyperlipidemia.  She had some mild elevated LFTs, likely reactive.  Follow up LFTs  noted.    Bowel program established due to some constipation.    Latest urine study speech multi-species.  The patient is afebrile.  No dysuria.    The patient received weekly collaborative interdisciplinary team conferences to discuss estimated length of stay, family teaching, any barriers to discharge.  Stand pivot transfers to recliner wheelchair, rolling walker, contact guard assist, ambulating  150 feet, rolling walker, contact guard assist for balance was fitted with an AFO brace.  Activities of daily living and homemaking.  Working with energy conservation techniques using her assistive device to ambulate to gather her belongings for  dressing, grooming and hygiene.  Full family teaching was completed and planned discharge to home.  DISCHARGE MEDICATIONS:  Included Norvasc 10 mg daily, Decadron taper as directed.   1.  Zetia 10 mg p.o. daily, Protonix 40 mg p.o. at bedtime, PreserVision 1 tablet p.o. b.i.d., Senokot 1 tablet p.o. b.i.d., Robaxin 500 mg p.o. every 4 hours as needed for muscle spasms, oxycodone 5 mg every  3 hours  as needed for pain.  DIET:  Regular.  She would follow up with Dr. Faith Rogue at the outpatient rehab service office as directed.  Dr. Conchita Paris, call for appointment; Dr. Merlene Laughter medical management.  AN/NUANCE D:05/05/2018 T:05/05/2018 JOB:[id number]

## 2018-05-05 NOTE — Progress Notes (Signed)
[facility] PHYSICAL MEDICINE & REHABILITATION PROGRESS NOTE   Subjective/Complaints: C/o mild right shoulder pain after therapy yesterday.   ROS: Patient denies fever, rash, sore throat, blurred vision, nausea, vomiting, diarrhea, cough, shortness of breath or chest pain,   headache, or mood change.   Objective:   No results found. No results for input(s): WBC, HGB, HCT, PLT in the last 72 hours. No results for input(s): NA, K, CL, CO2, GLUCOSE, BUN, CREATININE, CALCIUM in the last 72 hours.  Intake/Output Summary (Last 24 hours) at 05/05/2018 0811 Last data filed at 05/04/2018 1747 Gross per 24 hour  Intake 720 ml  Output -  Net 720 ml     Physical Exam: Vital Signs Blood pressure (!) 141/67, pulse 66, temperature 99.6 F (37.6 C), temperature source Oral, resp. rate 17, height 5\' 5"  (1.651 m), weight 72.2 kg, SpO2 96 %. Constitutional: No distress . Vital signs reviewed. HEENT: EOMI, oral membranes moist Neck: supple Cardiovascular: RRR without murmur. No JVD    Respiratory: CTA Bilaterally without wheezes or rales. Normal effort    GI: BS +, non-tender, non-distended  Skin: wound cdi Musculoskeletal:mild pain along superior rhomboids Neurological: Patient is alert.  UE 5/5. RLE: HF/KE 3/5, APF 2/5, ADF 1+ to 2/5.  today.  LLE: 3+/5 HF, KE and 4/5 ADF/PF. Still impaired LT/PP RLE and up to umbilicus right more affected than left-- DTR's tr LE's     Assessment/Plan: 1. Functional deficits secondary to thoracic myelopathy which require 3+ hours per day of interdisciplinary therapy in a comprehensive inpatient rehab setting.  Physiatrist is providing close team supervision and 24 hour management of active medical problems listed below.  Physiatrist and rehab team continue to assess barriers to discharge/monitor patient progress toward functional and medical goals  Care Tool:  Bathing    Body parts bathed by patient: Right arm, Left arm, Chest, Front perineal area,  Abdomen, Buttocks, Right upper leg, Left upper leg, Face, Right lower leg, Left lower leg   Body parts bathed by helper: Buttocks     Bathing assist Assist Level: Supervision/Verbal cueing     Upper Body Dressing/Undressing Upper body dressing   What is the patient wearing?: Bra, Pull over shirt    Upper body assist Assist Level: Supervision/Verbal cueing    Lower Body Dressing/Undressing Lower body dressing      What is the patient wearing?: Pants, Underwear/pull up     Lower body assist Assist for lower body dressing: Contact Guard/Touching assist     Toileting Toileting Toileting Activity did not occur (Clothing management and hygiene only): Safety/medical concerns  Toileting assist Assist for toileting: Supervision/Verbal cueing     Transfers Chair/bed transfer  Transfers assist  Chair/bed transfer activity did not occur: Safety/medical concerns  Chair/bed transfer assist level: Contact Guard/Touching assist     Locomotion Ambulation   Ambulation assist   Ambulation activity did not occur: Safety/medical concerns  Assist level: Contact Guard/Touching assist Assistive device: Walker-rolling Max distance: 150'   Walk 10 feet activity   Assist     Assist level: Contact Guard/Touching assist Assistive device: Walker-rolling, Orthosis   Walk 50 feet activity   Assist    Assist level: Contact Guard/Touching assist Assistive device: Orthosis, Walker-rolling    Walk 150 feet activity   Assist Walk 150 feet activity did not occur: Safety/medical concerns  Assist level: Contact Guard/Touching assist Assistive device: Walker-platform, Orthosis    Walk 10 feet on uneven surface  activity   Assist Walk 10 feet on  uneven surfaces activity did not occur: Safety/medical concerns         Wheelchair     Assist Will patient use wheelchair at discharge?: No Type of Wheelchair: Manual    Wheelchair assist level: Supervision/Verbal  cueing Max wheelchair distance: 26100ft     Wheelchair 50 feet with 2 turns activity    Assist        Assist Level: Supervision/Verbal cueing   Wheelchair 150 feet activity     Assist Wheelchair 150 feet activity did not occur: Safety/medical concerns   Assist Level: Supervision/Verbal cueing    Medical Problem List and Plan: 1.Decreased functional mobility with right lower extremity weaknesssecondary to cervical and thoracic myelopathy. Status post decompression T1-2. Continue slow taper of Decadron ELOS 1/10 -AFO delivered and fitting appropriately 2. DVT Prophylaxis/Anticoagulation: SCDs.  Lower extremity venous Dopplers negative for DVT 3. Pain Management:Robaxin and oxycodone as needed  -back pain appears to be predominantly spastic-better when being proactive with robaxin   -changed frequency to q4 prn  -scapular ROM/mob for right trap/rhomboid pain 4. Mood:Provide emotional support 5. Neuropsych: This patientiscapable of making decisions on herown behalf. 6. Skin/Wound Care:Routine skin checks 7. Fluids/Electrolytes/Nutrition:eating well   -protein supp for hypoalbuminemia  8. Hypertension. Norvasc 2.5 mg daily. Monitor with increased mobility Vitals:   05/04/18 1931 05/05/18 0450  BP: (!) 147/64 (!) 141/67  Pulse: 79 66  Resp: 18 17  Temp: 98.2 F (36.8 C) 99.6 F (37.6 C)  SpO2: 98% 96%    reasonable control 9. Hyperlipidemia. Zetia 10. Constipation. Laxative assistance, encourage appropriate PO intake 11. Elevated LFT"s---likely reactive  -LFT's improving. Follow up next week 12.  Low-grade temp again  -ua neg, ucx with multi-species  -urine currently without odor  -wound/exam benign---observe for now    LOS: 9 days A FACE TO FACE EVALUATION WAS PERFORMED  Ranelle OysterZachary T Tysheem Accardo 05/05/2018, 8:11 AM

## 2018-05-05 NOTE — Progress Notes (Signed)
Social Work Discharge Note  The overall goal for the admission was met for:   Discharge location: Yes - home with husband  Length of Stay: Yes - 9 days  Discharge activity level: Yes - supervision  Home/community participation: Yes  Services provided included: MD, RD, PT, OT, RN, Pharmacy and SW  Financial Services: Private Insurance: [employer]  Follow-up services arranged: Outpatient: PT/OT, DME: Rolling walker; bedside commode; tub seat with back and Patient/Family request agency HH: [facility], DME: [facility]  Comments (or additional information):  Pt's husband has been at pt's bedside and has received education on pt's need for supervision.  Pt and husband know there will be things they will just have to figure out at home, but overall, they report feeling very prepared to go home.  CSW remains available as needed.  Patient/Family verbalized understanding of follow-up arrangements: Yes  Individual responsible for coordination of the follow-up plan: pt and her husband  Confirmed correct DME delivered: Trey Sailors 05/05/2018    Jearldine Cassady, Silvestre Mesi

## 2018-05-05 NOTE — Progress Notes (Signed)
Physical Therapy Discharge Summary  Patient Details  Name: Cheryl Pearson MRN: [id number] Date of Birth: 22-Jan-1941  Today's Date: 05/05/2018 PT Individual Time: 1000-1100 PT Individual Time Calculation (min): 60 min    Patient has met 10 of 10 long term goals due to improved activity tolerance, improved balance, improved postural control, increased strength, increased range of motion, ability to compensate for deficits, functional use of  right lower extremity and improved coordination.  Patient to discharge at an ambulatory level Supervision.   Patient's care partner is independent to provide the necessary physical assistance at discharge.  Reasons goals not met: Pt has met all rehab goals.  Recommendation:  Patient will benefit from ongoing skilled PT services in outpatient setting to continue to advance safe functional mobility, address ongoing impairments in RLE strength and coordination, standing balance, and minimize fall risk.  Equipment: RW  Reasons for discharge: treatment goals met and discharge from hospital  Patient/family agrees with progress made and goals achieved: Yes   Skilled Intervention: Pt received seated in recliner in room, agreeable to PT. No complaints of pain. Pt transfers with sit to stand with Supervision throughout therapy session. Pt does require mod A for sit to supine due to BLE weakness and inability to perform log roll technique without assist to lift BLE into bed to adhere to spinal precautions. Pt's husband Liliane Channel demonstrates ability to assist pt with this. Pt is Supervision for all other bed mobility. Ascend/descend 4 stairs with one handrail laterally with Supervision. Car transfer with Supervision. Ambulation x 150 ft with RW and Supervision with use of R GRAFO and toe cap.Pt's husband Liliane Channel demos good understanding and safety with assisting pt at Supervision level. Pt left seated in recliner in room with needs in reach, husband present.  PT  Discharge Precautions/Restrictions Precautions Precautions: Back;Fall Restrictions Weight Bearing Restrictions: No Pain Pain Assessment Pain Scale: 0-10 Pain Score: 0-No pain Vision/Perception  Perception Perception: Within Functional Limits Praxis Praxis: Intact  Cognition Overall Cognitive Status: Within Functional Limits for tasks assessed Arousal/Alertness: Awake/alert Orientation Level: Oriented X4 Attention: Focused Focused Attention: Appears intact Selective Attention: Impaired Memory: Appears intact Awareness: Appears intact Problem Solving: Appears intact Safety/Judgment: Appears intact Sensation Sensation Light Touch: Appears Intact Hot/Cold: Appears Intact Proprioception: Impaired by gross assessment(RLE hallux impaired) Coordination Gross Motor Movements are Fluid and Coordinated: Yes Fine Motor Movements are Fluid and Coordinated: Yes Coordination and Movement Description: improved since eval Heel Shin Test: unable RLE d/t strength deficits Motor  Motor Motor: Other (comment)(RLE weakness) Motor - Discharge Observations: ongoing RLE weakness, improved since eval  Mobility Bed Mobility Bed Mobility: Rolling Right;Rolling Left;Supine to Sit;Sit to Supine Rolling Right: Supervision/verbal cueing Rolling Left: Supervision/Verbal cueing Supine to Sit: Supervision/Verbal cueing Sit to Supine: Moderate Assistance - Patient 50-74% Transfers Transfers: Sit to Stand;Stand Pivot Transfers;Stand to Sit Sit to Stand: Supervision/Verbal cueing Stand to Sit: Supervision/Verbal cueing Stand Pivot Transfers: Supervision/Verbal cueing Stand Pivot Transfer Details: Verbal cues for precautions/safety;Verbal cues for safe use of DME/AE Transfer (Assistive device): Rolling walker Locomotion  Gait Ambulation: Yes Gait Assistance: Supervision/Verbal cueing Gait Distance (Feet): 150 Feet Assistive device: Rolling walker Gait Assistance Details: Verbal cues for  precautions/safety;Verbal cues for safe use of DME/AE;Tactile cues for weight shifting Gait Gait: Yes Gait Pattern: Impaired Gait Pattern: Poor foot clearance - right;Right flexed knee in stance Gait velocity: decreased Stairs / Additional Locomotion Stairs: Yes Stairs Assistance: Supervision/Verbal cueing Stair Management Technique: One rail Right;Sideways;Step to pattern Number of Stairs: 4 Height of Stairs: 6  Wheelchair Mobility Wheelchair Mobility: No  Trunk/Postural Assessment  Cervical Assessment Cervical Assessment: Exceptions to WFL(forward head) Thoracic Assessment Thoracic Assessment: Exceptions to WFL(spinal surgery T1-2) Lumbar Assessment Lumbar Assessment: Within Functional Limits Postural Control Postural Control: Within Functional Limits  Balance Balance Balance Assessed: Yes Static Sitting Balance Static Sitting - Balance Support: Feet supported Static Sitting - Level of Assistance: 6: Modified independent (Device/Increase time) Dynamic Sitting Balance Dynamic Sitting - Balance Support: During functional activity Dynamic Sitting - Level of Assistance: 5: Stand by assistance Static Standing Balance Static Standing - Balance Support: During functional activity Static Standing - Level of Assistance: 5: Stand by assistance Dynamic Standing Balance Dynamic Standing - Balance Support: During functional activity Dynamic Standing - Level of Assistance: 5: Stand by assistance Extremity Assessment  RUE Assessment RUE Assessment: Within Functional Limits LUE Assessment LUE Assessment: Within Functional Limits RLE Strength RLE Overall Strength: Deficits Right Hip Flexion: 2-/5 Right Hip Extension: 3-/5 Right Knee Flexion: 2/5 Right Knee Extension: 4/5 Right Ankle Dorsiflexion: 3-/5 Right Ankle Plantar Flexion: 3/5 LLE Assessment LLE Assessment: Exceptions to [facility] General Strength Comments: grossly 4+/5, knee flex 4-/5     Excell Seltzer, PT, DPT 05/05/2018,  3:57 PM

## 2018-05-05 NOTE — Progress Notes (Signed)
Occupational Therapy Discharge Summary  Patient Details  Name: Cheryl Pearson MRN: [id number] Date of Birth: 1941/02/28  Today's Date: 05/05/2018 OT Individual Time: 4734-0370 OT Individual Time Calculation (min): 75 min    Patient has met 7 of 7 long term goals due to improved activity tolerance, improved balance, postural control, ability to compensate for deficits, improved awareness and improved coordination.  Patient to discharge at overall Supervision level.  Patient's care partner is independent to provide the necessary supervision assistance at discharge.    Reasons goals not met: all goals met  Recommendation:  Patient will benefit from ongoing skilled OT services in outpatient setting to continue to advance functional skills in the area of BADL and iADL.  Equipment: BSC and shower chair  Reasons for discharge: treatment goals met  Patient/family agrees with progress made and goals achieved: Yes   OT Intervention: Upon entering the room, pt seated in recliner chair with husband present in room. Husband present for continued hands on family education. Pt requesting to shower this session. Pt ambulating with supervision from husband into bathroom and he providing min cuing for safety awareness with toileting needs, doffing clothing, and transfer onto shower seat. Pt bathing at overall supervision level with use of LH sponge to increase I with self care. Pt ambulating to sit in recliner chair to don clothing items with increased time, supervision , and cuing for technique with use of shoe funnel, sock aide, and reacher. Pt was able to don B socks, AFO , and shoes with use of figure four position and AE. Pt remained seated in recliner chair at end of session with call bell and all needed items within reach upon exiting the room.   OT Discharge Precautions/Restrictions  Precautions Precautions: Back;Fall Restrictions Weight Bearing Restrictions: No  Pain Pain Assessment Pain  Scale: 0-10 Pain Score: 0-No pain ADL ADL Equipment Provided: Reacher, Sock aid Eating: Set up Where Assessed-Eating: Edge of bed Grooming: Supervision/safety Where Assessed-Grooming: Edge of bed Upper Body Bathing: Supervision/safety Where Assessed-Upper Body Bathing: Shower Lower Body Bathing: Minimal assistance, Minimal cueing Where Assessed-Lower Body Bathing: Shower Upper Body Dressing: Supervision/safety Where Assessed-Upper Body Dressing: Edge of bed Lower Body Dressing: Minimal assistance Where Assessed-Lower Body Dressing: Edge of bed Toileting: Minimal cueing, Minimal assistance Where Assessed-Toileting: Bedside Commode Toilet Transfer: Minimal assistance, Minimal verbal cueing Toilet Transfer Method: Counselling psychologist: Bedside commode, Grab bars Social research officer, government: Minimal cueing, Minimal assistance Social research officer, government Method: Heritage manager: Radio broadcast assistant Vision Baseline Vision/History: Wears glasses Wears Glasses: At all times Patient Visual Report: No change from baseline Cognition Overall Cognitive Status: Within Functional Limits for tasks assessed Arousal/Alertness: Awake/alert Orientation Level: Oriented X4 Sensation Sensation Light Touch: Appears Intact Hot/Cold: Appears Intact Proprioception: Impaired by gross assessment Coordination Gross Motor Movements are Fluid and Coordinated: Yes Fine Motor Movements are Fluid and Coordinated: Yes Coordination and Movement Description: improved since eval Heel Shin Test: unable RLE d/t strength deficits Motor  Motor Motor: Other (comment) Motor - Discharge Observations: ongoing RLE weakness, improved since eval Mobility  Bed Mobility Bed Mobility: Rolling Right;Rolling Left;Supine to Sit;Sit to Supine Rolling Right: Supervision/verbal cueing Rolling Left: Supervision/Verbal cueing Supine to Sit: Supervision/Verbal cueing Transfers Sit to Stand:  Supervision/Verbal cueing Stand to Sit: Supervision/Verbal cueing  Trunk/Postural Assessment  Cervical Assessment Cervical Assessment: Exceptions to WFL(forward head) Thoracic Assessment Thoracic Assessment: Exceptions to WFL(back prec) Lumbar Assessment Lumbar Assessment: Within Functional Limits Postural Control Postural Control: Within Functional Limits  Balance Balance Balance  Assessed: Yes Static Sitting Balance Static Sitting - Balance Support: Feet supported Static Sitting - Level of Assistance: 6: Modified independent (Device/Increase time) Dynamic Sitting Balance Dynamic Sitting - Balance Support: During functional activity Dynamic Sitting - Level of Assistance: 5: Stand by assistance Static Standing Balance Static Standing - Balance Support: During functional activity Static Standing - Level of Assistance: 5: Stand by assistance Dynamic Standing Balance Dynamic Standing - Balance Support: During functional activity Dynamic Standing - Level of Assistance: 5: Stand by assistance Extremity/Trunk Assessment RUE Assessment RUE Assessment: Within Functional Limits LUE Assessment LUE Assessment: Within Functional Limits   Gypsy Decant 05/05/2018, 12:26 PM

## 2018-05-05 NOTE — Discharge Instructions (Signed)
Inpatient Rehab Discharge Instructions  Cheryl Pearson Discharge date and time: No discharge date for patient encounter.   Activities/Precautions/ Functional Status: Activity: activity as tolerated Diet: regular diet Wound Care: keep wound clean and dry Functional status:  ___ No restrictions     ___ Walk up steps independently ___ 24/7 supervision/assistance   ___ Walk up steps with assistance ___ Intermittent supervision/assistance  ___ Bathe/dress independently ___ Walk with walker     _x__ Bathe/dress with assistance ___ Walk Independently    ___ Shower independently ___ Walk with assistance    ___ Shower with assistance ___ No alcohol     ___ Return to work/school ________  COMMUNITY REFERRALS UPON DISCHARGE:   Outpatient: PT     OT  Agency:  [facility] Phone:  [phone]  Appointment Date/Time:  Tuesday, January 14 at 3:30 PM for Physical Therapy (arrive at 3:15 PM)                                                      Monday, January 20 at 2:45 PM for Occupational Therapy  Medical Equipment/Items Ordered:  Rolling walker; bedside commode; shower seat with back  Agency/Supplier:  [facility]        Phone:  [phone]  Special Instructions: No driving   My questions have been answered and I understand these instructions. I will adhere to these goals and the provided educational materials after my discharge from the hospital.  Patient/Caregiver Signature _______________________________ Date __________  Clinician Signature _______________________________________ Date __________  Please bring this form and your medication list with you to all your follow-up doctor's appointments.

## 2018-05-05 NOTE — Progress Notes (Signed)
Occupational Therapy Session Note  Patient Details  Name: Cheryl Pearson MRN: [id number] Date of Birth: [birth year]  Today's Date: 05/05/2018 OT Individual Time: 1300-1400 OT Individual Time Calculation (min): 60 min    Short Term Goals: Week 1:  OT Short Term Goal 1 (Week 1): Pt will sequence through walk in shower transfer with no vc OT Short Term Goal 2 (Week 1): Pt will complete LB dressing with LRAD with CGA OT Short Term Goal 3 (Week 1): Pt will complete functional mobility to toilet with CGA and LRAD  Skilled Therapeutic Interventions/Progress Updates:    OT intervention with focus on bed mobility, functional amb with RW for simple home mgmt tasks, standing balance, sit<>stand, activity tolerance, and safety awareness to increase independence with BADLs. Pt amb with RW in ADL apartment to gather towels from floor using reacher.  Focus on following back precautions and safety awareness.  Pt stood at table to fold towels with focus on BUE use and standing balance without BUE support.  Pt also practiced getting into/out of bed.  Pt required to utilizing side stepping techniques in closed envirionment.  Pt returned to room and remained in recliner with all needs within reach.   Therapy Documentation Precautions:  Precautions Precautions: Back, Fall Required Braces or Orthoses: (no brace) Restrictions Weight Bearing Restrictions: No  Pain: Pt denies pain  Therapy/Group: Individual Therapy  Rich Brave 05/05/2018, 2:51 PM

## 2018-05-06 MED ORDER — METHOCARBAMOL 500 MG PO TABS: 500.0000 mg | ORAL_TABLET | ORAL | 0 refills | Status: DC | PRN

## 2018-05-06 MED ORDER — DEXAMETHASONE 2 MG PO TABS: 2.0000 mg | ORAL_TABLET | Freq: Two times a day (BID) | ORAL | Status: DC

## 2018-05-06 MED ORDER — SENNA 8.6 MG PO TABS: 1.0000 | ORAL_TABLET | Freq: Two times a day (BID) | ORAL | 0 refills | Status: DC

## 2018-05-06 MED ORDER — EZETIMIBE 10 MG PO TABS: 10.0000 mg | ORAL_TABLET | Freq: Every day | ORAL | 0 refills | Status: AC

## 2018-05-06 MED ORDER — AMLODIPINE BESYLATE 2.5 MG PO TABS: 2.5000 mg | ORAL_TABLET | Freq: Every day | ORAL | 0 refills | Status: AC

## 2018-05-06 MED ORDER — PANTOPRAZOLE SODIUM 40 MG PO TBEC: 40.0000 mg | DELAYED_RELEASE_TABLET | Freq: Every day | ORAL | 0 refills | Status: DC

## 2018-05-06 MED ORDER — ACETAMINOPHEN 325 MG PO TABS: 650.0000 mg | ORAL_TABLET | ORAL | Status: AC | PRN

## 2018-05-06 MED ORDER — DEXAMETHASONE 2 MG PO TABS: ORAL_TABLET | ORAL | 0 refills | Status: DC

## 2018-05-06 MED ORDER — DEXAMETHASONE 4 MG PO TABS: ORAL_TABLET | ORAL | 0 refills | Status: DC

## 2018-05-06 MED ORDER — OXYCODONE HCL 5 MG PO TABS: 5.0000 mg | ORAL_TABLET | ORAL | 0 refills | Status: DC | PRN

## 2018-05-06 NOTE — Progress Notes (Signed)
Patient discharge with husband. Instructions given to husband during shift.  All questions answered during discharge.

## 2018-05-06 NOTE — Plan of Care (Signed)
  Problem: Consults Goal: RH SPINAL CORD INJURY PATIENT EDUCATION Description  See Patient Education module for education specifics.  Outcome: Completed/Met   Problem: RH SKIN INTEGRITY Goal: RH STG ABLE TO PERFORM INCISION/WOUND CARE W/ASSISTANCE Description STG Able To Perform Incision/Wound Care With min Assistance.  Outcome: Completed/Met   Problem: RH SAFETY Goal: RH STG ADHERE TO SAFETY PRECAUTIONS W/ASSISTANCE/DEVICE Description STG Adhere to Safety Precautions With mod I Assistance/Device.  Outcome: Completed/Met   Problem: RH PAIN MANAGEMENT Goal: RH STG PAIN MANAGED AT OR BELOW PT'S PAIN GOAL Description Less than 3 out of 10  Outcome: Completed/Met   Problem: RH KNOWLEDGE DEFICIT SCI Goal: RH STG INCREASE KNOWLEDGE OF SELF CARE AFTER SCI Outcome: Completed/Met

## 2018-05-06 NOTE — Progress Notes (Signed)
[facility] PHYSICAL MEDICINE & REHABILITATION PROGRESS NOTE   Subjective/Complaints: Excited about getting home. Very pleased with progress  ROS: Patient denies fever, rash, sore throat, blurred vision, nausea, vomiting, diarrhea, cough, shortness of breath or chest pain, joint or back pain, headache, or mood change.   Objective:   No results found. No results for input(s): WBC, HGB, HCT, PLT in the last 72 hours. No results for input(s): NA, K, CL, CO2, GLUCOSE, BUN, CREATININE, CALCIUM in the last 72 hours.  Intake/Output Summary (Last 24 hours) at 05/06/2018 0917 Last data filed at 05/05/2018 1700 Gross per 24 hour  Intake 240 ml  Output -  Net 240 ml     Physical Exam: Vital Signs Blood pressure (!) 163/65, pulse 64, temperature 98.3 F (36.8 C), temperature source Oral, resp. rate 18, height 5\' 5"  (1.651 m), weight 72.2 kg, SpO2 94 %. Constitutional: No distress . Vital signs reviewed. HEENT: EOMI, oral membranes moist Neck: supple Cardiovascular: RRR without murmur. No JVD    Respiratory: CTA Bilaterally without wheezes or rales. Normal effort    GI: BS +, non-tender, non-distended  Skin: wound cdi Musculoskeletal:mild pain along superior rhomboids Neurological: Patient is alert.  UE 5/5. RLE: HF/KE 3/5, APF 2/5, ADF 1+ to 2/5.  today.  LLE: 3+/5 HF, KE and 4/5 ADF/PF. Decreased LT/PP RLE and up to umbilicus right more affected than left-- DTR's tr LE's     Assessment/Plan: 1. Functional deficits secondary to thoracic myelopathy which require 3+ hours per day of interdisciplinary therapy in a comprehensive inpatient rehab setting.  Physiatrist is providing close team supervision and 24 hour management of active medical problems listed below.  Physiatrist and rehab team continue to assess barriers to discharge/monitor patient progress toward functional and medical goals  Care Tool:  Bathing    Body parts bathed by patient: Right arm, Left arm, Chest, Front  perineal area, Abdomen, Buttocks, Right upper leg, Left upper leg, Face, Right lower leg, Left lower leg   Body parts bathed by helper: Buttocks     Bathing assist Assist Level: Supervision/Verbal cueing     Upper Body Dressing/Undressing Upper body dressing   What is the patient wearing?: Bra, Pull over shirt    Upper body assist Assist Level: Supervision/Verbal cueing    Lower Body Dressing/Undressing Lower body dressing      What is the patient wearing?: Pants, Underwear/pull up     Lower body assist Assist for lower body dressing: Supervision/Verbal cueing     Toileting Toileting Toileting Activity did not occur (Clothing management and hygiene only): Safety/medical concerns  Toileting assist Assist for toileting: Supervision/Verbal cueing     Transfers Chair/bed transfer  Transfers assist  Chair/bed transfer activity did not occur: Safety/medical concerns  Chair/bed transfer assist level: Supervision/Verbal cueing     Locomotion Ambulation   Ambulation assist   Ambulation activity did not occur: Safety/medical concerns  Assist level: Supervision/Verbal cueing Assistive device: Walker-rolling Max distance: 150'   Walk 10 feet activity   Assist     Assist level: Supervision/Verbal cueing Assistive device: Walker-rolling, Orthosis   Walk 50 feet activity   Assist    Assist level: Supervision/Verbal cueing Assistive device: Orthosis, Walker-rolling    Walk 150 feet activity   Assist Walk 150 feet activity did not occur: Safety/medical concerns  Assist level: Supervision/Verbal cueing Assistive device: Walker-rolling, Orthosis    Walk 10 feet on uneven surface  activity   Assist Walk 10 feet on uneven surfaces activity did not occur:  Safety/medical concerns   Assist level: Supervision/Verbal cueing Assistive device: Walker-rolling, Orthosis   Wheelchair     Assist Will patient use wheelchair at discharge?: No Type of  Wheelchair: Manual    Wheelchair assist level: Supervision/Verbal cueing Max wheelchair distance: 29000ft     Wheelchair 50 feet with 2 turns activity    Assist        Assist Level: Supervision/Verbal cueing   Wheelchair 150 feet activity     Assist Wheelchair 150 feet activity did not occur: Safety/medical concerns   Assist Level: Supervision/Verbal cueing    Medical Problem List and Plan: 1.Decreased functional mobility with right lower extremity weaknesssecondary to cervical and thoracic myelopathy. Status post decompression T1-2. Continue slow taper of Decadron  ELOS 1/10  -Patient to see Rehab MD/provider in the office for transitional care encounter in 1-2 weeks.  -AFO delivered and fitting appropriately  -wean off decadron 2. DVT Prophylaxis/Anticoagulation: SCDs.  Lower extremity venous Dopplers negative for DVT 3. Pain Management:Robaxin and oxycodone as needed  -back pain appears to be predominantly spastic-better when being proactive with robaxin   -changed frequency to q4 prn  -scapular ROM/mob for right trap/rhomboid pain 4. Mood:Provide emotional support 5. Neuropsych: This patientiscapable of making decisions on herown behalf. 6. Skin/Wound Care:Routine skin checks 7. Fluids/Electrolytes/Nutrition:eating well   -protein supp for hypoalbuminemia  8. Hypertension. Norvasc 2.5 mg daily. Monitor with increased mobility Vitals:   05/05/18 1915 05/06/18 0534  BP: (!) 145/57 (!) 163/65  Pulse: 76 64  Resp: 18 18  Temp: 98.3 F (36.8 C)   SpO2: 97% 94%    reasonable control 1`/10 9. Hyperlipidemia. Zetia 10. Constipation. Laxative assistance, encourage appropriate PO intake 11. Elevated LFT"s---likely reactive  -LFT's improving. Follow up next week 12.  Low-grade temp resolved:  -ua neg, ucx with multi-species  -urine currently without odor  -wound/exam benign     LOS: 10 days A FACE TO FACE EVALUATION WAS  PERFORMED  Ranelle OysterZachary T Swartz 05/06/2018, 9:17 AM

## 2018-05-10 ENCOUNTER — Ambulatory Visit: Payer: Medicare Other | Attending: Physical Medicine & Rehabilitation | Admitting: Physical Therapy

## 2018-05-10 ENCOUNTER — Other Ambulatory Visit: Payer: Self-pay

## 2018-05-10 ENCOUNTER — Telehealth: Payer: Self-pay

## 2018-05-10 ENCOUNTER — Ambulatory Visit: Payer: Medicare Other | Admitting: Occupational Therapy

## 2018-05-10 ENCOUNTER — Ambulatory Visit: Payer: Medicare Other | Admitting: Physical Therapy

## 2018-05-10 ENCOUNTER — Encounter: Payer: Self-pay | Admitting: Physical Therapy

## 2018-05-10 DIAGNOSIS — M25511 Pain in right shoulder: Secondary | ICD-10-CM | POA: Insufficient documentation

## 2018-05-10 DIAGNOSIS — R2681 Unsteadiness on feet: Secondary | ICD-10-CM | POA: Diagnosis present

## 2018-05-10 DIAGNOSIS — R2689 Other abnormalities of gait and mobility: Secondary | ICD-10-CM | POA: Insufficient documentation

## 2018-05-10 DIAGNOSIS — R261 Paralytic gait: Secondary | ICD-10-CM | POA: Diagnosis present

## 2018-05-10 DIAGNOSIS — M6281 Muscle weakness (generalized): Secondary | ICD-10-CM | POA: Insufficient documentation

## 2018-05-10 NOTE — Therapy (Signed)
[facility] [address] [place], [place], [address] Phone: [phone]   Fax:  [phone]  Occupational Therapy Evaluation  Patient Details  Name: Cheryl Pearson MRN: [id number] Date of Birth: 1940/05/15 No data recorded  Encounter Date: 05/10/2018  OT End of Session - 05/10/18 1510    Visit Number  1    Number of Visits  25    Date for OT Re-Evaluation  08/08/18    Authorization Type  [place] [employer]--awaiting insurance verification    Authorization - Visit Number  1    Authorization - Number of Visits  10    OT Start Time  1410    OT Stop Time  1450    OT Time Calculation (min)  40 min    Activity Tolerance  Patient tolerated treatment well    Behavior During Therapy  [facility] for tasks assessed/performed       Past Medical History:  Diagnosis Date  . Anxiety   . Arthritis   . Colitis   . Early age-related macular degeneration   . Hernia   . Hypertension   . Infertility, female   . Menopausal symptoms   . Rosacea   . Thyroid disease 1968   hypothyroidism treated with meds for short time then all labs normal    Past Surgical History:  Procedure Laterality Date  . bone spur Left   . COLONOSCOPY  11/2011   polyp, ulcerative coliits inactive  . COLONOSCOPY  2005  . COLONOSCOPY  1970's   diagnosed with ulcerative colitis  . ESOPHAGOGASTRODUODENOSCOPY (EGD) WITH PROPOFOL N/A 11/20/2014   Procedure: ESOPHAGOGASTRODUODENOSCOPY (EGD) WITH PROPOFOL;  Surgeon: Carman Ching, MD;  Location: [facility] ENDOSCOPY;  Service: Endoscopy;  Laterality: N/A;  . INGUINAL HERNIA REPAIR Right 30 mos old  . LAPAROSCOPIC CHOLECYSTECTOMY  09/1998  . SAVORY DILATION N/A 11/20/2014   Procedure: SAVORY DILATION;  Surgeon: Carman Ching, MD;  Location: [facility] ENDOSCOPY;  Service: Endoscopy;  Laterality: N/A;  . WISDOM TOOTH EXTRACTION  1968    There were no vitals filed for this visit.  Subjective Assessment - 05/10/18 1417     Subjective   "It's getting better everyday"  Mornings are better once I get started, but it varies a lot during the day.    Patient is accompained by:  Family member   husband   Pertinent History  cervical throracic myelopathy s/p decompression T1-T2 (surgery 04/22/18); PMH:  anxiety, arthritis, colitis, Macular degeneration    Limitations  back precautions (no bending, lifting, twisting), fall risk    Patient Stated Goals  return to driving, volunteering/helping friends, be able to travel and go out to eat    Currently in Pain?  Yes    Pain Score  0-No pain   0-8/10   Pain Location  Shoulder    Pain Orientation  Right    Pain Descriptors / Indicators  Sharp    Pain Type  Acute pain    Pain Onset  1 to 4 weeks ago    Pain Frequency  Intermittent    Aggravating Factors   moving forward, lifting things     Pain Relieving Factors  relax, medication, heating pad, sitting good posture        [facility] OT Assessment - 05/10/18 1419      Assessment   Medical Diagnosis  Thoracic Myelopathy    Onset Date/Surgical Date  04/22/18    Hand Dominance  Right    Prior Therapy  inpatient  rehab       Precautions   Precautions  Back;Fall    Precaution Comments  No lifting over gallon of milk      Restrictions   Weight Bearing Restrictions  No      Balance Screen   Has the patient fallen in the past 6 months  No      Home  Environment   Family/patient expects to be discharged to:  Private residence    Home Access  Stairs   2 steps, handrail   Home Layout  One level    Lives With  Spouse      Prior Function   Level of Independence  Independent;Independent with community mobility without device    Vocation  Retired    Leisure  volunteering, gym, eating out, traveling       ADL   Eating/Feeding  --   mod I   Grooming  --   mod I   Upper Body Bathing  Modified independent    Lower Body Bathing  Modified independent    Upper Body Dressing  --   mod I    Lower Body Dressing  Modified  independent    Toilet Transfer  Modified independent   BSC over toliet   Toileting - Clothing Manipulation  Modified independent    Toileting -  Holiday representativeHygiene  Modified Independent    Tub/Shower Transfer  Modified independent    Tub/Shower Transfer Equipment  Shower seat with back   shower stall, long handled sponge     IADL   Prior Level of Function Shopping  independent    Shopping  Needs to be accompanied on any shopping trip    Prior Level of Function Light Housekeeping  independent     Light Housekeeping  --   not performing   Prior Level of Function Meal Prep  independent    Meal Prep  --   not performing   Prior Level of Function Community Mobility  independent    Community Mobility  Relies on family or friends for transportation      Mobility   Mobility Status Comments  Pt ambulates with with RW.  Doing limited furniture walking.   see PT eval for details     Activity Tolerance   Activity Tolerance Comments  Pt reports that she can stand for 3-3015min before sitting, dependent on time of day (morning is better)      Cognition   Overall Cognitive Status  Within Functional Limits for tasks assessed      Sensation   Additional Comments  Pt reports numbness in feet, decr proprioception   RLE>LUE     ROM / Strength   AROM / PROM / Strength  AROM;Strength      AROM   Overall AROM   Within functional limits for tasks performed      Strength   Overall Strength  Unable to assess;Due to precautions   BUEs proximal stenght   Overall Strength Comments  finger abduction 4/5 bilaterally      Hand Function   Right Hand Grip (lbs)  48    Left Hand Grip (lbs)  49                      OT Education - 05/10/18 1508    Education Details  OT eval results/POC; Role of OT    Person(s) Educated  Patient;Spouse    Methods  Explanation    Comprehension  Verbalized understanding  OT Short Term Goals - 05/10/18 1522      OT SHORT TERM GOAL #1   Title  Pt will be  independent with initial HEP.--check STG 06/21/18    Time  6    Period  Weeks    Status  New      OT SHORT TERM GOAL #2   Title  Pt will perform simple cooking task mod I observing back precautions.    Time  6    Period  Weeks    Status  New      OT SHORT TERM GOAL #3   Title  Pt will perform simple home maintenance task mod I observing back precautions.    Time  6    Period  Weeks    Status  New      OT SHORT TERM GOAL #4   Title  Pt will verbalize understanding of energy conservation strategies for ADLs/IADLs.    Time  6    Period  Weeks    Status  New      OT SHORT TERM GOAL #5   Title  Pt will report R shoulder pain less than or equal to 4/10 for ADLs.    Time  6    Period  Weeks    Status  New      Additional Short Term Goals   Additional Short Term Goals  Yes      OT SHORT TERM GOAL #6   Title  Pt will improve R dominant hand grip strength by at least 5lbs to assist with IADLs.    Time  6    Period  Weeks    Status  New        OT Long Term Goals - 05/10/18 1526      OT LONG TERM GOAL #1   Title  Pt will be independent with UE strengthening HEP.--check LTGs 08/08/18    Time  12    Period  Weeks    Status  New      OT LONG TERM GOAL #2   Title  Pt will perform mod complex cooking/cleaning tasks mod I.    Time  12    Period  Weeks    Status  New      OT LONG TERM GOAL #3   Title  Pt will be able to stand to perform IADL/leisure activity for at least 40 min consistently without rest break.    Time  12    Period  Weeks    Status  New      OT LONG TERM GOAL #4   Title  Pt will report R shoulder pain consistently less than or equal to 3/10 for ADLs/IADLs.    Time  12    Period  Weeks    Status  New            Plan - 05/10/18 1512    Clinical Impression Statement  Pt is a 78 y.o. female referred to occupational therapy for thoracic myelopathy s/p decompression T1-T2 (surgery 04/22/18 with hospitalization through 05/06/18).  Pt with PMH that  includes:  anxiety, arthritis, colitis, Macular degeneration.  Pt presents with decr strength, decr balance/functional mobility, and decr activity tolerance for ADLs as well as back precautions.  Pt would benefit from occupational therapy to address these deficits for improved ADL/IADL performance/independence and safety.      Occupational Profile and client history currently impacting functional performance  Pt was independent prior to symptoms beginning  mid November.  Pt currently unable to drive or do community/leisure activities independently and is not performing household IADLs.      Occupational performance deficits (Please refer to evaluation for details):  ADL's;IADL's;Leisure;Social Participation    Rehab Potential  Good    OT Frequency  2x / week    OT Duration  12 weeks   +eval; however, depending on progress, may decr frequency after 4 weeks until cleared from back precautions   OT Treatment/Interventions  Self-care/ADL training;Electrical Stimulation;Therapeutic exercise;Patient/family education;Neuromuscular education;Moist Heat;Aquatic Therapy;Energy conservation;Building services engineer;Therapeutic activities;Balance training;Manual Therapy;DME and/or AE instruction;Passive range of motion;Ultrasound;Cryotherapy;Contrast Bath    Plan  initiate HEP (?cane HEP), standing tolerance    Clinical Decision Making  Limited treatment options, no task modification necessary    Recommended Other Services  current with PT    Consulted and Agree with Plan of Care  Patient       Patient will benefit from skilled therapeutic intervention in order to improve the following deficits and impairments:  Decreased knowledge of use of DME, Pain, Decreased coordination, Decreased mobility, Impaired sensation, Decreased activity tolerance, Decreased endurance, Decreased range of motion, Decreased strength, Improper spinal/pelvic alignment, Decreased balance, Impaired UE functional use  Visit  Diagnosis: Muscle weakness (generalized)  Unsteadiness on feet  Other abnormalities of gait and mobility    Problem List Patient Active Problem List   Diagnosis Date Noted  . Essential hypertension   . Cervical spondylosis without myelopathy   . Steroid-induced hyperglycemia   . Postoperative pain   . Hyponatremia   . Leucocytosis   . Thoracic myelopathy 04/22/2018  . ALLERGIC RHINITIS 01/19/2007    [facility] 05/10/2018, 3:37 PM  [facility] [address] [place], [place], [address] Phone: [phone]   Fax:  [phone]  Name: TZIREL MADIA MRN: [id number] Date of Birth: Apr 29, 1940   Willa Frater, OTR/L [facility] [address] [place], [place]  [address] [phone] phone [phone] 05/10/18 3:37 PM

## 2018-05-10 NOTE — Telephone Encounter (Signed)
Transitional Care call-who you talked with    1. Are you/is patient experiencing any problems since coming home? No Are there any questions regarding any aspect of care?No 2. Are there any questions regarding medications administration/dosing? No Are meds being taken as prescribed? Yes Patient should review meds with caller to confirm 3. Have there been any falls? No  4. Has Home Health been to the house and/or have they contacted you? Outpatient If not, have you tried to contact them? Can we help you contact them? 5. Are bowels and bladder emptying properly? Yes Are there any unexpected incontinence issues? NO If applicable, is patient following bowel/bladder programs? 6. Any fevers, problems with breathing, unexpected pain? No 7. Are there any skin problems or new areas of breakdown? No 8. Has the patient/family member arranged specialty MD follow up (ie cardiology/neurology/renal/surgical/etc)? Yes  Can we help arrange? 9. Does the patient need any other services or support that we can help arrange? No Are caregivers following through as expected in assisting the patient? 10. Has the patient quit smoking, drinking alcohol, or using drugs as recommended? Yes  Appointment time, arrive time 1:20 for 1:40 with Riley Lam on 05/20/2018 [address] [employer] [address]

## 2018-05-11 NOTE — Therapy (Signed)
[facility] [address] [place], [place], [address] Phone: [phone]   Fax:  [phone]  Physical Therapy Evaluation  Patient Details  Name: Cheryl LaineLila F Pearson MRN: [id number] Date of Birth: 12/21/1940 Referring Provider (PT): Faith RogueZachary Swartz, MD   Encounter Date: 05/10/2018  PT End of Session - 05/10/18 1800    Visit Number  1    Number of Visits  25    Date for PT Re-Evaluation  07/08/18    Authorization Type  [employer] Medicare    PT Start Time  1315    PT Stop Time  1400    PT Time Calculation (min)  45 min    Equipment Utilized During Treatment  Gait belt    Activity Tolerance  Patient tolerated treatment well    Behavior During Therapy  [facility] for tasks assessed/performed       Past Medical History:  Diagnosis Date  . Anxiety   . Arthritis   . Colitis   . Early age-related macular degeneration   . Hernia   . Hypertension   . Infertility, female   . Menopausal symptoms   . Rosacea   . Thyroid disease 1968   hypothyroidism treated with meds for short time then all labs normal    Past Surgical History:  Procedure Laterality Date  . bone spur Left   . COLONOSCOPY  11/2011   polyp, ulcerative coliits inactive  . COLONOSCOPY  2005  . COLONOSCOPY  1970's   diagnosed with ulcerative colitis  . ESOPHAGOGASTRODUODENOSCOPY (EGD) WITH PROPOFOL N/A 11/20/2014   Procedure: ESOPHAGOGASTRODUODENOSCOPY (EGD) WITH PROPOFOL;  Surgeon: Carman ChingJames Edwards, MD;  Location: [facility];  Service: Endoscopy;  Laterality: N/A;  . INGUINAL HERNIA REPAIR Right 3418 mos old  . LAPAROSCOPIC CHOLECYSTECTOMY  09/1998  . SAVORY DILATION N/A 11/20/2014   Procedure: SAVORY DILATION;  Surgeon: Carman ChingJames Edwards, MD;  Location: [facility];  Service: Endoscopy;  Laterality: N/A;  . WISDOM TOOTH EXTRACTION  1968    There were no vitals filed for this visit.   Subjective Assessment - 05/10/18 1320    Subjective  This 78yo female was referred for  Outpatient Neurorehab PT & OT on 05/06/2018 by Faith RogueZachary Swartz, MD for thoracic myelopathy. She developed right leg weakness with difficulty walking & falls. She was diagnosed with spinal stenosis and underwent neurosurgery on 12/272/2019 for T1-2 laminectomy for decompression of spinal cord.     Patient is accompained by:  Family member   husband, Cheryl BilesRick Pearson   Pertinent History  Thoracic myelopathy, T1-2 laminectomy, arthritis, HTN,     Limitations  Lifting;Standing;Walking;House hold activities    Patient Stated Goals  To return to as much activity as possible and get return to muscles.     Currently in Pain?  Yes    Pain Score  8    lowest 0/10, highest 8/10   Pain Location  Shoulder    Pain Orientation  Right;Posterior;Upper    Pain Descriptors / Indicators  Sharp;Heaviness    Pain Type  Acute pain    Pain Onset  1 to 4 weeks ago    Pain Frequency  Intermittent    Aggravating Factors   tension, moving forward, lifting things    Pain Relieving Factors  relax, medication, heating pad, sitting good posture         [facility] PT Assessment - 05/10/18 1315      Assessment   Medical Diagnosis  Thoracic Myelopathy    Referring Provider (PT)  Earna CoderZachary  Riley KillSwartz, MD    Onset Date/Surgical Date  04/22/18    Hand Dominance  Right    Prior Therapy  inpatient rehab       Precautions   Precautions  Back;Fall    Precaution Comments  No lifting over gallon of milk      Restrictions   Weight Bearing Restrictions  No      Balance Screen   Has the patient fallen in the past 6 months  No    Has the patient had a decrease in activity level because of a fear of falling?   Yes    Is the patient reluctant to leave their home because of a fear of falling?   Yes      Home Environment   Living Environment  Private residence    Living Arrangements  Spouse/significant other    Type of Home  House    Home Access  Stairs to enter    Entrance Stairs-Number of Steps  2    Entrance Stairs-Rails  Left    Home  Layout  One level   4" step into mud room   Home Equipment  Walker - 2 wheels;Cane - single point;Toilet riser;Shower seat      Prior Function   Level of Independence  Independent;Independent with household mobility without device;Independent with community mobility without device    Vocation  Retired    Leisure  volunteering,  gym, eating out, traveling       Posture/Postural Control   Posture/Postural Control  Postural limitations    Postural Limitations  Rounded Shoulders;Forward head;Flexed trunk;Weight shift left      Strength   Overall Strength  Deficits    Strength Assessment Site  Hip;Knee;Ankle    Right/Left Hip  Right    Right Hip Flexion  2-/5    Right Hip Extension  2/5    Right Hip ABduction  2-/5    Right Hip ADduction  2-/5    Right Knee Flexion  2/5    Right Knee Extension  3/5    Right Ankle Dorsiflexion  3/5    Right Ankle Plantar Flexion  2+/5      Bed Mobility   Bed Mobility  Rolling Right;Rolling Left;Left Sidelying to Sit;Sit to Sidelying Left    Rolling Right  Independent with assistive device    Rolling Left  Independent with assistive device    Left Sidelying to Sit  Independent with assistive device    Sit to Sidelying Left  Independent with assistive device      Transfers   Transfers  Sit to Stand;Stand to Sit    Sit to Stand  5: Supervision;With upper extremity assist;With armrests;From chair/3-in-1   to RW for stabilization   Stand to Sit  5: Supervision;With upper extremity assist;With armrests;To chair/3-in-1   from RW for stability     Ambulation/Gait   Ambulation/Gait  Yes    Ambulation/Gait Assistance  5: Supervision    Ambulation/Gait Assistance Details  excessive UE weight bearing on RW    Ambulation Distance (Feet)  100 Feet    Assistive device  Rolling walker;Other (Comment)   right AFO   Gait Pattern  Step-to pattern;Decreased step length - left;Decreased stance time - right;Decreased stride length;Decreased hip/knee flexion -  right;Decreased weight shift to right;Right hip hike;Antalgic;Lateral hip instability;Trendelenburg;Trunk flexed;Narrow base of support    Ambulation Surface  Indoor;Level    Gait velocity  0.61 ft/sec      Balance   Balance Assessed  Yes      Static Standing Balance   Static Standing - Balance Support  Bilateral upper extremity supported;No upper extremity supported   RW, assessed static without UE support   Static Standing - Level of Assistance  5: Stand by assistance;4: Min assist   minA no UE support & supervision with RW support   Static Standing - Comment/# of Minutes  with RW support 5 minutes of standing, without UE support multiple attempts for 30 seconds      Dynamic Standing Balance   Dynamic Standing - Balance Support  Left upper extremity supported;Bilateral upper extremity supported   scans with BUE support & reaches with dominant UE   Dynamic Standing - Level of Assistance  5: Stand by assistance;4: Min assist   MinA reaching & supervision scanning   Dynamic Standing - Balance Activities  Head nods;Head turns;Reaching for objects    Dynamic Standing - Comments  looks to side with cervical motion only (precaution not to twist so no trunk rotation),  reaches 2" anterior to RW with minA      Standardized Balance Assessment   Standardized Balance Assessment  Berg Balance Test      Berg Balance Test   Sit to Stand  Needs minimal aid to stand or to stabilize    Standing Unsupported  Needs several tries to stand 30 seconds unsupported    Sitting with Back Unsupported but Feet Supported on Floor or Stool  Able to sit safely and securely 2 minutes    Stand to Sit  Controls descent by using hands    Transfers  Able to transfer safely, definite need of hands    Standing Unsupported with Eyes Closed  Needs help to keep from falling    Standing Ubsupported with Feet Together  Needs help to attain position and unable to hold for 15 seconds    From Standing, Reach Forward with  Outstretched Arm  Loses balance while trying/requires external support    From Standing Position, Pick up Object from Floor  Unable to try/needs assist to keep balance    From Standing Position, Turn to Look Behind Over each Shoulder  Needs assist to keep from losing balance and falling    Turn 360 Degrees  Needs assistance while turning    Standing Unsupported, Alternately Place Feet on Step/Stool  Needs assistance to keep from falling or unable to try    Standing Unsupported, One Foot in [employer] balance while stepping or standing    Standing on One Leg  Unable to try or needs assist to prevent fall    Total Score  12                Objective measurements completed on examination: See above findings.                PT Short Term Goals - 05/11/18 1219      PT SHORT TERM GOAL #1   Title  Patient demonstrates understanding of initial HEP including adhering to percautions. (All STGs Target Dates: 06/10/2018)    Time  1    Period  Months    Status  New    Target Date  06/10/18      PT SHORT TERM GOAL #2   Title  Patient ambulates 200' with RW & AFO with supervision.     Time  1    Period  Months    Status  New    Target Date  06/10/18  PT SHORT TERM GOAL #3   Title  Patient negotiates ramps & curbs with RW & AFO with supervision.     Time  1    Period  Months    Status  New    Target Date  06/10/18      PT SHORT TERM GOAL #4   Title  Patient able to stand 2 minutes without UE support with supervision.     Time  1    Period  Months    Status  New    Target Date  06/10/18        PT Long Term Goals - 05/11/18 1213      PT LONG TERM GOAL #1   Title  Patient verbalizes understanding of fall prevention strategies including assistive devices and any percautions that still exist. (All LTGs Target Dates: 08/05/2018)    Time  3    Period  Months    Status  New    Target Date  08/05/18      PT LONG TERM GOAL #2   Title  Patient verbalizes &  demonstrates understanding of ongoing HEP / fitness plan including returning gym.     Time  3    Period  Months    Target Date  08/05/18      PT LONG TERM GOAL #3   Title  Berg Balance >36/56 to indicate lower fall risk.     Time  3    Period  Months    Target Date  08/05/18      PT LONG TERM GOAL #4   Title  Patient ambulates >300' with LRAD modified independent for community mobility.     Time  3    Period  Months    Status  New    Target Date  08/05/18      PT LONG TERM GOAL #5   Title  Patient negotiates ramps, curbs & stairs with LRAD modified independent to enable community access.     Time  3    Period  Months    Status  New    Target Date  08/05/18             Plan - 05/10/18 1800    Clinical Impression Statement  This 78yo female deveoped weakness in right lower extremity with thoracic myelopathy. She had surgery with laminectomy 2.5 weeks prior to PT evaluation with back percautions. She has balance impairments including static stance without UE support requires multiple attempts & intermittent assist. She is dependent on RW for support & supervision for dynamic standing activities. Berg Balance of 12/56 indicates high fall risk and dependency with standing ADLs. Her gait is impaired with deviations and gait velocity of 0.61 ft/sec indicating high fall risk. Patient appears would benefit from skilled PT care to improve strength, balance, gait & function.     History and Personal Factors relevant to plan of care:  Thoracic myelopathy, T1-2 laminectomy, arthritis, HTN    Clinical Presentation  Evolving    Clinical Presentation due to:  high fall risk, increased RLE weakness, UE pain from increased use,     Clinical Decision Making  Moderate    Rehab Potential  Good    PT Frequency  2x / week    PT Duration  12 weeks    PT Treatment/Interventions  ADLs/Self Care Home Management;Canalith Repostioning;DME Instruction;Gait training;Stair training;Functional mobility  training;Therapeutic activities;Therapeutic exercise;Balance training;Neuromuscular re-education;Patient/family education;Orthotic Fit/Training;Vestibular    PT Next Visit Plan  HEP supine &  sitting for RLE strength, review back percautions with laminectomy    Consulted and Agree with Plan of Care  Patient;Family member/caregiver    Family Member Consulted  husband, Naylee Mise       Patient will benefit from skilled therapeutic intervention in order to improve the following deficits and impairments:  Abnormal gait, Decreased activity tolerance, Decreased balance, Decreased endurance, Decreased knowledge of use of DME, Decreased knowledge of precautions, Decreased mobility, Decreased strength, Dizziness, Postural dysfunction, Pain  Visit Diagnosis: Muscle weakness (generalized)  Unsteadiness on feet  Paralytic gait  Other abnormalities of gait and mobility  Acute pain of right shoulder     Problem List Patient Active Problem List   Diagnosis Date Noted  . Essential hypertension   . Cervical spondylosis without myelopathy   . Steroid-induced hyperglycemia   . Postoperative pain   . Hyponatremia   . Leucocytosis   . Thoracic myelopathy 04/22/2018  . ALLERGIC RHINITIS 01/19/2007    Vladimir Faster PT, DPT 05/11/2018, 12:23 PM  [facility] [address] [place], [place], [address] Phone: [phone]   Fax:  [phone]  Name: YOCELYN CORBAN MRN: [id number] Date of Birth: Dec 25, 1940

## 2018-05-16 ENCOUNTER — Ambulatory Visit: Payer: Medicare Other | Admitting: Occupational Therapy

## 2018-05-17 ENCOUNTER — Encounter: Payer: Medicare Other | Admitting: Physical Medicine & Rehabilitation

## 2018-05-17 ENCOUNTER — Ambulatory Visit: Payer: Medicare Other

## 2018-05-17 DIAGNOSIS — R2689 Other abnormalities of gait and mobility: Secondary | ICD-10-CM

## 2018-05-17 DIAGNOSIS — M6281 Muscle weakness (generalized): Secondary | ICD-10-CM

## 2018-05-17 DIAGNOSIS — R2681 Unsteadiness on feet: Secondary | ICD-10-CM

## 2018-05-17 NOTE — Therapy (Signed)
[facility] [address] [place], [place], [address] Phone: [phone]   Fax:  [phone]  Physical Therapy Treatment  Patient Details  Name: Cheryl Pearson MRN: [id number] Date of Birth: Dec 03, 1940 Referring Provider (PT): Faith Rogue, MD   Encounter Date: 05/17/2018  PT End of Session - 05/17/18 0857    Visit Number  2    Number of Visits  25    Date for PT Re-Evaluation  07/08/18    Authorization Type  [employer]HC Medicare    PT Start Time  0845    PT Stop Time  0930    PT Time Calculation (min)  45 min    Equipment Utilized During Treatment  Gait belt    Activity Tolerance  Patient tolerated treatment well    Behavior During Therapy  [facility] for tasks assessed/performed       Past Medical History:  Diagnosis Date  . Anxiety   . Arthritis   . Colitis   . Early age-related macular degeneration   . Hernia   . Hypertension   . Infertility, female   . Menopausal symptoms   . Rosacea   . Thyroid disease 1968   hypothyroidism treated with meds for short time then all labs normal    Past Surgical History:  Procedure Laterality Date  . bone spur Left   . COLONOSCOPY  11/2011   polyp, ulcerative coliits inactive  . COLONOSCOPY  2005  . COLONOSCOPY  1970's   diagnosed with ulcerative colitis  . ESOPHAGOGASTRODUODENOSCOPY (EGD) WITH PROPOFOL N/A 11/20/2014   Procedure: ESOPHAGOGASTRODUODENOSCOPY (EGD) WITH PROPOFOL;  Surgeon: Carman Ching, MD;  Location: [facility];  Service: Endoscopy;  Laterality: N/A;  . INGUINAL HERNIA REPAIR Right 43 mos old  . LAPAROSCOPIC CHOLECYSTECTOMY  09/1998  . SAVORY DILATION N/A 11/20/2014   Procedure: SAVORY DILATION;  Surgeon: Carman Ching, MD;  Location: [facility];  Service: Endoscopy;  Laterality: N/A;  . WISDOM TOOTH EXTRACTION  1968    There were no vitals filed for this visit.  Subjective Assessment - 05/17/18 0851    Subjective  Pt brought paperwork from doctor stating  that she has no specific bending or twisting restrictions and lifting is up to pts descression/pain.     Patient is accompained by:  Family member    Pertinent History  Thoracic myelopathy, T1-2 laminectomy, arthritis, HTN,     Limitations  Lifting;Standing;Walking;House hold activities    Patient Stated Goals  To return to as much activity as possible and get return to muscles.     Currently in Pain?  No/denies       [facility] Adult PT Treatment/Exercise - 05/17/18 0859      Ambulation/Gait   Ambulation/Gait  Yes    Ambulation/Gait Assistance  5: Supervision    Ambulation/Gait Assistance Details  Pt required increased time and fatigued quickly, excessive UE reliance.     Ambulation Distance (Feet)  115 Feet    Assistive device  Rolling walker;Other (Comment)   R AFO   Gait Pattern  Step-to pattern;Decreased step length - left;Decreased stance time - right;Decreased stride length;Decreased hip/knee flexion - right;Decreased weight shift to right;Right hip hike;Antalgic;Lateral hip instability;Trendelenburg;Trunk flexed;Narrow base of support    Ambulation Surface  Level;Indoor      Exercises   Exercises  Knee/Hip;Ankle      Knee/Hip Exercises: Seated   Long Arc Quad  AROM;Strengthening;Both;2 sets;10 reps    Marching  AROM;Strengthening;Both;2 sets;10 reps    Abduction/Adduction   AROM;Strengthening;Both;2  sets;10 reps    Abd/Adduction Limitations  YTB, pillow       Ankle Exercises: Seated   Heel Raises  Both;10 reps    Toe Raise  10 reps        PT Education - 05/17/18 0935    Education Details  Initiated seated HEP.     Person(s) Educated  Patient;Spouse    Methods  Explanation;Demonstration;Tactile cues;Verbal cues;Handout    Comprehension  Verbalized understanding;Returned demonstration;Verbal cues required;Tactile cues required;Need further instruction       PT Short Term Goals - 05/11/18 1219      PT SHORT TERM GOAL #1   Title  Patient demonstrates understanding of  initial HEP including adhering to percautions. (All STGs Target Dates: 06/10/2018)    Time  1    Period  Months    Status  New    Target Date  06/10/18      PT SHORT TERM GOAL #2   Title  Patient ambulates 200' with RW & AFO with supervision.     Time  1    Period  Months    Status  New    Target Date  06/10/18      PT SHORT TERM GOAL #3   Title  Patient negotiates ramps & curbs with RW & AFO with supervision.     Time  1    Period  Months    Status  New    Target Date  06/10/18      PT SHORT TERM GOAL #4   Title  Patient able to stand 2 minutes without UE support with supervision.     Time  1    Period  Months    Status  New    Target Date  06/10/18        PT Long Term Goals - 05/11/18 1213      PT LONG TERM GOAL #1   Title  Patient verbalizes understanding of fall prevention strategies including assistive devices and any percautions that still exist. (All LTGs Target Dates: 08/05/2018)    Time  3    Period  Months    Status  New    Target Date  08/05/18      PT LONG TERM GOAL #2   Title  Patient verbalizes & demonstrates understanding of ongoing HEP / fitness plan including returning gym.     Time  3    Period  Months    Target Date  08/05/18      PT LONG TERM GOAL #3   Title  Berg Balance >36/56 to indicate lower fall risk.     Time  3    Period  Months    Target Date  08/05/18      PT LONG TERM GOAL #4   Title  Patient ambulates >300' with LRAD modified independent for community mobility.     Time  3    Period  Months    Status  New    Target Date  08/05/18      PT LONG TERM GOAL #5   Title  Patient negotiates ramps, curbs & stairs with LRAD modified independent to enable community access.     Time  3    Period  Months    Status  New    Target Date  08/05/18         Plan - 05/17/18 1022    Clinical Impression Statement  Todays skilled session focused on gait training with RW/AFO  with supervision and BLE strengthening to initiate HEP with handout  provided and pt/spouse verbalizing understanding. Pt should benefit from continued PT sessions to progress towards goals.     Rehab Potential  Good    PT Frequency  2x / week    PT Duration  12 weeks    PT Treatment/Interventions  ADLs/Self Care Home Management;Canalith Repostioning;DME Instruction;Gait training;Stair training;Functional mobility training;Therapeutic activities;Therapeutic exercise;Balance training;Neuromuscular re-education;Patient/family education;Orthotic Fit/Training;Vestibular    PT Next Visit Plan  Continue with BLE strengthening, endurance training, high level balance     PT Home Exercise Plan  [email or web address]    Consulted and Agree with Plan of Care  Patient;Family member/caregiver    Family Member Consulted  husband, Varney BilesRick Lawless       Patient will benefit from skilled therapeutic intervention in order to improve the following deficits and impairments:  Abnormal gait, Decreased activity tolerance, Decreased balance, Decreased endurance, Decreased knowledge of use of DME, Decreased knowledge of precautions, Decreased mobility, Decreased strength, Dizziness, Postural dysfunction, Pain  Visit Diagnosis: Muscle weakness (generalized)  Unsteadiness on feet  Other abnormalities of gait and mobility     Problem List Patient Active Problem List   Diagnosis Date Noted  . Essential hypertension   . Cervical spondylosis without myelopathy   . Steroid-induced hyperglycemia   . Postoperative pain   . Hyponatremia   . Leucocytosis   . Thoracic myelopathy 04/22/2018  . ALLERGIC RHINITIS 01/19/2007   Darbi Chandran, PTA  [place] Clabo A Aviah Sorci 05/17/2018, 10:40 AM  [facility] [address] [place], [place], [address] Phone: [phone]   Fax:  [phone]  Name: Waldo LaineLila F Dalsanto MRN: [id number] Date of Birth: 05/14/1940

## 2018-05-17 NOTE — Patient Instructions (Signed)
Access Code: [email or web address]  URL: [email or web address]  Date: 05/17/2018  Prepared by: Jonathon Jordan Felts   Exercises  Seated Long Arc Quad - 10 reps - 2 sets - 1x daily - 7x weekly  Seated Hip Flexion March with Ankle Weights - 10 reps - 2 sets - 1x daily - 7x weekly  Seated Hip Abduction with Resistance - 10 reps - 2 sets - 1x daily - 7x weekly  Seated Hip Adduction Isometrics with Ball - 10 reps - 2 sets - 1x daily - 7x weekly  Seated Heel Raise - 10 reps - 2 sets - 1x daily - 7x weekly  Seated Toe Raise - 10 reps - 2 sets - 1x daily - 7x weekly

## 2018-05-18 ENCOUNTER — Encounter: Payer: Medicare Other | Admitting: Physical Medicine & Rehabilitation

## 2018-05-19 ENCOUNTER — Ambulatory Visit: Payer: Medicare Other

## 2018-05-19 DIAGNOSIS — M6281 Muscle weakness (generalized): Secondary | ICD-10-CM

## 2018-05-19 DIAGNOSIS — R2681 Unsteadiness on feet: Secondary | ICD-10-CM

## 2018-05-19 DIAGNOSIS — R2689 Other abnormalities of gait and mobility: Secondary | ICD-10-CM

## 2018-05-19 NOTE — Therapy (Signed)
[facility] [address] [place], [place], [address] Phone: [phone]   Fax:  [phone]  Physical Therapy Treatment  Patient Details  Name: Cheryl LaineLila F Velie MRN: [id number] Date of Birth: 03/03/1941 Referring Provider (PT): Faith RogueZachary Swartz, MD   Encounter Date: 05/19/2018  PT End of Session - 05/19/18 1206    Visit Number  3    Number of Visits  25    Date for PT Re-Evaluation  07/08/18    Authorization Type  [employer] Medicare    PT Start Time  1103    PT Stop Time  1146    PT Time Calculation (min)  43 min    Equipment Utilized During Treatment  --   ming guard to S prn   Activity Tolerance  Patient tolerated treatment well    Behavior During Therapy  [facility] for tasks assessed/performed       Past Medical History:  Diagnosis Date  . Anxiety   . Arthritis   . Colitis   . Early age-related macular degeneration   . Hernia   . Hypertension   . Infertility, female   . Menopausal symptoms   . Rosacea   . Thyroid disease 1968   hypothyroidism treated with meds for short time then all labs normal    Past Surgical History:  Procedure Laterality Date  . bone spur Left   . COLONOSCOPY  11/2011   polyp, ulcerative coliits inactive  . COLONOSCOPY  2005  . COLONOSCOPY  1970's   diagnosed with ulcerative colitis  . ESOPHAGOGASTRODUODENOSCOPY (EGD) WITH PROPOFOL N/A 11/20/2014   Procedure: ESOPHAGOGASTRODUODENOSCOPY (EGD) WITH PROPOFOL;  Surgeon: Carman ChingJames Edwards, MD;  Location: [facility];  Service: Endoscopy;  Laterality: N/A;  . INGUINAL HERNIA REPAIR Right 3618 mos old  . LAPAROSCOPIC CHOLECYSTECTOMY  09/1998  . SAVORY DILATION N/A 11/20/2014   Procedure: SAVORY DILATION;  Surgeon: Carman ChingJames Edwards, MD;  Location: [facility];  Service: Endoscopy;  Laterality: N/A;  . WISDOM TOOTH EXTRACTION  1968    There were no vitals filed for this visit.  Subjective Assessment - 05/19/18 1107    Subjective  Pt denied falls or changes  since last visit. Pt reported she performing HEP, and questioned her technique.     Patient is accompained by:  Family member    Pertinent History  Thoracic myelopathy, T1-2 laminectomy, arthritis, HTN,     Patient Stated Goals  To return to as much activity as possible and get return to muscles.     Currently in Pain?  No/denies        Therex:  Access Code: [email or web address]  URL: [email or web address]  Date: 05/19/2018  Prepared by: Zerita BoersJennifer Heyward Douthit   Exercises  Seated Long Arc Quad - 10 reps - 2 sets - 1x daily - 4x weekly  Seated Hip Flexion March with Ankle Weights - 5 reps - 3 sets - 1x daily - 4x weekly  Seated Hip Abduction with Resistance - 10 reps - 3 sets - 1x daily - 4x weekly with RED theraband.  Seated Hip Adduction Isometrics with with pillow - 10 reps - 2 sets - 2-3 hold - 1x daily - 4x weekly  Seated Heel Raise - 10 reps - 2 sets - 1x daily - 4x weekly  Seated Toe Raise - 10 reps - 2 sets - 1x daily -4x weekly  Standing Knee Flexion Strengthening at Chair - 5-10 reps - 3 sets - 1x daily - 4x weekly with B UE support.  Cues to improve posture (excessive ant/post pelvic tilt during seated exercises without back support). Cues to improve eccentric control during all activities and to decr. Compensatory strategies during exercises 2/2 muscular fatigue.                        PT Education - 05/19/18 1203    Education Details  Reviewed HEP and modified as indicated, added hamstring strengthening.    Person(s) Educated  Patient;Spouse    Methods  Explanation;Demonstration;Tactile cues;Verbal cues;Handout    Comprehension  Returned demonstration;Verbalized understanding;Need further instruction       PT Short Term Goals - 05/11/18 1219      PT SHORT TERM GOAL #1   Title  Patient demonstrates understanding of initial HEP including adhering to percautions. (All STGs Target Dates: 06/10/2018)    Time  1    Period  Months    Status  New    Target  Date  06/10/18      PT SHORT TERM GOAL #2   Title  Patient ambulates 200' with RW & AFO with supervision.     Time  1    Period  Months    Status  New    Target Date  06/10/18      PT SHORT TERM GOAL #3   Title  Patient negotiates ramps & curbs with RW & AFO with supervision.     Time  1    Period  Months    Status  New    Target Date  06/10/18      PT SHORT TERM GOAL #4   Title  Patient able to stand 2 minutes without UE support with supervision.     Time  1    Period  Months    Status  New    Target Date  06/10/18        PT Long Term Goals - 05/11/18 1213      PT LONG TERM GOAL #1   Title  Patient verbalizes understanding of fall prevention strategies including assistive devices and any percautions that still exist. (All LTGs Target Dates: 08/05/2018)    Time  3    Period  Months    Status  New    Target Date  08/05/18      PT LONG TERM GOAL #2   Title  Patient verbalizes & demonstrates understanding of ongoing HEP / fitness plan including returning gym.     Time  3    Period  Months    Target Date  08/05/18      PT LONG TERM GOAL #3   Title  Berg Balance >36/56 to indicate lower fall risk.     Time  3    Period  Months    Target Date  08/05/18      PT LONG TERM GOAL #4   Title  Patient ambulates >300' with LRAD modified independent for community mobility.     Time  3    Period  Months    Status  New    Target Date  08/05/18      PT LONG TERM GOAL #5   Title  Patient negotiates ramps, curbs & stairs with LRAD modified independent to enable community access.     Time  3    Period  Months    Status  New    Target Date  08/05/18            Plan -  05/19/18 1109    Clinical Impression Statement  Today's skilled session focused on reviewing, modifying and adding to HEP, as pt reported incr. LBP after performing HEP. Pt able to improve technique and reduce excessive ant/post pelvic tilt with back supported during seated exercises. Pt did require rest  breaks 2/2 RLE muscular fatigue to ensure proper technique. Pt would continue to benefit from skilled PT to improve safety during functional mobility.     Rehab Potential  Good    PT Frequency  2x / week    PT Duration  12 weeks    PT Treatment/Interventions  ADLs/Self Care Home Management;Canalith Repostioning;DME Instruction;Gait training;Stair training;Functional mobility training;Therapeutic activities;Therapeutic exercise;Balance training;Neuromuscular re-education;Patient/family education;Orthotic Fit/Training;Vestibular    PT Next Visit Plan  Continue with BLE strengthening, endurance training, add balance exercises to HEP.     PT Home Exercise Plan  [email or web address]    Consulted and Agree with Plan of Care  Patient;Family member/caregiver    Family Member Consulted  husband, Varney BilesRick Mini       Patient will benefit from skilled therapeutic intervention in order to improve the following deficits and impairments:  Abnormal gait, Decreased activity tolerance, Decreased balance, Decreased endurance, Decreased knowledge of use of DME, Decreased knowledge of precautions, Decreased mobility, Decreased strength, Dizziness, Postural dysfunction, Pain  Visit Diagnosis: Muscle weakness (generalized)  Unsteadiness on feet  Other abnormalities of gait and mobility     Problem List Patient Active Problem List   Diagnosis Date Noted  . Essential hypertension   . Cervical spondylosis without myelopathy   . Steroid-induced hyperglycemia   . Postoperative pain   . Hyponatremia   . Leucocytosis   . Thoracic myelopathy 04/22/2018  . ALLERGIC RHINITIS 01/19/2007    Georgeana Oertel L 05/19/2018, 12:20 PM  [facility] [address] [place], [place], [address] Phone: [phone]   Fax:  [phone]  Name: Cheryl LaineLila F Pain MRN: [id number] Date of Birth: 02/12/1941  Zerita BoersJennifer Ryka Beighley, PT,DPT 05/19/18 12:23 PM Phone: [phone] Fax:  [phone]

## 2018-05-19 NOTE — Patient Instructions (Signed)
Access Code: [email or web address]  URL: [email or web address]  Date: 05/19/2018  Prepared by: Zerita Boers   Exercises  Seated Long Arc Quad - 10 reps - 2 sets - 1x daily - 4x weekly  Seated Hip Flexion March with Ankle Weights - 5 reps - 3 sets - 1x daily - 4x weekly  Seated Hip Abduction with Resistance - 10 reps - 3 sets - 1x daily - 4x weekly  Seated Hip Adduction Isometrics with Ball - 10 reps - 2 sets - 2-3 hold - 1x daily - 4x weekly  Seated Heel Raise - 10 reps - 2 sets - 1x daily - 7x weekly  Seated Toe Raise - 10 reps - 2 sets - 1x daily - 7x weekly  Standing Knee Flexion Strengthening at Chair - 5-10 reps - 3 sets - 1x daily - 4x weekly

## 2018-05-20 ENCOUNTER — Encounter: Payer: Self-pay | Admitting: Registered Nurse

## 2018-05-20 ENCOUNTER — Encounter: Payer: Medicare Other | Attending: Physical Medicine & Rehabilitation | Admitting: Registered Nurse

## 2018-05-20 ENCOUNTER — Other Ambulatory Visit: Payer: Self-pay

## 2018-05-20 VITALS — BP 145/80 | HR 90 | Ht 66.0 in | Wt 157.8 lb

## 2018-05-20 DIAGNOSIS — E785 Hyperlipidemia, unspecified: Secondary | ICD-10-CM | POA: Diagnosis not present

## 2018-05-20 DIAGNOSIS — M4714 Other spondylosis with myelopathy, thoracic region: Secondary | ICD-10-CM

## 2018-05-20 DIAGNOSIS — Z9889 Other specified postprocedural states: Secondary | ICD-10-CM | POA: Diagnosis not present

## 2018-05-20 DIAGNOSIS — I10 Essential (primary) hypertension: Secondary | ICD-10-CM

## 2018-05-20 NOTE — Progress Notes (Signed)
Subjective:    Patient ID: Cheryl Pearson, female    DOB: 1940-12-24, 78 y.o.   MRN: [id number]  HPI: Cheryl Pearson is a 78 y.o. female who is here for Transitional care visit.  She was admitted to [facility] for elective surgery for Thoracic Laminectomy  T-1-T2 Decompression using pedicle screws by Dr. Conchita Paris on 04/22/2018.  She was admitted to [facility] on 04/26/2018 and discharge on 05/06/2018. She was discharge home and receiving outpatient therapy at [facility]. She reports good appetite and denies pain. She rates her pain 0.   Husband in room all questions answered.   Pain Inventory Average Pain 2 Pain Right Now 0 My pain is sharp and aching  In the last 24 hours, has pain interfered with the following? General activity 1 Relation with others 0 Enjoyment of life 0 What TIME of day is your pain at its worst? morning night Sleep (in general) Fair  Pain is worse with: some activites Pain improves with: rest, heat/ice and medication Relief from Meds: no meds   Mobility use a walker ability to climb steps?  no Do you have any goals in this area?  yes  Function retired I need assistance with the following:  meal prep, household duties and shopping  Neuro/Psych weakness numbness tremor tingling trouble walking  Prior Studies Any changes since last visit?  no  Physicians involved in your care Any changes since last visit?  no Neurosurgeon Dr. Rica Records   Family History  Problem Relation Age of Onset  . Cancer Mother        uterine cancer  . Osteoarthritis Mother   . Rheum arthritis Mother   . COPD Mother   . COPD Father   . COPD Brother   . Cancer Maternal Aunt        colon cancer   Social History   Socioeconomic History  . Marital status: Married    Spouse name: Not on file  . Number of children: Not on file  . Years of education: Not on file  . Highest education level: Not on file  Occupational History  .  Not on file  Social Needs  . Financial resource strain: Not on file  . Food insecurity:    Worry: Not on file    Inability: Not on file  . Transportation needs:    Medical: Not on file    Non-medical: Not on file  Tobacco Use  . Smoking status: Never Smoker  . Smokeless tobacco: Never Used  Substance and Sexual Activity  . Alcohol use: Yes    Alcohol/week: 1.0 standard drinks    Types: 1 Standard drinks or equivalent per week    Comment: occasionaly  . Drug use: No  . Sexual activity: Yes    Birth control/protection: Post-menopausal  Lifestyle  . Physical activity:    Days per week: Not on file    Minutes per session: Not on file  . Stress: Not on file  Relationships  . Social connections:    Talks on phone: Not on file    Gets together: Not on file    Attends religious service: Not on file    Active member of club or organization: Not on file    Attends meetings of clubs or organizations: Not on file    Relationship status: Not on file  Other Topics Concern  . Not on file  Social History Narrative  . Not on file   Past  Surgical History:  Procedure Laterality Date  . bone spur Left   . COLONOSCOPY  11/2011   polyp, ulcerative coliits inactive  . COLONOSCOPY  2005  . COLONOSCOPY  1970's   diagnosed with ulcerative colitis  . ESOPHAGOGASTRODUODENOSCOPY (EGD) WITH PROPOFOL N/A 11/20/2014   Procedure: ESOPHAGOGASTRODUODENOSCOPY (EGD) WITH PROPOFOL;  Surgeon: Carman ChingJames Edwards, MD;  Location: [facility];  Service: Endoscopy;  Laterality: N/A;  . INGUINAL HERNIA REPAIR Right 7418 mos old  . LAPAROSCOPIC CHOLECYSTECTOMY  09/1998  . SAVORY DILATION N/A 11/20/2014   Procedure: SAVORY DILATION;  Surgeon: Carman ChingJames Edwards, MD;  Location: [facility];  Service: Endoscopy;  Laterality: N/A;  . WISDOM TOOTH EXTRACTION  1968   Past Medical History:  Diagnosis Date  . Anxiety   . Arthritis   . Colitis   . Early age-related macular degeneration   . Hernia   . Hypertension   .  Infertility, female   . Menopausal symptoms   . Rosacea   . Thyroid disease 1968   hypothyroidism treated with meds for short time then all labs normal   BP (!) 145/80   Pulse 90   Ht 5\' 6"  (1.676 m)   Wt 157 lb 12.8 oz (71.6 kg)   SpO2 94%   BMI 25.47 kg/m   Opioid Risk Score:   Fall Risk Score:  `1  Depression screen PHQ 2/9  [facility] 05/20/2018 04/19/2015  Decreased Interest 0 0  Down, Depressed, Hopeless 0 0  PHQ - 2 Score 0 0   Review of Systems  Constitutional: Negative.   HENT: Negative.   Eyes: Negative.   Respiratory: Negative.   Cardiovascular: Negative.   Gastrointestinal: Negative.   Endocrine: Negative.   Genitourinary: Negative.   Musculoskeletal: Negative.   Skin: Negative.   Allergic/Immunologic: Negative.   Neurological: Negative.   Hematological: Negative.   Psychiatric/Behavioral: Negative.   All other systems reviewed and are negative.      Objective:   Physical Exam Vitals signs and nursing note reviewed.  Constitutional:      Appearance: Normal appearance.  Neck:     Musculoskeletal: Normal range of motion and neck supple.  Cardiovascular:     Rate and Rhythm: Normal rate and regular rhythm.     Pulses: Normal pulses.     Heart sounds: Normal heart sounds.  Pulmonary:     Effort: Pulmonary effort is normal.     Breath sounds: Normal breath sounds.  Musculoskeletal:     Comments: Normal Muscle Bulk and Muscle Testing Reveals:  Upper Extremities: Full ROM and Muscle Strength 5/5  Lower Extremities: Right: Decreased ROM and Muscle Strength 4/5  Wearing AFO Left: Full ROM and Muscle Strength 5/5 Arises from Table slowly using walker for support Narrow Based Gait   Skin:    General: Skin is warm and dry.  Neurological:     Mental Status: She is alert and oriented to person, place, and time.  Psychiatric:        Mood and Affect: Mood normal.        Behavior: Behavior normal.           Assessment & Plan:  1.  Thoracic Myelpathy: S/P T-1-T-2 Laminectomy Decompression of Spinal Cord Pedicle Screw: Dr. Conchita ParisNundkumar Following. Continue Outpatient therapy.  2. Hypertension: Continue current medication regimen. PCP Following.  3. Dyslipidemia: Continue current medication regimen. PCP Following.   20  minutes of face to face patient care time was spent during this visit. All questions were encouraged  and answered.  F/U with Dr. Riley Kill in 4-6 weeks

## 2018-05-23 ENCOUNTER — Encounter: Payer: Self-pay | Admitting: Physical Therapy

## 2018-05-23 ENCOUNTER — Ambulatory Visit: Payer: Medicare Other | Admitting: Physical Therapy

## 2018-05-23 DIAGNOSIS — M6281 Muscle weakness (generalized): Secondary | ICD-10-CM | POA: Diagnosis not present

## 2018-05-23 DIAGNOSIS — R2689 Other abnormalities of gait and mobility: Secondary | ICD-10-CM

## 2018-05-23 DIAGNOSIS — R2681 Unsteadiness on feet: Secondary | ICD-10-CM

## 2018-05-23 NOTE — Patient Instructions (Signed)
Access Code: [email or web address]  URL: [email or web address]  Date: 05/23/2018  Prepared by: Sallyanne Kuster   Exercises  Seated Long Arc Quad - 10 reps - 2 sets - 1x daily - 4x weekly  Seated Hip Flexion March with Ankle Weights - 5 reps - 3 sets - 1x daily - 4x weekly  Seated Hip Abduction with Resistance - 10 reps - 3 sets - 1x daily - 4x weekly  Seated Hip Adduction Isometrics with Ball - 10 reps - 2 sets - 2-3 hold - 1x daily - 4x weekly  Seated Heel Raise - 10 reps - 2 sets - 1x daily - 7x weekly  Seated Toe Raise - 10 reps - 2 sets - 1x daily - 7x weekly  Standing Knee Flexion Strengthening at Chair - 5-10 reps - 3 sets - 1x daily - 4x weekly

## 2018-05-25 NOTE — Therapy (Signed)
[facility] [address] [place], [place], [address] Phone: [phone]   Fax:  [phone]  Physical Therapy Treatment  Patient Details  Name: Cheryl LaineLila F Pearson MRN: [id number] Date of Birth: 01/26/1941 Referring Provider (PT): Faith RogueZachary Swartz, MD   Encounter Date: 05/23/2018   05/23/18 1455  PT Visits / Re-Eval  Visit Number 4  Number of Visits 25  Date for PT Re-Evaluation 07/08/18  Authorization  Authorization Type [employer] Medicare  PT Time Calculation  PT Start Time 1450  PT Stop Time 1530  PT Time Calculation (min) 40 min  PT - End of Session  Equipment Utilized During Treatment Gait belt  Activity Tolerance Patient tolerated treatment well  Behavior During Therapy [facility] for tasks assessed/performed     Past Medical History:  Diagnosis Date  . Anxiety   . Arthritis   . Colitis   . Early age-related macular degeneration   . Hernia   . Hypertension   . Infertility, female   . Menopausal symptoms   . Rosacea   . Thyroid disease 1968   hypothyroidism treated with meds for short time then all labs normal    Past Surgical History:  Procedure Laterality Date  . bone spur Left   . COLONOSCOPY  11/2011   polyp, ulcerative coliits inactive  . COLONOSCOPY  2005  . COLONOSCOPY  1970's   diagnosed with ulcerative colitis  . ESOPHAGOGASTRODUODENOSCOPY (EGD) WITH PROPOFOL N/A 11/20/2014   Procedure: ESOPHAGOGASTRODUODENOSCOPY (EGD) WITH PROPOFOL;  Surgeon: Carman ChingJames Edwards, MD;  Location: [facility];  Service: Endoscopy;  Laterality: N/A;  . INGUINAL HERNIA REPAIR Right 3118 mos old  . LAPAROSCOPIC CHOLECYSTECTOMY  09/1998  . SAVORY DILATION N/A 11/20/2014   Procedure: SAVORY DILATION;  Surgeon: Carman ChingJames Edwards, MD;  Location: [facility];  Service: Endoscopy;  Laterality: N/A;  . WISDOM TOOTH EXTRACTION  1968    There were no vitals filed for this visit.     05/23/18 1454  Symptoms/Limitations  Subjective Still  having issues with HEP. Right leg is good for up to 2-3 reps, then it's done.   Patient is accompained by: Family member  Pertinent History Thoracic myelopathy, T1-2 laminectomy, arthritis, HTN,   Limitations Lifting;Standing;Walking;House hold activities  Patient Stated Goals To return to as much activity as possible and get return to muscles.   Pain Assessment  Currently in Pain? No/denies  Pain Score 0      05/23/18 1526  Exercises  Exercises Other Exercises  Other Exercises  reviewed current HEP. with pt performing each a few reps in session with cues on correct form. modified some due to pt reported difficulty-refer to [facility] for changes.    Knee/Hip Exercises: Supine  Bridges AROM;Strengthening;3 sets;10 reps;Limitations  Bridges Limitations assist for LE stability with cues for full pelvic lift and slow controlled lowering to mat.  Straight Leg Raises AAROM;Strengthening;Right;1 set;10 reps;Limitations  Straight Leg Raises Limitations assist needed for control and form  Other Supine Knee/Hip Exercises hooklying with band around knees: right LE hip fall out x 10 reps with 5 sec holds.    Access Code: [email or web address]  URL: [email or web address]  Date: 05/23/2018  Prepared by: Sallyanne KusterKathy Bury   Exercises  Seated Long Arc Quad - 10 reps - 2 sets - 1x daily - 4x weekly  Seated Hip Flexion March with Ankle Weights - 5 reps - 3 sets - 1x daily - 4x weekly  Seated Hip Abduction with Resistance - 10 reps - 3 sets - 1x daily -  4x weekly  Seated Hip Adduction Isometrics with Ball - 10 reps - 2 sets - 2-3 hold - 1x daily - 4x weekly  Seated Heel Raise - 10 reps - 2 sets - 1x daily - 7x weekly  Seated Toe Raise - 10 reps - 2 sets - 1x daily - 7x weekly  Standing Knee Flexion Strengthening at Chair - 5-10 reps - 3 sets - 1x daily - 4x weekly    05/23/18 1730  PT Education  Education Details Revised HEP  Person(s) Educated Patient;Spouse  Methods  Explanation;Demonstration;Tactile cues;Verbal cues;Handout  Comprehension Verbalized understanding;Returned demonstration;Verbal cues required;Tactile cues required;Need further instruction     PT Short Term Goals - 05/11/18 1219      PT SHORT TERM GOAL #1   Title  Patient demonstrates understanding of initial HEP including adhering to percautions. (All STGs Target Dates: 06/10/2018)    Time  1    Period  Months    Status  New    Target Date  06/10/18      PT SHORT TERM GOAL #2   Title  Patient ambulates 200' with RW & AFO with supervision.     Time  1    Period  Months    Status  New    Target Date  06/10/18      PT SHORT TERM GOAL #3   Title  Patient negotiates ramps & curbs with RW & AFO with supervision.     Time  1    Period  Months    Status  New    Target Date  06/10/18      PT SHORT TERM GOAL #4   Title  Patient able to stand 2 minutes without UE support with supervision.     Time  1    Period  Months    Status  New    Target Date  06/10/18        PT Long Term Goals - 05/11/18 1213      PT LONG TERM GOAL #1   Title  Patient verbalizes understanding of fall prevention strategies including assistive devices and any percautions that still exist. (All LTGs Target Dates: 08/05/2018)    Time  3    Period  Months    Status  New    Target Date  08/05/18      PT LONG TERM GOAL #2   Title  Patient verbalizes & demonstrates understanding of ongoing HEP / fitness plan including returning gym.     Time  3    Period  Months    Target Date  08/05/18      PT LONG TERM GOAL #3   Title  Berg Balance >36/56 to indicate lower fall risk.     Time  3    Period  Months    Target Date  08/05/18      PT LONG TERM GOAL #4   Title  Patient ambulates >300' with LRAD modified independent for community mobility.     Time  3    Period  Months    Status  New    Target Date  08/05/18      PT LONG TERM GOAL #5   Title  Patient negotiates ramps, curbs & stairs with LRAD modified  independent to enable community access.     Time  3    Period  Months    Status  New    Target Date  08/05/18  05/23/18 1455  Plan  Clinical Impression Statement Today's skilled session focused on review of modfied HEP from last session as pt continues to report some being too difficult. Further modifcations made today. Remainder of session continued to focus on right LE strengthening. The pt is progressing toward goals and should benefit from continued PT to progress toward unmet goals.   Pt will benefit from skilled therapeutic intervention in order to improve on the following deficits Abnormal gait;Decreased activity tolerance;Decreased balance;Decreased endurance;Decreased knowledge of use of DME;Decreased knowledge of precautions;Decreased mobility;Decreased strength;Dizziness;Postural dysfunction;Pain  Rehab Potential Good  PT Frequency 2x / week  PT Duration 12 weeks  PT Treatment/Interventions ADLs/Self Care Home Management;Canalith Repostioning;DME Instruction;Gait training;Stair training;Functional mobility training;Therapeutic activities;Therapeutic exercise;Balance training;Neuromuscular re-education;Patient/family education;Orthotic Fit/Training;Vestibular  PT Next Visit Plan Continue with BLE strengthening, endurance training, add balance exercises to HEP.   PT Home Exercise Plan [email or web address]  Consulted and Agree with Plan of Care Patient;Family member/caregiver  Family Member Consulted husband, Varney BilesRick Brophy          Patient will benefit from skilled therapeutic intervention in order to improve the following deficits and impairments:  Abnormal gait, Decreased activity tolerance, Decreased balance, Decreased endurance, Decreased knowledge of use of DME, Decreased knowledge of precautions, Decreased mobility, Decreased strength, Dizziness, Postural dysfunction, Pain  Visit Diagnosis: Muscle weakness (generalized)  Unsteadiness on feet  Other abnormalities of gait and  mobility     Problem List Patient Active Problem List   Diagnosis Date Noted  . Essential hypertension   . Cervical spondylosis without myelopathy   . Steroid-induced hyperglycemia   . Postoperative pain   . Hyponatremia   . Leucocytosis   . Thoracic myelopathy 04/22/2018  . ALLERGIC RHINITIS 01/19/2007    Sallyanne KusterKathy Bury, PTA, [facility] [address], [address] [place], [place] [address] [phone] 05/25/18, 1:27 PM   Name: Cheryl LaineLila F Hugill MRN: [id number] Date of Birth: 06/29/1940

## 2018-05-27 ENCOUNTER — Encounter: Payer: Self-pay | Admitting: Physical Therapy

## 2018-05-27 ENCOUNTER — Ambulatory Visit: Payer: Medicare Other | Admitting: Physical Therapy

## 2018-05-27 DIAGNOSIS — M6281 Muscle weakness (generalized): Secondary | ICD-10-CM | POA: Diagnosis not present

## 2018-05-27 DIAGNOSIS — R2689 Other abnormalities of gait and mobility: Secondary | ICD-10-CM

## 2018-05-27 DIAGNOSIS — R2681 Unsteadiness on feet: Secondary | ICD-10-CM

## 2018-05-29 NOTE — Therapy (Signed)
[facility] [address] [place], [place], [address] Phone: [phone]   Fax:  [phone]  Physical Therapy Treatment  Patient Details  Name: Cheryl Pearson MRN: [id number] Date of Birth: 10/04/1940 Referring Provider (PT): Faith RogueZachary Swartz, MD   Encounter Date: 05/27/2018     05/27/18 1156  PT Visits / Re-Eval  Visit Number 5  Number of Visits 25  Date for PT Re-Evaluation 07/08/18  Authorization  Authorization Type [employer] Medicare  PT Time Calculation  PT Start Time 1150  PT Stop Time 1233  PT Time Calculation (min) 43 min  PT - End of Session  Equipment Utilized During Treatment Gait belt  Activity Tolerance Patient tolerated treatment well;No increased pain  Behavior During Therapy WFL for tasks assessed/performed    Past Medical History:  Diagnosis Date  . Anxiety   . Arthritis   . Colitis   . Early age-related macular degeneration   . Hernia   . Hypertension   . Infertility, female   . Menopausal symptoms   . Rosacea   . Thyroid disease 1968   hypothyroidism treated with meds for short time then all labs normal    Past Surgical History:  Procedure Laterality Date  . bone spur Left   . COLONOSCOPY  11/2011   polyp, ulcerative coliits inactive  . COLONOSCOPY  2005  . COLONOSCOPY  1970's   diagnosed with ulcerative colitis  . ESOPHAGOGASTRODUODENOSCOPY (EGD) WITH PROPOFOL N/A 11/20/2014   Procedure: ESOPHAGOGASTRODUODENOSCOPY (EGD) WITH PROPOFOL;  Surgeon: Carman ChingJames Edwards, MD;  Location: [facility];  Service: Endoscopy;  Laterality: N/A;  . INGUINAL HERNIA REPAIR Right 6518 mos old  . LAPAROSCOPIC CHOLECYSTECTOMY  09/1998  . SAVORY DILATION N/A 11/20/2014   Procedure: SAVORY DILATION;  Surgeon: Carman ChingJames Edwards, MD;  Location: [facility];  Service: Endoscopy;  Laterality: N/A;  . WISDOM TOOTH EXTRACTION  1968    There were no vitals filed for this visit.     05/27/18 1155  Symptoms/Limitations   Subjective HEP is going better with adjustments made at last time. Did take a Tylenol before coming in due to upper back pain and low back pain. 0/10 now, was 2/10 this am. No falls.   Patient is accompained by: Family member  Pertinent History Thoracic myelopathy, T1-2 laminectomy, arthritis, HTN,   Limitations Lifting;Standing;Walking;House hold activities  Patient Stated Goals To return to as much activity as possible and get return to muscles.   Pain Assessment  Currently in Pain? No/denies  Pain Score 0      05/27/18 1216  Transfers  Transfers Sit to Stand;Stand to Sit  Sit to Stand 5: Supervision;With upper extremity assist;With armrests;From chair/3-in-1  Stand to Sit 5: Supervision;With upper extremity assist;With armrests;To chair/3-in-1  Ambulation/Gait  Ambulation/Gait Yes  Ambulation/Gait Assistance 5: Supervision  Ambulation/Gait Assistance Details cues on posture, equal step length and to keep right Knee "soft" with stance due to continued recurvatum despite brace. no buckling noted with "soft" knee.   Ambulation Distance (Feet) 230 Feet (x1, around gym with activity)  Assistive device Rolling walker;Other (Comment) (right LE brace)  Gait Pattern Step-to pattern;Decreased step length - left;Decreased stance time - right;Decreased stride length;Decreased hip/knee flexion - right;Decreased weight shift to right;Right hip hike;Antalgic;Lateral hip instability;Trendelenburg;Trunk flexed;Narrow base of support  Ambulation Surface Level;Indoor  Neuro Re-ed   Neuro Re-ed Details  for strengtheing/coordination: at bottom of steps- left stance with right foot taps up/down bottom 2 steps x 10 reps with assist needed for hip/knee flexion  at times; then right stance with emphasis on keeping knee soft for left foot taps up/down bottom 3 steps for 10 reps. bil UE support with cues on form/technique.   Knee/Hip Exercises: Supine  Bridges AROM;Strengthening;Both;10 reps;Limitations;2 sets   Bridges Limitations no assist needed today. cues for full pelvic lift. x 10 reps with 5 sec holds, then 10 additional reps with yoga block squeeze between knees during lift with 5 sec holds.   Straight Leg Raises AAROM;Strengthening;Right;1 set;10 reps;Limitations  Straight Leg Raises Limitations assist needed for control and form  Other Supine Knee/Hip Exercises hooklying with band around knees: right LE hip fall out x 10 reps with 5 sec holds.        PT Short Term Goals - 05/11/18 1219      PT SHORT TERM GOAL #1   Title  Patient demonstrates understanding of initial HEP including adhering to percautions. (All STGs Target Dates: 06/10/2018)    Time  1    Period  Months    Status  New    Target Date  06/10/18      PT SHORT TERM GOAL #2   Title  Patient ambulates 200' with RW & AFO with supervision.     Time  1    Period  Months    Status  New    Target Date  06/10/18      PT SHORT TERM GOAL #3   Title  Patient negotiates ramps & curbs with RW & AFO with supervision.     Time  1    Period  Months    Status  New    Target Date  06/10/18      PT SHORT TERM GOAL #4   Title  Patient able to stand 2 minutes without UE support with supervision.     Time  1    Period  Months    Status  New    Target Date  06/10/18        PT Long Term Goals - 05/11/18 1213      PT LONG TERM GOAL #1   Title  Patient verbalizes understanding of fall prevention strategies including assistive devices and any percautions that still exist. (All LTGs Target Dates: 08/05/2018)    Time  3    Period  Months    Status  New    Target Date  08/05/18      PT LONG TERM GOAL #2   Title  Patient verbalizes & demonstrates understanding of ongoing HEP / fitness plan including returning gym.     Time  3    Period  Months    Target Date  08/05/18      PT LONG TERM GOAL #3   Title  Berg Balance >36/56 to indicate lower fall risk.     Time  3    Period  Months    Target Date  08/05/18      PT LONG TERM  GOAL #4   Title  Patient ambulates >300' with LRAD modified independent for community mobility.     Time  3    Period  Months    Status  New    Target Date  08/05/18      PT LONG TERM GOAL #5   Title  Patient negotiates ramps, curbs & stairs with LRAD modified independent to enable community access.     Time  3    Period  Months    Status  New  Target Date  08/05/18         05/27/18 1156  Plan  Clinical Impression Statement Today's skilled session continued to work on gait and strengthening without any issues reported/noted in session. The pt is progressing toward goals and should benefit from continued PT to progress toward unmet goals.   Pt will benefit from skilled therapeutic intervention in order to improve on the following deficits Abnormal gait;Decreased activity tolerance;Decreased balance;Decreased endurance;Decreased knowledge of use of DME;Decreased knowledge of precautions;Decreased mobility;Decreased strength;Dizziness;Postural dysfunction;Pain  Rehab Potential Good  PT Frequency 2x / week  PT Duration 12 weeks  PT Treatment/Interventions ADLs/Self Care Home Management;Canalith Repostioning;DME Instruction;Gait training;Stair training;Functional mobility training;Therapeutic activities;Therapeutic exercise;Balance training;Neuromuscular re-education;Patient/family education;Orthotic Fit/Training;Vestibular  PT Next Visit Plan Continue with BLE strengthening, endurance training, add balance exercises to HEP.   PT Home Exercise Plan [email or web address]  Consulted and Agree with Plan of Care Patient;Family member/caregiver  Family Member Consulted husband, Cheryl Pearson          Patient will benefit from skilled therapeutic intervention in order to improve the following deficits and impairments:  Abnormal gait, Decreased activity tolerance, Decreased balance, Decreased endurance, Decreased knowledge of use of DME, Decreased knowledge of precautions, Decreased mobility, Decreased  strength, Dizziness, Postural dysfunction, Pain  Visit Diagnosis: Muscle weakness (generalized)  Unsteadiness on feet  Other abnormalities of gait and mobility     Problem List Patient Active Problem List   Diagnosis Date Noted  . Essential hypertension   . Cervical spondylosis without myelopathy   . Steroid-induced hyperglycemia   . Postoperative pain   . Hyponatremia   . Leucocytosis   . Thoracic myelopathy 04/22/2018  . ALLERGIC RHINITIS 01/19/2007    Cheryl Pearson, PTA, [facility] Neuro [facility] [address] [place], [place] [address] [phone] 05/29/18, 12:37 PM   Name: Cheryl Pearson MRN: [id number] Date of Birth: 06-29-1940

## 2018-05-31 ENCOUNTER — Ambulatory Visit: Payer: Medicare Other | Admitting: Occupational Therapy

## 2018-05-31 ENCOUNTER — Ambulatory Visit: Payer: Medicare Other | Attending: Physical Medicine & Rehabilitation | Admitting: Physical Therapy

## 2018-05-31 ENCOUNTER — Encounter: Payer: Self-pay | Admitting: Occupational Therapy

## 2018-05-31 ENCOUNTER — Encounter: Payer: Self-pay | Admitting: Physical Therapy

## 2018-05-31 DIAGNOSIS — R2689 Other abnormalities of gait and mobility: Secondary | ICD-10-CM | POA: Insufficient documentation

## 2018-05-31 DIAGNOSIS — M6281 Muscle weakness (generalized): Secondary | ICD-10-CM | POA: Diagnosis present

## 2018-05-31 DIAGNOSIS — R2681 Unsteadiness on feet: Secondary | ICD-10-CM | POA: Insufficient documentation

## 2018-05-31 DIAGNOSIS — M25511 Pain in right shoulder: Secondary | ICD-10-CM | POA: Diagnosis present

## 2018-05-31 DIAGNOSIS — R261 Paralytic gait: Secondary | ICD-10-CM | POA: Diagnosis present

## 2018-05-31 NOTE — Patient Instructions (Signed)
Access Code: [email or web address]  URL: [email or web address]  Date: 05/31/2018  Prepared by: Sallyanne Kuster   Exercises  Seated Long Arc Quad - 10 reps - 2 sets - 1x daily - 4x weekly  Seated Hip Flexion March with Ankle Weights - 5 reps - 3 sets - 1x daily - 4x weekly  Seated Hip Abduction with Resistance - 10 reps - 3 sets - 1x daily - 4x weekly  Seated Hip Adduction Isometrics with Ball - 10 reps - 2 sets - 2-3 hold - 1x daily - 4x weekly  Seated Heel Raise - 10 reps - 2 sets - 1x daily - 7x weekly  Seated Toe Raise - 10 reps - 2 sets - 1x daily - 7x weekly  Standing Knee Flexion Strengthening at Chair - 5-10 reps - 3 sets - 1x daily - 4x weekly  Standing Balance with Eyes Closed on Foam - 3 reps - 1 sets - 30 hold - 1x daily - 5x weekly  Wide Stance with Head Rotation on Foam Pad - 10 reps - 1 sets - 1x daily - 5x weekly  Wide Stance with Head Nods on Foam Pad - 10 reps - 1 sets - 1x daily - 5x weekly  Tandem Stance in Corner - 3 reps - 1 sets - 30 hold - 1x daily - 5x weekly

## 2018-05-31 NOTE — Therapy (Signed)
[facility] [address] [place], [place], [address] Phone: [phone]   Fax:  [phone]  Occupational Therapy Treatment  Patient Details  Name: Cheryl Pearson MRN: [id number] Date of Birth: 03/18/1941 No data recorded  Encounter Date: 05/31/2018  OT End of Session - 05/31/18 1457    Visit Number  2    Number of Visits  25    Date for OT Re-Evaluation  08/08/18    Authorization Type  [place] [employer] UHC--awaiting insurance verification    Authorization - Visit Number  2    Authorization - Number of Visits  10    OT Start Time  1404    OT Stop Time  1455    OT Time Calculation (min)  51 min    Activity Tolerance  Patient tolerated treatment well    Behavior During Therapy  [facility] for tasks assessed/performed       Past Medical History:  Diagnosis Date  . Anxiety   . Arthritis   . Colitis   . Early age-related macular degeneration   . Hernia   . Hypertension   . Infertility, female   . Menopausal symptoms   . Rosacea   . Thyroid disease 1968   hypothyroidism treated with meds for short time then all labs normal    Past Surgical History:  Procedure Laterality Date  . bone spur Left   . COLONOSCOPY  11/2011   polyp, ulcerative coliits inactive  . COLONOSCOPY  2005  . COLONOSCOPY  1970's   diagnosed with ulcerative colitis  . ESOPHAGOGASTRODUODENOSCOPY (EGD) WITH PROPOFOL N/A 11/20/2014   Procedure: ESOPHAGOGASTRODUODENOSCOPY (EGD) WITH PROPOFOL;  Surgeon: Carman ChingJames Edwards, MD;  Location: [facility];  Service: Endoscopy;  Laterality: N/A;  . INGUINAL HERNIA REPAIR Right 7618 mos old  . LAPAROSCOPIC CHOLECYSTECTOMY  09/1998  . SAVORY DILATION N/A 11/20/2014   Procedure: SAVORY DILATION;  Surgeon: Carman ChingJames Edwards, MD;  Location: [facility];  Service: Endoscopy;  Laterality: N/A;  . WISDOM TOOTH EXTRACTION  1968    There were no vitals filed for this visit.  Subjective Assessment - 05/31/18 1407    Subjective    "It's getting better everyday"  Mornings are better once I get started, but it varies a lot during the day.    Patient is accompained by:  Family member   husband   Pertinent History  cervical throracic myelopathy s/p decompression T1-T2 (surgery 04/22/18); PMH:  anxiety, arthritis, colitis, Macular degeneration    Limitations  back precautions (no bending, lifting, twisting), fall risk    Patient Stated Goals  return to driving, volunteering/helping friends, be able to travel and go out to eat    Currently in Pain?  Yes    Pain Score  1     Pain Location  Shoulder   and back   Pain Descriptors / Indicators  Aching    Pain Type  Acute pain    Pain Onset  1 to 4 weeks ago    Pain Frequency  Intermittent    Aggravating Factors   after waking up    Pain Relieving Factors  Tylenol          [facility] OT Assessment - 05/31/18 0001      Precautions   Precautions  Fall    Precaution Comments  pt/husband reports that pt is cleared of back/lifting precautions             OT Education - 05/31/18 1457    Education  Details  Initial HEP (supine cane and red theraputty)--see pt instructions    Person(s) Educated  Patient;Spouse    Methods  Explanation;Demonstration;Verbal cues;Handout    Comprehension  Verbalized understanding;Returned demonstration;Verbal cues required       OT Short Term Goals - 05/10/18 1522      OT SHORT TERM GOAL #1   Title  Pt will be independent with initial HEP.--check STG 06/21/18    Time  6    Period  Weeks    Status  New      OT SHORT TERM GOAL #2   Title  Pt will perform simple cooking task mod I observing back precautions.    Time  6    Period  Weeks    Status  New      OT SHORT TERM GOAL #3   Title  Pt will perform simple home maintenance task mod I observing back precautions.    Time  6    Period  Weeks    Status  New      OT SHORT TERM GOAL #4   Title  Pt will verbalize understanding of energy conservation strategies for ADLs/IADLs.     Time  6    Period  Weeks    Status  New      OT SHORT TERM GOAL #5   Title  Pt will report R shoulder pain less than or equal to 4/10 for ADLs.    Time  6    Period  Weeks    Status  New      Additional Short Term Goals   Additional Short Term Goals  Yes      OT SHORT TERM GOAL #6   Title  Pt will improve R dominant hand grip strength by at least 5lbs to assist with IADLs.    Time  6    Period  Weeks    Status  New        OT Long Term Goals - 05/10/18 1526      OT LONG TERM GOAL #1   Title  Pt will be independent with UE strengthening HEP.--check LTGs 08/08/18    Time  12    Period  Weeks    Status  New      OT LONG TERM GOAL #2   Title  Pt will perform mod complex cooking/cleaning tasks mod I.    Time  12    Period  Weeks    Status  New      OT LONG TERM GOAL #3   Title  Pt will be able to stand to perform IADL/leisure activity for at least 40 min consistently without rest break.    Time  12    Period  Weeks    Status  New      OT LONG TERM GOAL #4   Title  Pt will report R shoulder pain consistently less than or equal to 3/10 for ADLs/IADLs.    Time  12    Period  Weeks    Status  New            Plan - 05/31/18 1458    Clinical Impression Statement  Pt is progressing towards goals.  Pt/husband report that she does not have back precautions and is able to lift to her comfort level.  Pt reports hand pain at times due to walker use.  Pt able to return demo HEP after instruction.  Will monitor for pain with HEP.  Occupational Profile and client history currently impacting functional performance  Pt was independent prior to symptoms beginning mid November.  Pt currently unable to drive or do community/leisure activities independently and is not performing household IADLs.      Occupational performance deficits (Please refer to evaluation for details):  ADL's;IADL's;Leisure;Social Participation    Rehab Potential  Good    OT Frequency  2x / week    OT  Duration  12 weeks   +eval; however, depending on progress, may decr frequency after 4 weeks until cleared from back precautions   OT Treatment/Interventions  Self-care/ADL training;Electrical Stimulation;Therapeutic exercise;Patient/family education;Neuromuscular education;Moist Heat;Aquatic Therapy;Energy conservation;Building services engineer;Therapeutic activities;Balance training;Manual Therapy;DME and/or AE instruction;Passive range of motion;Ultrasound;Cryotherapy;Contrast Bath    Plan  review HEP prn, standing tolerance, functional reaching    Clinical Decision Making  Limited treatment options, no task modification necessary    Recommended Other Services  current with PT    Consulted and Agree with Plan of Care  Patient       Patient will benefit from skilled therapeutic intervention in order to improve the following deficits and impairments:  Decreased knowledge of use of DME, Pain, Decreased coordination, Decreased mobility, Impaired sensation, Decreased activity tolerance, Decreased endurance, Decreased range of motion, Decreased strength, Improper spinal/pelvic alignment, Decreased balance, Impaired UE functional use  Visit Diagnosis: Muscle weakness (generalized)  Unsteadiness on feet  Other abnormalities of gait and mobility    Problem List Patient Active Problem List   Diagnosis Date Noted  . Essential hypertension   . Cervical spondylosis without myelopathy   . Steroid-induced hyperglycemia   . Postoperative pain   . Hyponatremia   . Leucocytosis   . Thoracic myelopathy 04/22/2018  . ALLERGIC RHINITIS 01/19/2007    [facility] 05/31/2018, 3:06 PM  [facility] [address] [place], [place], [address] Phone: [phone]   Fax:  [phone]  Name: Cheryl Pearson MRN: [id number] Date of Birth: [birth year]   Willa Frater, OTR/L [facility] [address] [place], [place]  [address] [phone] phone [phone] 05/31/18 3:06 PM

## 2018-05-31 NOTE — Patient Instructions (Signed)
   Lie on back holding wand. Raise arms over head. Hold 5sec. Repeat 15 times per set.  Do 2 sessions per day.   ROM: Abduction - Wand   Holding wand with left hand palm up, push wand directly out to side, leading with other hand palm down, until stretch is felt. Hold 5 seconds. Repeat 15 times per set. Do 2 sessions per day. (Lying down)    Press-Up With Wand   Press wand up until elbows are straight slowly working on control, then bring back to chest. Hold 3 seconds. Repeat 10 times. Do 2 sessions per day.ROM: External Rotation - Wand (Supine)    Lie on back holding wand with elbows bent to 90. Rotate forearms over head as far as possible.  Repeat 10 times.   Do 2 sessions per day.     11. Grip Strengthening (Resistive Putty)   Squeeze putty using thumb and all fingers. Repeat 15 times. Do 2 sessions per day.   Extension (Assistive Putty)   Roll putty back and forth, being sure to use all fingertips. Repeat 3 times with each hand. Do 2 sessions per day.  Then pinch as below.   Palmar Pinch Strengthening (Resistive Putty)   Pinch putty between thumb and each fingertip in turn after rolling out

## 2018-06-01 NOTE — Therapy (Signed)
[facility] [address] [place], [place], [address] Phone: [phone]   Fax:  [phone]  Physical Therapy Treatment  Patient Details  Name: Cheryl Pearson MRN: [id number] Date of Birth: 1940-08-22 Referring Provider (PT): Faith Rogue, MD   Encounter Date: 05/31/2018  PT End of Session - 05/31/18 1533    Visit Number  6    Number of Visits  25    Date for PT Re-Evaluation  07/08/18    Authorization Type  [employer] Medicare    PT Start Time  1530    PT Stop Time  1615    PT Time Calculation (min)  45 min    Equipment Utilized During Treatment  Gait belt    Activity Tolerance  Patient tolerated treatment well;No increased pain    Behavior During Therapy  WFL for tasks assessed/performed       Past Medical History:  Diagnosis Date  . Anxiety   . Arthritis   . Colitis   . Early age-related macular degeneration   . Hernia   . Hypertension   . Infertility, female   . Menopausal symptoms   . Rosacea   . Thyroid disease 1968   hypothyroidism treated with meds for short time then all labs normal    Past Surgical History:  Procedure Laterality Date  . bone spur Left   . COLONOSCOPY  11/2011   polyp, ulcerative coliits inactive  . COLONOSCOPY  2005  . COLONOSCOPY  1970's   diagnosed with ulcerative colitis  . ESOPHAGOGASTRODUODENOSCOPY (EGD) WITH PROPOFOL N/A 11/20/2014   Procedure: ESOPHAGOGASTRODUODENOSCOPY (EGD) WITH PROPOFOL;  Surgeon: Carman Ching, MD;  Location: [facility];  Service: Endoscopy;  Laterality: N/A;  . INGUINAL HERNIA REPAIR Right 63 mos old  . LAPAROSCOPIC CHOLECYSTECTOMY  09/1998  . SAVORY DILATION N/A 11/20/2014   Procedure: SAVORY DILATION;  Surgeon: Carman Ching, MD;  Location: [facility];  Service: Endoscopy;  Laterality: N/A;  . WISDOM TOOTH EXTRACTION  1968    There were no vitals filed for this visit.  Subjective Assessment - 05/31/18 1533    Patient is accompained by:  Family  member    Pertinent History  Thoracic myelopathy, T1-2 laminectomy, arthritis, HTN,     Limitations  Lifting;Standing;Walking;House hold activities    Patient Stated Goals  To return to as much activity as possible and get return to muscles.     Currently in Pain?  No/denies    Pain Score  0-No pain            [facility] Adult PT Treatment/Exercise - 05/31/18 1537      Transfers   Transfers  Sit to Stand;Stand to Sit    Sit to Stand  5: Supervision;With upper extremity assist;With armrests;From chair/3-in-1    Stand to Sit  5: Supervision;With upper extremity assist;With armrests;To chair/3-in-1      Ambulation/Gait   Ambulation/Gait  Yes    Ambulation/Gait Assistance  5: Supervision    Ambulation/Gait Assistance Details  cues on posture, quad activation in stance for improved knee control and step placement.    Ambulation Distance (Feet)  355 Feet   x1, plus around gym   Assistive device  Rolling walker;Other (Comment)   right LE AFO   Gait Pattern  Step-through pattern;Decreased stride length;Narrow base of support;Trunk flexed    Ambulation Surface  Level;Indoor    Ramp  4: Min assist;Other (comment)   min guard assist   Ramp Details (indicate cue type and reason)  x 3 reps with RW with decr assistance needed as reps progressed    Curb  Other (comment)   min guard assist   Pre-Gait Activities  x2 reps with RW      Neuro Re-ed    Neuro Re-ed Details   for balance/NMR/strengthening: educated on and issued corner balance ex's to HEP today. Refer to note for full details. min guard assist for balance. Spouse educated on how to guard pt with ex's.         Pt's completed HEP program:  Access Code: [email or web address]  URL: [email or web address]  Date: 05/31/2018  Prepared by: Sallyanne Kuster   Exercises  Seated Long Arc Quad - 10 reps - 2 sets - 1x daily - 4x weekly  Seated Hip Flexion March with Ankle Weights - 5 reps - 3 sets - 1x daily - 4x weekly  Seated Hip Abduction  with Resistance - 10 reps - 3 sets - 1x daily - 4x weekly  Seated Hip Adduction Isometrics with Ball - 10 reps - 2 sets - 2-3 hold - 1x daily - 4x weekly  Seated Heel Raise - 10 reps - 2 sets - 1x daily - 7x weekly  Seated Toe Raise - 10 reps - 2 sets - 1x daily - 7x weekly  Standing Knee Flexion Strengthening at Chair - 5-10 reps - 3 sets - 1x daily - 4x weekly  Standing Balance with Eyes Closed on Foam - 3 reps - 1 sets - 30 hold - 1x daily - 5x weekly   Added the following today: Wide Stance with Head Rotation on Foam Pad - 10 reps - 1 sets - 1x daily - 5x weekly  Wide Stance with Head Nods on Foam Pad - 10 reps - 1 sets - 1x daily - 5x weekly  Tandem Stance in Corner - 3 reps - 1 sets - 30 hold - 1x daily - 5x weekly        PT Education - 05/31/18 1559    Education Details  added corner balance ex's to HEP today    Person(s) Educated  Patient;Spouse    Methods  Explanation;Demonstration;Verbal cues;Handout    Comprehension  Verbalized understanding;Returned demonstration;Verbal cues required;Need further instruction       PT Short Term Goals - 05/11/18 1219      PT SHORT TERM GOAL #1   Title  Patient demonstrates understanding of initial HEP including adhering to percautions. (All STGs Target Dates: 06/10/2018)    Time  1    Period  Months    Status  New    Target Date  06/10/18      PT SHORT TERM GOAL #2   Title  Patient ambulates 200' with RW & AFO with supervision.     Time  1    Period  Months    Status  New    Target Date  06/10/18      PT SHORT TERM GOAL #3   Title  Patient negotiates ramps & curbs with RW & AFO with supervision.     Time  1    Period  Months    Status  New    Target Date  06/10/18      PT SHORT TERM GOAL #4   Title  Patient able to stand 2 minutes without UE support with supervision.     Time  1    Period  Months    Status  New    Target Date  06/10/18  PT Long Term Goals - 05/11/18 1213      PT LONG TERM GOAL #1   Title   Patient verbalizes understanding of fall prevention strategies including assistive devices and any percautions that still exist. (All LTGs Target Dates: 08/05/2018)    Time  3    Period  Months    Status  New    Target Date  08/05/18      PT LONG TERM GOAL #2   Title  Patient verbalizes & demonstrates understanding of ongoing HEP / fitness plan including returning gym.     Time  3    Period  Months    Target Date  08/05/18      PT LONG TERM GOAL #3   Title  Berg Balance >36/56 to indicate lower fall risk.     Time  3    Period  Months    Target Date  08/05/18      PT LONG TERM GOAL #4   Title  Patient ambulates >300' with LRAD modified independent for community mobility.     Time  3    Period  Months    Status  New    Target Date  08/05/18      PT LONG TERM GOAL #5   Title  Patient negotiates ramps, curbs & stairs with LRAD modified independent to enable community access.     Time  3    Period  Months    Status  New    Target Date  08/05/18            Plan - 05/31/18 1534    Clinical Impression Statement  Today's skilled session initially addressed adding additional balance components to pt's HEP with no issues reported or noted in session. Remainder of session continued to address gait and barrires with RW.  Continues to need cues on posture, step length, and technique with ramps/curbs. The pt is progressing towards goals and should benefit from continued PT to progress toward unmet goals.     Rehab Potential  Good    PT Frequency  2x / week    PT Duration  12 weeks    PT Treatment/Interventions  ADLs/Self Care Home Management;Canalith Repostioning;DME Instruction;Gait training;Stair training;Functional mobility training;Therapeutic activities;Therapeutic exercise;Balance training;Neuromuscular re-education;Patient/family education;Orthotic Fit/Training;Vestibular    PT Next Visit Plan  Continue with BLE strengthening, endurance training,    PT Home Exercise Plan   [email or web address]    Consulted and Agree with Plan of Care  Patient;Family member/caregiver    Family Member Consulted  husband, Varney BilesRick Pingree       Patient will benefit from skilled therapeutic intervention in order to improve the following deficits and impairments:  Abnormal gait, Decreased activity tolerance, Decreased balance, Decreased endurance, Decreased knowledge of use of DME, Decreased knowledge of precautions, Decreased mobility, Decreased strength, Dizziness, Postural dysfunction, Pain  Visit Diagnosis: Muscle weakness (generalized)  Unsteadiness on feet  Other abnormalities of gait and mobility  Paralytic gait     Problem List Patient Active Problem List   Diagnosis Date Noted  . Essential hypertension   . Cervical spondylosis without myelopathy   . Steroid-induced hyperglycemia   . Postoperative pain   . Hyponatremia   . Leucocytosis   . Thoracic myelopathy 04/22/2018  . ALLERGIC RHINITIS 01/19/2007    Sallyanne KusterKathy , PTA, [facility] [address], [address] [place], [place] [address] [phone] 06/01/18, 3:08 PM   Name: Cheryl Pearson MRN: [id number] Date of Birth: 08/22/1940

## 2018-06-03 ENCOUNTER — Ambulatory Visit: Payer: Medicare Other | Admitting: Physical Therapy

## 2018-06-03 ENCOUNTER — Encounter: Payer: Self-pay | Admitting: Physical Therapy

## 2018-06-03 ENCOUNTER — Ambulatory Visit: Payer: Medicare Other | Admitting: Occupational Therapy

## 2018-06-03 DIAGNOSIS — M6281 Muscle weakness (generalized): Secondary | ICD-10-CM | POA: Diagnosis not present

## 2018-06-03 DIAGNOSIS — R2681 Unsteadiness on feet: Secondary | ICD-10-CM

## 2018-06-03 DIAGNOSIS — R2689 Other abnormalities of gait and mobility: Secondary | ICD-10-CM

## 2018-06-03 NOTE — Therapy (Signed)
[facility] [address] [place], [place], [address] Phone: [phone]   Fax:  [phone]  Occupational Therapy Treatment  Patient Details  Name: Cheryl Pearson MRN: [id number] Date of Birth: 07/03/1940 No data recorded  Encounter Date: 06/03/2018  OT End of Session - 06/03/18 1607    Visit Number  3    Date for OT Re-Evaluation  08/08/18    Authorization Type  [employer]--awaiting insurance verification    Authorization - Visit Number  3    Authorization - Number of Visits  10    OT Start Time  1405    OT Stop Time  1445    OT Time Calculation (min)  40 min    Activity Tolerance  Patient tolerated treatment well    Behavior During Therapy  [facility] for tasks assessed/performed       Past Medical History:  Diagnosis Date  . Anxiety   . Arthritis   . Colitis   . Early age-related macular degeneration   . Hernia   . Hypertension   . Infertility, female   . Menopausal symptoms   . Rosacea   . Thyroid disease 1968   hypothyroidism treated with meds for short time then all labs normal    Past Surgical History:  Procedure Laterality Date  . bone spur Left   . COLONOSCOPY  11/2011   polyp, ulcerative coliits inactive  . COLONOSCOPY  2005  . COLONOSCOPY  1970's   diagnosed with ulcerative colitis  . ESOPHAGOGASTRODUODENOSCOPY (EGD) WITH PROPOFOL N/A 11/20/2014   Procedure: ESOPHAGOGASTRODUODENOSCOPY (EGD) WITH PROPOFOL;  Surgeon: Carman ChingJames Edwards, MD;  Location: [facility];  Service: Endoscopy;  Laterality: N/A;  . INGUINAL HERNIA REPAIR Right 1918 mos old  . LAPAROSCOPIC CHOLECYSTECTOMY  09/1998  . SAVORY DILATION N/A 11/20/2014   Procedure: SAVORY DILATION;  Surgeon: Carman ChingJames Edwards, MD;  Location: [facility];  Service: Endoscopy;  Laterality: N/A;  . WISDOM TOOTH EXTRACTION  1968    There were no vitals filed for this visit.  Subjective Assessment - 06/03/18 1411    Subjective   Denies pain    Pertinent History  cervical throracic myelopathy s/p decompression T1-T2 (surgery 04/22/18); PMH:  anxiety, arthritis, colitis, Macular degeneration    Limitations  back precautions (no bending, lifting, twisting), fall risk    Patient Stated Goals  return to driving, volunteering/helping friends, be able to travel and go out to eat    Currently in Pain?  No/denies                Treatment:Cane exercises in seated for shoulder flexion, abduction, chest press and supine internal and external rotation, min v.c Functional reaching in standing to place/ remove graded clothespins(for sustained pinch) from vertical antennae, close supervision and min v.c for weight bearing through RLE. Pt practiced donning/ doffing shoe and AFP, pt returned demonstration following min v.c, therapist adjusted shoelaces with shoe button. Arm bike x 5 mins level 3 for conditoning, min discomfort noted after this task in right shoulder(2/10)              OT Short Term Goals - 05/10/18 1522      OT SHORT TERM GOAL #1   Title  Pt will be independent with initial HEP.--check STG 06/21/18    Time  6    Period  Weeks    Status  New      OT SHORT TERM GOAL #2   Title  Pt will perform  simple cooking task mod I observing back precautions.    Time  6    Period  Weeks    Status  New      OT SHORT TERM GOAL #3   Title  Pt will perform simple home maintenance task mod I observing back precautions.    Time  6    Period  Weeks    Status  New      OT SHORT TERM GOAL #4   Title  Pt will verbalize understanding of energy conservation strategies for ADLs/IADLs.    Time  6    Period  Weeks    Status  New      OT SHORT TERM GOAL #5   Title  Pt will report R shoulder pain less than or equal to 4/10 for ADLs.    Time  6    Period  Weeks    Status  New      Additional Short Term Goals   Additional Short Term Goals  Yes      OT SHORT TERM GOAL #6   Title  Pt will improve R dominant hand grip strength  by at least 5lbs to assist with IADLs.    Time  6    Period  Weeks    Status  New        OT Long Term Goals - 05/10/18 1526      OT LONG TERM GOAL #1   Title  Pt will be independent with UE strengthening HEP.--check LTGs 08/08/18    Time  12    Period  Weeks    Status  New      OT LONG TERM GOAL #2   Title  Pt will perform mod complex cooking/cleaning tasks mod I.    Time  12    Period  Weeks    Status  New      OT LONG TERM GOAL #3   Title  Pt will be able to stand to perform IADL/leisure activity for at least 40 min consistently without rest break.    Time  12    Period  Weeks    Status  New      OT LONG TERM GOAL #4   Title  Pt will report R shoulder pain consistently less than or equal to 3/10 for ADLs/IADLs.    Time  12    Period  Weeks    Status  New            Plan - 06/03/18 1608    Clinical Impression Statement  Pt is progressing towards goals. she demonstrates improving UE strength and standing balance.    Occupational Profile and client history currently impacting functional performance  Pt was independent prior to symptoms beginning mid November.  Pt currently unable to drive or do community/leisure activities independently and is not performing household IADLs.      Occupational performance deficits (Please refer to evaluation for details):  ADL's;IADL's;Leisure;Social Participation    Rehab Potential  Good    OT Frequency  2x / week    OT Duration  12 weeks    OT Treatment/Interventions  Self-care/ADL training;Electrical Stimulation;Therapeutic exercise;Patient/family education;Neuromuscular education;Moist Heat;Aquatic Therapy;Energy conservation;Building services engineerunctional Mobility Training;Therapeutic activities;Balance training;Manual Therapy;DME and/or AE instruction;Passive range of motion;Ultrasound;Cryotherapy;Contrast Bath    Plan   standing tolerance, functional reaching    Consulted and Agree with Plan of Care  Patient       Patient will benefit from  skilled therapeutic intervention in order to  improve the following deficits and impairments:  Decreased knowledge of use of DME, Pain, Decreased coordination, Decreased mobility, Impaired sensation, Decreased activity tolerance, Decreased endurance, Decreased range of motion, Decreased strength, Improper spinal/pelvic alignment, Decreased balance, Impaired UE functional use  Visit Diagnosis: Muscle weakness (generalized)  Unsteadiness on feet    Problem List Patient Active Problem List   Diagnosis Date Noted  . Essential hypertension   . Cervical spondylosis without myelopathy   . Steroid-induced hyperglycemia   . Postoperative pain   . Hyponatremia   . Leucocytosis   . Thoracic myelopathy 04/22/2018  . ALLERGIC RHINITIS 01/19/2007    RINE,KATHRYN 06/03/2018, 4:09 PM  [facility] [address] [place], [place], [address] Phone: [phone]   Fax:  [phone]  Name: Cheryl Pearson MRN: [id number] Date of Birth: [birth year]

## 2018-06-04 NOTE — Therapy (Signed)
[facility] [address] [place], [place], [address] Phone: [phone]   Fax:  [phone]  Physical Therapy Treatment  Patient Details  Name: Cheryl LaineLila F Okazaki MRN: [id number] Date of Birth: 01/16/1941 Referring Provider (PT): Faith RogueZachary Swartz, MD   Encounter Date: 06/03/2018  PT End of Session - 06/03/18 1324    Visit Number  7    Number of Visits  25    Date for PT Re-Evaluation  07/08/18    Authorization Type  [employer] Medicare    PT Start Time  1320    PT Stop Time  1400    PT Time Calculation (min)  40 min    Equipment Utilized During Treatment  Gait belt    Activity Tolerance  Patient tolerated treatment well;No increased pain    Behavior During Therapy  WFL for tasks assessed/performed       Past Medical History:  Diagnosis Date  . Anxiety   . Arthritis   . Colitis   . Early age-related macular degeneration   . Hernia   . Hypertension   . Infertility, female   . Menopausal symptoms   . Rosacea   . Thyroid disease 1968   hypothyroidism treated with meds for short time then all labs normal    Past Surgical History:  Procedure Laterality Date  . bone spur Left   . COLONOSCOPY  11/2011   polyp, ulcerative coliits inactive  . COLONOSCOPY  2005  . COLONOSCOPY  1970's   diagnosed with ulcerative colitis  . ESOPHAGOGASTRODUODENOSCOPY (EGD) WITH PROPOFOL N/A 11/20/2014   Procedure: ESOPHAGOGASTRODUODENOSCOPY (EGD) WITH PROPOFOL;  Surgeon: Carman ChingJames Edwards, MD;  Location: [facility];  Service: Endoscopy;  Laterality: N/A;  . INGUINAL HERNIA REPAIR Right 9118 mos old  . LAPAROSCOPIC CHOLECYSTECTOMY  09/1998  . SAVORY DILATION N/A 11/20/2014   Procedure: SAVORY DILATION;  Surgeon: Carman ChingJames Edwards, MD;  Location: [facility];  Service: Endoscopy;  Laterality: N/A;  . WISDOM TOOTH EXTRACTION  1968    There were no vitals filed for this visit.  Subjective Assessment - 06/03/18 1323    Subjective  Had a hard time with  balance HEP yesterday, does report she was tired when she tried it. No falls or pain to report.     Patient is accompained by:  Family member    Pertinent History  Thoracic myelopathy, T1-2 laminectomy, arthritis, HTN,     Limitations  Lifting;Standing;Walking;House hold activities    Patient Stated Goals  To return to as much activity as possible and get return to muscles.     Currently in Pain?  No/denies    Pain Score  0-No pain        [facility] Adult PT Treatment/Exercise - 06/03/18 1324      Transfers   Transfers  Sit to Stand;Stand to Sit    Sit to Stand  5: Supervision;With upper extremity assist;With armrests;From chair/3-in-1    Stand to Sit  5: Supervision;With upper extremity assist;With armrests;To chair/3-in-1      Ambulation/Gait   Ambulation/Gait  Yes    Ambulation/Gait Assistance  5: Supervision    Ambulation/Gait Assistance Details  cues for upright posture and equal step length with gait.     Ambulation Distance (Feet)  --   around gym with activity   Assistive device  Rolling walker;Other (Comment)    Gait Pattern  Step-through pattern;Decreased stride length;Narrow base of support;Trunk flexed    Ambulation Surface  Level;Indoor      Exercises  Exercises  Other Exercises    Other Exercises   seated at edge of mat with tall black bolster next to right LE- stepping over/back x 10 reps with emphasis on hip/knee flexion, mild posterior trunk lean to clear height needed.       Knee/Hip Exercises: Machines for Strengthening   Cybex Leg Press  bil LE 60#'s- 5 sec holds x 10 reps, manual assist to ensure right knee stability; right LE only 40#'s- 5 sec holds x 20 reps with assist to ensure knee stability.            Balance Exercises - 06/03/18 1351      Balance Exercises: Standing   Rockerboard  Anterior/posterior;Lateral;Head turns;EO;EC;30 seconds;10 reps      Balance Exercises: Standing   Rebounder Limitations  performed both ways on balance board with light  UE support progressing to no UE support: rocking board with emphasis on tall posture with EO; holding board steady with EC no head movements, progressing to EC with head movements left<>right, then up<>down. min to mod assist for balance with cues on posture/weight shifting to assist with balance.           PT Short Term Goals - 05/11/18 1219      PT SHORT TERM GOAL #1   Title  Patient demonstrates understanding of initial HEP including adhering to percautions. (All STGs Target Dates: 06/10/2018)    Time  1    Period  Months    Status  New    Target Date  06/10/18      PT SHORT TERM GOAL #2   Title  Patient ambulates 200' with RW & AFO with supervision.     Time  1    Period  Months    Status  New    Target Date  06/10/18      PT SHORT TERM GOAL #3   Title  Patient negotiates ramps & curbs with RW & AFO with supervision.     Time  1    Period  Months    Status  New    Target Date  06/10/18      PT SHORT TERM GOAL #4   Title  Patient able to stand 2 minutes without UE support with supervision.     Time  1    Period  Months    Status  New    Target Date  06/10/18        PT Long Term Goals - 05/11/18 1213      PT LONG TERM GOAL #1   Title  Patient verbalizes understanding of fall prevention strategies including assistive devices and any percautions that still exist. (All LTGs Target Dates: 08/05/2018)    Time  3    Period  Months    Status  New    Target Date  08/05/18      PT LONG TERM GOAL #2   Title  Patient verbalizes & demonstrates understanding of ongoing HEP / fitness plan including returning gym.     Time  3    Period  Months    Target Date  08/05/18      PT LONG TERM GOAL #3   Title  Berg Balance >36/56 to indicate lower fall risk.     Time  3    Period  Months    Target Date  08/05/18      PT LONG TERM GOAL #4   Title  Patient ambulates >300' with LRAD modified independent  for community mobility.     Time  3    Period  Months    Status  New     Target Date  08/05/18      PT LONG TERM GOAL #5   Title  Patient negotiates ramps, curbs & stairs with LRAD modified independent to enable community access.     Time  3    Period  Months    Status  New    Target Date  08/05/18            Plan - 06/03/18 1324    Clinical Impression Statement  Today's skilled session addressed LE strengthening and balance reactions without any issues reported in session. Reinforcement needed through out session that her right LE muscles are contraction as pt does not have sensation and therefore feels her leg is not working at all. Had pt feel  her muscles activate with multiple ex's to reinforce that they are contracting, just weak. This seemed to help her. The pt is progressing toward goals and should benefit from continued PT to progress toward unmet goals.     Rehab Potential  Good    PT Frequency  2x / week    PT Duration  12 weeks    PT Treatment/Interventions  ADLs/Self Care Home Management;Canalith Repostioning;DME Instruction;Gait training;Stair training;Functional mobility training;Therapeutic activities;Therapeutic exercise;Balance training;Neuromuscular re-education;Patient/family education;Orthotic Fit/Training;Vestibular    PT Next Visit Plan  Continue with BLE strengthening, endurance training,    PT Home Exercise Plan  [id number]    Consulted and Agree with Plan of Care  Patient;Family member/caregiver    Family Member Consulted  husband, Amarys Venters       Patient will benefit from skilled therapeutic intervention in order to improve the following deficits and impairments:  Abnormal gait, Decreased activity tolerance, Decreased balance, Decreased endurance, Decreased knowledge of use of DME, Decreased knowledge of precautions, Decreased mobility, Decreased strength, Dizziness, Postural dysfunction, Pain  Visit Diagnosis: Muscle weakness (generalized)  Unsteadiness on feet  Other abnormalities of gait and mobility     Problem  List Patient Active Problem List   Diagnosis Date Noted  . Essential hypertension   . Cervical spondylosis without myelopathy   . Steroid-induced hyperglycemia   . Postoperative pain   . Hyponatremia   . Leucocytosis   . Thoracic myelopathy 04/22/2018  . ALLERGIC RHINITIS 01/19/2007    Sallyanne Kuster, PTA, [facility] [address], [address] [place], [place] [address] [phone] 06/04/18, 5:32 PM   Name: GREIDY LOGWOOD MRN: [id number] Date of Birth: 08-Mar-1941

## 2018-06-06 ENCOUNTER — Encounter: Payer: Self-pay | Admitting: Physical Therapy

## 2018-06-06 ENCOUNTER — Ambulatory Visit: Payer: Medicare Other | Admitting: Physical Therapy

## 2018-06-06 ENCOUNTER — Ambulatory Visit: Payer: Medicare Other | Admitting: Occupational Therapy

## 2018-06-06 ENCOUNTER — Encounter: Payer: Self-pay | Admitting: Occupational Therapy

## 2018-06-06 DIAGNOSIS — R2681 Unsteadiness on feet: Secondary | ICD-10-CM

## 2018-06-06 DIAGNOSIS — M6281 Muscle weakness (generalized): Secondary | ICD-10-CM | POA: Diagnosis not present

## 2018-06-06 DIAGNOSIS — R2689 Other abnormalities of gait and mobility: Secondary | ICD-10-CM

## 2018-06-06 DIAGNOSIS — M25511 Pain in right shoulder: Secondary | ICD-10-CM

## 2018-06-06 NOTE — Therapy (Addendum)
[facility] [address] [place], [place], [address] Phone: [phone]   Fax:  [phone]  Physical Therapy Treatment  Patient Details  Name: Cheryl Pearson MRN: [id number] Date of Birth: 06-16-1940 Referring Provider (PT): Alger Simons, MD   Encounter Date: 06/06/2018  PT End of Session - 06/06/18 1320    Visit Number  8    Number of Visits  25    Date for PT Re-Evaluation  07/08/18    Authorization Type  [employer] Medicare    PT Start Time  1316    PT Stop Time  1400    PT Time Calculation (min)  44 min    Equipment Utilized During Treatment  Gait belt    Activity Tolerance  Patient tolerated treatment well;No increased pain    Behavior During Therapy  WFL for tasks assessed/performed       Past Medical History:  Diagnosis Date  . Anxiety   . Arthritis   . Colitis   . Early age-related macular degeneration   . Hernia   . Hypertension   . Infertility, female   . Menopausal symptoms   . Rosacea   . Thyroid disease 1968   hypothyroidism treated with meds for short time then all labs normal    Past Surgical History:  Procedure Laterality Date  . bone spur Left   . COLONOSCOPY  11/2011   polyp, ulcerative coliits inactive  . COLONOSCOPY  2005  . COLONOSCOPY  1970's   diagnosed with ulcerative colitis  . ESOPHAGOGASTRODUODENOSCOPY (EGD) WITH PROPOFOL N/A 11/20/2014   Procedure: ESOPHAGOGASTRODUODENOSCOPY (EGD) WITH PROPOFOL;  Surgeon: Laurence Spates, MD;  Location: [facility];  Service: Endoscopy;  Laterality: N/A;  . INGUINAL HERNIA REPAIR Right 44 mos old  . LAPAROSCOPIC CHOLECYSTECTOMY  09/1998  . SAVORY DILATION N/A 11/20/2014   Procedure: SAVORY DILATION;  Surgeon: Laurence Spates, MD;  Location: [facility];  Service: Endoscopy;  Laterality: N/A;  . WISDOM TOOTH EXTRACTION  1968    There were no vitals filed for this visit.  Subjective Assessment - 06/06/18 1319    Subjective  Reports she walked at  the grocery store on Saturday and feels she overdid it.  Was sore the next day. No falls or pain to report.     Pertinent History  Thoracic myelopathy, T1-2 laminectomy, arthritis, HTN,     Limitations  Lifting;Standing;Walking;House hold activities    Patient Stated Goals  To return to as much activity as possible and get return to muscles.     Currently in Pain?  No/denies    Pain Score  0-No pain         [facility] PT Assessment - 06/06/18 1322      Berg Balance Test   Sit to Stand  Able to stand  independently using hands    Standing Unsupported  Able to stand 2 minutes with supervision    Sitting with Back Unsupported but Feet Supported on Floor or Stool  Able to sit safely and securely 2 minutes    Stand to Sit  Controls descent by using hands    Transfers  Able to transfer safely, definite need of hands    Standing Unsupported with Eyes Closed  Able to stand 10 seconds with supervision    Standing Ubsupported with Feet Together  Able to place feet together independently and stand for 1 minute with supervision    From Standing, Reach Forward with Outstretched Arm  Can reach forward >12 cm safely (  5")   8 inches   From Standing Position, Pick up Object from [place] to pick up shoe, needs supervision    From Standing Position, Turn to Look Behind Over each Shoulder  Turn sideways only but maintains balance    Turn 360 Degrees  Needs close supervision or verbal cueing    Standing Unsupported, Alternately Place Feet on Step/Stool  Able to complete >2 steps/needs minimal assist    Standing Unsupported, One Foot in Front  Able to take small step independently and hold 30 seconds    Standing on One Leg  Able to lift leg independently and hold equal to or more than 3 seconds    Total Score  36    Berg comment:  36/56            [facility] Adult PT Treatment/Exercise - 06/06/18 1322      Transfers   Transfers  Sit to Stand;Stand to Sit    Sit to Stand  5: Supervision;With upper  extremity assist;With armrests;From chair/3-in-1    Sit to Stand Details  Verbal cues for safe use of DME/AE    Sit to Stand Details (indicate cue type and reason)  cues for safe handplacement with transfers as pt continues to want to pull up on walker    Stand to Sit  5: Supervision;With upper extremity assist;With armrests;To chair/3-in-1    Stand to Sit Details (indicate cue type and reason)  Verbal cues for safe use of DME/AE    Stand to Sit Details  cues to reach back as pt tends to hold onto walker with sitting      Ambulation/Gait   Ambulation/Gait  Yes    Ambulation/Gait Assistance  5: Supervision    Ambulation Distance (Feet)  210 Feet    Assistive device  Rolling walker;Other (Comment)   brace to right LE   Gait Pattern  Step-through pattern;Decreased stride length;Narrow base of support;Trunk flexed    Ambulation Surface  Level;Indoor    Ramp  5: Supervision    Ramp Details (indicate cue type and reason)  with RW/AFO    Curb  5: Supervision    Curb Details (indicate cue type and reason)  with RW/AFO      Self-Care   Self-Care  Other Self-Care Comments    Other Self-Care Comments   verbally reviewed current HEP. Corner balance remains the biggest challenge. Discussed that pt should start walking more every day to work on endurance- whether it's laps in the home, up/down driveway or at grocery store. Both pt and spouse verbalized understanding. Also discussed that pt should start trying to incorporate more activity into her day- standing to assist with dishes, standing to assist with folding laundry, and standing to assist with meals. Both pt and spouse verbalized understanding.                PT Short Term Goals - 06/06/18 1321      PT SHORT TERM GOAL #1   Title  Patient demonstrates understanding of initial HEP including adhering to percautions. (All STGs Target Dates: 06/10/2018)    Baseline  06/06/18: met with current HEP.     Time  --    Period  --    Status   Achieved      PT SHORT TERM GOAL #2   Title  Patient ambulates 200' with RW & AFO with supervision.     Baseline  06/06/18: met in session today    Time  --  Period  --    Status  Achieved      PT SHORT TERM GOAL #3   Title  Patient negotiates ramps & curbs with RW & AFO with supervision.     Baseline  06/06/18: met in session today    Time  --    Period  --    Status  Achieved      PT SHORT TERM GOAL #4   Title  Patient able to stand 2 minutes without UE support with supervision.     Baseline  06/06/18: met in session today    Time  --    Period  --    Status  Achieved        PT Long Term Goals - 05/11/18 1213      PT LONG TERM GOAL #1   Title  Patient verbalizes understanding of fall prevention strategies including assistive devices and any percautions that still exist. (All LTGs Target Dates: 08/05/2018)    Time  3    Period  Months    Status  New    Target Date  08/05/18      PT LONG TERM GOAL #2   Title  Patient verbalizes & demonstrates understanding of ongoing HEP / fitness plan including returning gym.     Time  3    Period  Months    Target Date  08/05/18      PT LONG TERM GOAL #3   Title  Berg Balance >36/56 to indicate lower fall risk.     Time  3    Period  Months    Target Date  08/05/18      PT LONG TERM GOAL #4   Title  Patient ambulates >300' with LRAD modified independent for community mobility.     Time  3    Period  Months    Status  New    Target Date  08/05/18      PT LONG TERM GOAL #5   Title  Patient negotiates ramps, curbs & stairs with LRAD modified independent to enable community access.     Time  3    Period  Months    Status  New    Target Date  08/05/18            Plan - 06/06/18 1320    Clinical Impression Statement  Today's skilled session focused on progress toward STGs with all goals met. Berg Balance Test reassessed with pt scoring 36/56 today, significant improvement from original score of 12/56. Primary PT to  update STGs.     Rehab Potential  Good    PT Frequency  2x / week    PT Duration  12 weeks    PT Treatment/Interventions  ADLs/Self Care Home Management;Canalith Repostioning;DME Instruction;Gait training;Stair training;Functional mobility training;Therapeutic activities;Therapeutic exercise;Balance training;Neuromuscular re-education;Patient/family education;Orthotic Fit/Training;Vestibular    PT Next Visit Plan  begin gait training with single tip cane, continue with bil LE strengthening- step ups, step downs, lunges, leg press;  continue with balance reactions/stepping strategies on compliant surfaces    PT Home Exercise Plan  [id number]    Consulted and Agree with Plan of Care  Patient;Family member/caregiver    Family Member Consulted  husband, Jizelle Conkey       Patient will benefit from skilled therapeutic intervention in order to improve the following deficits and impairments:  Abnormal gait, Decreased activity tolerance, Decreased balance, Decreased endurance, Decreased knowledge of use of DME, Decreased knowledge of precautions, Decreased mobility,  Decreased strength, Dizziness, Postural dysfunction, Pain  Visit Diagnosis: Muscle weakness (generalized)  Unsteadiness on feet  Other abnormalities of gait and mobility     Problem List Patient Active Problem List   Diagnosis Date Noted  . Essential hypertension   . Cervical spondylosis without myelopathy   . Steroid-induced hyperglycemia   . Postoperative pain   . Hyponatremia   . Leucocytosis   . Thoracic myelopathy 04/22/2018  . ALLERGIC RHINITIS 01/19/2007    Willow Ora, PTA, [facility] [address] [place], [place] [address] [phone] 06/06/18, 3:01 PM   Name: Cheryl Pearson MRN: [id number] Date of Birth: [birth year]  PT Short Term Goals - 06/09/18 0646      PT SHORT TERM GOAL #1   Title  Patient and husband demonstrate understanding of updated HEP. (All updated STGs Target Date:  07/08/2018)    Time  1    Period  Months    Status  Revised    Target Date  07/08/18      PT SHORT TERM GOAL #2   Title  Patient ambulates 100' with cane & AFO with minA.     Time  1    Period  Months    Status  New    Target Date  07/08/18      PT SHORT TERM GOAL #3   Title  Patient negotiates ramps & curbs with cane & AFO with modA.     Time  1    Period  Months    Status  Revised    Target Date  07/08/18      PT SHORT TERM GOAL #4   Title  Berg Balance >/= 30/56    Time  1    Period  Months    Status  New    Target Date  07/08/18      Jamey Reas, PT, DPT PT Specializing in [place] 06/09/18 6:51 AM Phone:  [phone]  Fax:  [phone] [facility] [address] [place], [place] [address]

## 2018-06-06 NOTE — Therapy (Signed)
[facility] [address] [place], [place], [address] Phone: [phone]   Fax:  [phone]  Occupational Therapy Treatment  Patient Details  Name: Cheryl Pearson MRN: [id number] Date of Birth: 04/02/1941 No data recorded  Encounter Date: 06/06/2018  OT End of Session - 06/06/18 1405    Visit Number  4    Date for OT Re-Evaluation  08/08/18    Authorization Type  [place] Medicare UHC--awaiting insurance verification    Authorization - Visit Number  4    Authorization - Number of Visits  10    OT Start Time  1404    OT Stop Time  1450    OT Time Calculation (min)  46 min    Activity Tolerance  Patient tolerated treatment well    Behavior During Therapy  [facility] for tasks assessed/performed       Past Medical History:  Diagnosis Date  . Anxiety   . Arthritis   . Colitis   . Early age-related macular degeneration   . Hernia   . Hypertension   . Infertility, female   . Menopausal symptoms   . Rosacea   . Thyroid disease 1968   hypothyroidism treated with meds for short time then all labs normal    Past Surgical History:  Procedure Laterality Date  . bone spur Left   . COLONOSCOPY  11/2011   polyp, ulcerative coliits inactive  . COLONOSCOPY  2005  . COLONOSCOPY  1970's   diagnosed with ulcerative colitis  . ESOPHAGOGASTRODUODENOSCOPY (EGD) WITH PROPOFOL N/A 11/20/2014   Procedure: ESOPHAGOGASTRODUODENOSCOPY (EGD) WITH PROPOFOL;  Surgeon: Carman Ching, MD;  Location: [facility];  Service: Endoscopy;  Laterality: N/A;  . INGUINAL HERNIA REPAIR Right 48 mos old  . LAPAROSCOPIC CHOLECYSTECTOMY  09/1998  . SAVORY DILATION N/A 11/20/2014   Procedure: SAVORY DILATION;  Surgeon: Carman Ching, MD;  Location: [facility];  Service: Endoscopy;  Laterality: N/A;  . WISDOM TOOTH EXTRACTION  1968    There were no vitals filed for this visit.  Subjective Assessment - 06/06/18 1405    Subjective   balance has improved     Pertinent History  cervical throracic myelopathy s/p decompression T1-T2 (surgery 04/22/18); PMH:  anxiety, arthritis, colitis, Macular degeneration    Limitations  back precautions (no bending, lifting, twisting), fall risk    Patient Stated Goals  return to driving, volunteering/helping friends, be able to travel and go out to eat    Currently in Pain?  No/denies        Attempted sitting shoulder flex BUEs with cane, but pt with incr pain and compensation at higher ranges so discontinued and performed in supine.  Supine, cane ex BUEs for shoulder flex, abduction, chest press with scapular retraction, ER behind head--min v.c for positioning.  In standing with walker, working on posture/standing balance with/without mirror incorporating anterior pelvic til as pt with hip hyperextension.   Started with 1 UE support while performing reaching to mid-level with other UE with min-mod facilitation/assist initially.  Then, standing without reaching with mirror and no UE support with min-mod facilitation initially.  Improved with repetition.  Cueing/assit (verbal/tactile) for pelvic tilt, scapular retraction, head position, R foot position, and avoid tensing neck.  Pt demo difficulty with proprioception related to posture.  In sitting, worked on postural alignment and educated pt on role of posture related to scapular/upper back/neck pain with compensation pattern.  Pt needed min-mod cueing (tactile/verbal).  Pt/husband verbalized understanding.  OT Short Term Goals - 06/06/18 1506      OT SHORT TERM GOAL #1   Title  Pt will be independent with initial HEP.--check STG 06/21/18    Time  6    Period  Weeks    Status  New      OT SHORT TERM GOAL #2   Title  Pt will perform simple cooking task mod I.    Time  6    Period  Weeks    Status  Revised   06/06/18 revised as pt no longer has back precautions     OT SHORT TERM GOAL #3   Title  Pt will perform simple home maintenance task mod  I.    Time  6    Period  Weeks    Status  New   06/06/18 revised as pt no longer has back precautions     OT SHORT TERM GOAL #4   Title  Pt will verbalize understanding of energy conservation strategies for ADLs/IADLs.    Time  6    Period  Weeks    Status  New      OT SHORT TERM GOAL #5   Title  Pt will report R shoulder pain less than or equal to 4/10 for ADLs.    Time  6    Period  Weeks    Status  New      OT SHORT TERM GOAL #6   Title  Pt will improve R dominant hand grip strength by at least 5lbs to assist with IADLs.    Time  6    Period  Weeks    Status  New        OT Long Term Goals - 05/10/18 1526      OT LONG TERM GOAL #1   Title  Pt will be independent with UE strengthening HEP.--check LTGs 08/08/18    Time  12    Period  Weeks    Status  New      OT LONG TERM GOAL #2   Title  Pt will perform mod complex cooking/cleaning tasks mod I.    Time  12    Period  Weeks    Status  New      OT LONG TERM GOAL #3   Title  Pt will be able to stand to perform IADL/leisure activity for at least 40 min consistently without rest break.    Time  12    Period  Weeks    Status  New      OT LONG TERM GOAL #4   Title  Pt will report R shoulder pain consistently less than or equal to 3/10 for ADLs/IADLs.    Time  12    Period  Weeks    Status  New            Plan - 06/06/18 1406    Clinical Impression Statement  Pt is progressing towards goals. she demonstrates improving UE strength and standing balance, but benefits from cueing for pelvic alignment in standing to for improved balance.    Occupational Profile and client history currently impacting functional performance  Pt was independent prior to symptoms beginning mid November.  Pt currently unable to drive or do community/leisure activities independently and is not performing household IADLs.      Occupational performance deficits (Please refer to evaluation for details):  ADL's;IADL's;Leisure;Social  Participation    Rehab Potential  Good    OT Frequency  2x /  week    OT Duration  12 weeks    OT Treatment/Interventions  Self-care/ADL training;Electrical Stimulation;Therapeutic exercise;Patient/family education;Neuromuscular education;Moist Heat;Aquatic Therapy;Energy conservation;Building services engineerunctional Mobility Training;Therapeutic activities;Balance training;Manual Therapy;DME and/or AE instruction;Passive range of motion;Ultrasound;Cryotherapy;Contrast Bath    Plan   standing tolerance, functional reaching    Consulted and Agree with Plan of Care  Patient       Patient will benefit from skilled therapeutic intervention in order to improve the following deficits and impairments:  Decreased knowledge of use of DME, Pain, Decreased coordination, Decreased mobility, Impaired sensation, Decreased activity tolerance, Decreased endurance, Decreased range of motion, Decreased strength, Improper spinal/pelvic alignment, Decreased balance, Impaired UE functional use  Visit Diagnosis: Muscle weakness (generalized)  Unsteadiness on feet  Other abnormalities of gait and mobility  Acute pain of right shoulder    Problem List Patient Active Problem List   Diagnosis Date Noted  . Essential hypertension   . Cervical spondylosis without myelopathy   . Steroid-induced hyperglycemia   . Postoperative pain   . Hyponatremia   . Leucocytosis   . Thoracic myelopathy 04/22/2018  . ALLERGIC RHINITIS 01/19/2007    [facility]EMAN,Cleaven Demario 06/06/2018, 3:08 PM  [facility] [address] [place], [place], [address] Phone: [phone]   Fax:  [phone]  Name: Cheryl Pearson MRN: [id number] Date of Birth: 09/19/1940   Willa FraterAngela Donette Mainwaring, OTR/L [facility] [address] [place], [place]  [id number] [phone] phone [phone] 06/06/18 3:08 PM

## 2018-06-10 ENCOUNTER — Encounter: Payer: Self-pay | Admitting: Physical Therapy

## 2018-06-10 ENCOUNTER — Ambulatory Visit: Payer: Medicare Other | Admitting: Physical Therapy

## 2018-06-10 ENCOUNTER — Ambulatory Visit: Payer: Medicare Other | Admitting: Occupational Therapy

## 2018-06-10 DIAGNOSIS — R2681 Unsteadiness on feet: Secondary | ICD-10-CM

## 2018-06-10 DIAGNOSIS — R2689 Other abnormalities of gait and mobility: Secondary | ICD-10-CM

## 2018-06-10 DIAGNOSIS — M25511 Pain in right shoulder: Secondary | ICD-10-CM

## 2018-06-10 DIAGNOSIS — M6281 Muscle weakness (generalized): Secondary | ICD-10-CM

## 2018-06-10 NOTE — Therapy (Signed)
[facility] [address] [place], [place], [address] Phone: [phone]   Fax:  [phone]  Occupational Therapy Treatment  Patient Details  Name: Cheryl Pearson MRN: [id number] Date of Birth: Aug 25, 1940 No data recorded  Encounter Date: 06/10/2018  OT End of Session - 06/10/18 1357    Visit Number  5    Number of Visits  25    Date for OT Re-Evaluation  08/08/18    Authorization Type  [employer] insurance verification    Authorization - Visit Number  5    Authorization - Number of Visits  10    OT Start Time  1150    OT Stop Time  1230    OT Time Calculation (min)  40 min       Past Medical History:  Diagnosis Date  . Anxiety   . Arthritis   . Colitis   . Early age-related macular degeneration   . Hernia   . Hypertension   . Infertility, female   . Menopausal symptoms   . Rosacea   . Thyroid disease 1968   hypothyroidism treated with meds for short time then all labs normal    Past Surgical History:  Procedure Laterality Date  . bone spur Left   . COLONOSCOPY  11/2011   polyp, ulcerative coliits inactive  . COLONOSCOPY  2005  . COLONOSCOPY  1970's   diagnosed with ulcerative colitis  . ESOPHAGOGASTRODUODENOSCOPY (EGD) WITH PROPOFOL N/A 11/20/2014   Procedure: ESOPHAGOGASTRODUODENOSCOPY (EGD) WITH PROPOFOL;  Surgeon: Carman Ching, MD;  Location: [facility];  Service: Endoscopy;  Laterality: N/A;  . INGUINAL HERNIA REPAIR Right 76 mos old  . LAPAROSCOPIC CHOLECYSTECTOMY  09/1998  . SAVORY DILATION N/A 11/20/2014   Procedure: SAVORY DILATION;  Surgeon: Carman Ching, MD;  Location: [facility];  Service: Endoscopy;  Laterality: N/A;  . WISDOM TOOTH EXTRACTION  1968    There were no vitals filed for this visit.  Subjective Assessment - 06/10/18 1400    Pertinent History  cervical throracic myelopathy s/p decompression T1-T2 (surgery 04/22/18); PMH:  anxiety, arthritis, colitis,  Macular degeneration    Patient Stated Goals  return to driving, volunteering/helping friends, be able to travel and go out to eat    Currently in Pain?  No/denies              Treatment;Reviewed cane exercises as pt reports she had pain at home after performing. Supine shoulder flexion and internal/ external rotation, min v.c for proper shoulder positioning Seated shoulder abduction, internal/ external rotation, shoulder flexion, min v.c Standing to place and retrieve items from overhead cabinets with lateral weight shifts to place ona nearby table, min-mod facilitation for weightshift to right side Gripper set at level 1 to pick up 1/2 the blocks for sustained grip with RUE, task discontinued due to pain at thumb MP joint. Reviewed red putty exercises for sustained grip and pinch, min v. c               OT Short Term Goals - 06/10/18 1401      OT SHORT TERM GOAL #1   Title  Pt will be independent with initial HEP.--check STG 06/21/18    Time  6    Period  Weeks    Status  On-going      OT SHORT TERM GOAL #2   Title  Pt will perform simple cooking task mod I.    Time  6    Period  Weeks    Status  On-going   06/06/18 revised as pt no longer has back precautions     OT SHORT TERM GOAL #3   Title  Pt will perform simple home maintenance task mod I.    Time  6    Period  Weeks    Status  On-going   06/06/18 revised as pt no longer has back precautions     OT SHORT TERM GOAL #4   Title  Pt will verbalize understanding of energy conservation strategies for ADLs/IADLs.    Time  6    Period  Weeks    Status  On-going      OT SHORT TERM GOAL #5   Title  Pt will report R shoulder pain less than or equal to 4/10 for ADLs.    Time  6    Period  Weeks    Status  On-going      OT SHORT TERM GOAL #6   Title  Pt will improve R dominant hand grip strength by at least 5lbs to assist with IADLs.    Time  6    Period  Weeks    Status  On-going        OT Long Term  Goals - 05/10/18 1526      OT LONG TERM GOAL #1   Title  Pt will be independent with UE strengthening HEP.--check LTGs 08/08/18    Time  12    Period  Weeks    Status  New      OT LONG TERM GOAL #2   Title  Pt will perform mod complex cooking/cleaning tasks mod I.    Time  12    Period  Weeks    Status  New      OT LONG TERM GOAL #3   Title  Pt will be able to stand to perform IADL/leisure activity for at least 40 min consistently without rest break.    Time  12    Period  Weeks    Status  New      OT LONG TERM GOAL #4   Title  Pt will report R shoulder pain consistently less than or equal to 3/10 for ADLs/IADLs.    Time  12    Period  Weeks    Status  New            Plan - 06/10/18 1358    Clinical Impression Statement  Pt is progressing towards goals. She demonstrates improving UE functional use. she requires v.c for weight shift through RLE.    Occupational Profile and client history currently impacting functional performance  Pt was independent prior to symptoms beginning mid November.  Pt currently unable to drive or do community/leisure activities independently and is not performing household IADLs.      Occupational performance deficits (Please refer to evaluation for details):  ADL's;IADL's;Leisure;Social Participation    Rehab Potential  Good    OT Frequency  2x / week    OT Duration  12 weeks    OT Treatment/Interventions  Self-care/ADL training;Electrical Stimulation;Therapeutic exercise;Patient/family education;Neuromuscular education;Moist Heat;Aquatic Therapy;Energy conservation;Building services engineerunctional Mobility Training;Therapeutic activities;Balance training;Manual Therapy;DME and/or AE instruction;Passive range of motion;Ultrasound;Cryotherapy;Contrast Bath    Plan   standing tolerance, functional reaching    Consulted and Agree with Plan of Care  Patient       Patient will benefit from skilled therapeutic intervention in order to improve the following deficits and  impairments:  Decreased knowledge of use of DME,  Pain, Decreased coordination, Decreased mobility, Impaired sensation, Decreased activity tolerance, Decreased endurance, Decreased range of motion, Decreased strength, Improper spinal/pelvic alignment, Decreased balance, Impaired UE functional use  Visit Diagnosis: Muscle weakness (generalized)  Unsteadiness on feet  Other abnormalities of gait and mobility  Acute pain of right shoulder    Problem List Patient Active Problem List   Diagnosis Date Noted  . Essential hypertension   . Cervical spondylosis without myelopathy   . Steroid-induced hyperglycemia   . Postoperative pain   . Hyponatremia   . Leucocytosis   . Thoracic myelopathy 04/22/2018  . ALLERGIC RHINITIS 01/19/2007    Travarus Trudo 06/10/2018, 2:01 PM  [facility] [address] [place], [place], [address] Phone: [phone]   Fax:  [phone]  Name: RONISE SANGUINO MRN: [id number] Date of Birth: May 29, 1940

## 2018-06-12 NOTE — Therapy (Signed)
[facility] [address] [place], [place], [address] Phone: [phone]   Fax:  [phone]  Physical Therapy Treatment  Patient Details  Name: Cheryl Pearson MRN: [id number] Date of Birth: [birth year] Referring Provider (PT): Faith Rogue, MD   Encounter Date: 06/10/2018   06/10/18 1106  PT Visits / Re-Eval  Visit Number 9  Number of Visits 25  Date for PT Re-Evaluation 07/08/18  Authorization  Authorization Type [employer] Medicare  PT Time Calculation  PT Start Time 1104  PT Stop Time 1145  PT Time Calculation (min) 41 min  PT - End of Session  Equipment Utilized During Treatment Gait belt  Activity Tolerance Patient tolerated treatment well;No increased pain  Behavior During Therapy WFL for tasks assessed/performed     Past Medical History:  Diagnosis Date  . Anxiety   . Arthritis   . Colitis   . Early age-related macular degeneration   . Hernia   . Hypertension   . Infertility, female   . Menopausal symptoms   . Rosacea   . Thyroid disease 1968   hypothyroidism treated with meds for short time then all labs normal    Past Surgical History:  Procedure Laterality Date  . bone spur Left   . COLONOSCOPY  11/2011   polyp, ulcerative coliits inactive  . COLONOSCOPY  2005  . COLONOSCOPY  1970's   diagnosed with ulcerative colitis  . ESOPHAGOGASTRODUODENOSCOPY (EGD) WITH PROPOFOL N/A 11/20/2014   Procedure: ESOPHAGOGASTRODUODENOSCOPY (EGD) WITH PROPOFOL;  Surgeon: Carman Ching, MD;  Location: [facility];  Service: Endoscopy;  Laterality: N/A;  . INGUINAL HERNIA REPAIR Right 87 mos old  . LAPAROSCOPIC CHOLECYSTECTOMY  09/1998  . SAVORY DILATION N/A 11/20/2014   Procedure: SAVORY DILATION;  Surgeon: Carman Ching, MD;  Location: [facility];  Service: Endoscopy;  Laterality: N/A;  . WISDOM TOOTH EXTRACTION  1968    There were no vitals filed for this visit.    06/10/18 1105  Symptoms/Limitations   Subjective Reports she may have overdid it yesterday (multiple errands and out to eat for lunch), also reports not sleeping well last night. Had some low back pain upon waking up, 2/10,  none now.   Patient is accompained by: Family member  Pertinent History Thoracic myelopathy, T1-2 laminectomy, arthritis, HTN,   Limitations Lifting;Standing;Walking;House hold activities  Patient Stated Goals To return to as much activity as possible and get return to muscles.   Pain Assessment  Currently in Pain? No/denies  Pain Score 0      06/10/18 1107  Transfers  Transfers Sit to Stand;Stand to Sit  Sit to Stand 5: Supervision;With upper extremity assist;With armrests;From chair/3-in-1  Stand to Sit 5: Supervision;With upper extremity assist;With armrests;To chair/3-in-1  Ambulation/Gait  Ambulation/Gait Yes  Ambulation/Gait Assistance 4: Min assist;3: Mod assist;5: Supervision (min/mod with cane, supervision with RW)  Ambulation/Gait Assistance Details cues for posture, step placement, weight shifting onto right leg with incr quad activation in stance and cane placement.   Ambulation Distance (Feet) 115 Feet (x1 w/cane, around gym with RW)  Assistive device Straight cane;1 person hand held assist;Rolling walker (cane with rubber quad tip and AFO)  Gait Pattern Step-through pattern;Decreased stride length;Narrow base of support;Trunk flexed  Ambulation Surface Level;Indoor  High Level Balance  High Level Balance Activities Marching forwards;Marching backwards;Tandem walking  High Level Balance Comments on floor in parallel bars: 3 laps each with cues on posture, weight shifting and ex form; after a rest break progressed to on blue mat  in parallel bars for 3 laps each with light UE support, min guard to min assist for balance, cues on form/technique and weight shifting.        PT Short Term Goals - 06/09/18 6720      PT SHORT TERM GOAL #1   Title  Patient and husband demonstrate  understanding of updated HEP. (All updated STGs Target Date: 07/08/2018)    Time  1    Period  Months    Status  Revised    Target Date  07/08/18      PT SHORT TERM GOAL #2   Title  Patient ambulates 100' with cane & AFO with minA.     Time  1    Period  Months    Status  New    Target Date  07/08/18      PT SHORT TERM GOAL #3   Title  Patient negotiates ramps & curbs with cane & AFO with modA.     Time  1    Period  Months    Status  Revised    Target Date  07/08/18      PT SHORT TERM GOAL #4   Title  Berg Balance >/= 30/56    Time  1    Period  Months    Status  New    Target Date  07/08/18        PT Long Term Goals - 05/11/18 1213      PT LONG TERM GOAL #1   Title  Patient verbalizes understanding of fall prevention strategies including assistive devices and any percautions that still exist. (All LTGs Target Dates: 08/05/2018)    Time  3    Period  Months    Status  New    Target Date  08/05/18      PT LONG TERM GOAL #2   Title  Patient verbalizes & demonstrates understanding of ongoing HEP / fitness plan including returning gym.     Time  3    Period  Months    Target Date  08/05/18      PT LONG TERM GOAL #3   Title  Berg Balance >36/56 to indicate lower fall risk.     Time  3    Period  Months    Target Date  08/05/18      PT LONG TERM GOAL #4   Title  Patient ambulates >300' with LRAD modified independent for community mobility.     Time  3    Period  Months    Status  New    Target Date  08/05/18      PT LONG TERM GOAL #5   Title  Patient negotiates ramps, curbs & stairs with LRAD modified independent to enable community access.     Time  3    Period  Months    Status  New    Target Date  08/05/18         06/10/18 1107  Plan  Clinical Impression Statement Today's skilled session initatied gait with cane with min to mod assist needed for balance. Remainder of session focused on balance reactions progressing to on complaint surfaces. The pt is  progressing toward goals and should benefit from continued PT to progress toward unmet goals.   Pt will benefit from skilled therapeutic intervention in order to improve on the following deficits Abnormal gait;Decreased activity tolerance;Decreased balance;Decreased endurance;Decreased knowledge of use of DME;Decreased knowledge of precautions;Decreased mobility;Decreased strength;Dizziness;Postural dysfunction;Pain  Rehab  Potential Good  PT Frequency 2x / week  PT Duration 12 weeks  PT Treatment/Interventions ADLs/Self Care Home Management;Canalith Repostioning;DME Instruction;Gait training;Stair training;Functional mobility training;Therapeutic activities;Therapeutic exercise;Balance training;Neuromuscular re-education;Patient/family education;Orthotic Fit/Training;Vestibular  PT Next Visit Plan 10th visit progress note due; continue with  gait training with single tip cane, continue with bil LE strengthening- step ups, step downs, lunges, leg press;  continue with balance reactions/stepping strategies on compliant surfaces  PT Home Exercise Plan [id number]  Consulted and Agree with Plan of Care Patient;Family member/caregiver  Family Member Consulted husband, Varney BilesRick Sinquefield          Patient will benefit from skilled therapeutic intervention in order to improve the following deficits and impairments:  Abnormal gait, Decreased activity tolerance, Decreased balance, Decreased endurance, Decreased knowledge of use of DME, Decreased knowledge of precautions, Decreased mobility, Decreased strength, Dizziness, Postural dysfunction, Pain  Visit Diagnosis: Muscle weakness (generalized)  Unsteadiness on feet  Other abnormalities of gait and mobility     Problem List Patient Active Problem List   Diagnosis Date Noted  . Essential hypertension   . Cervical spondylosis without myelopathy   . Steroid-induced hyperglycemia   . Postoperative pain   . Hyponatremia   . Leucocytosis   . Thoracic  myelopathy 04/22/2018  . ALLERGIC RHINITIS 01/19/2007    Sallyanne KusterKathy Farrell Broerman, PTA, [facility] [address], [address] [place], [place] [address] [phone] 06/12/18, 8:41 PM   Name: Cheryl Pearson MRN: [id number] Date of Birth: 03/03/1941

## 2018-06-14 ENCOUNTER — Ambulatory Visit: Payer: Medicare Other | Admitting: Physical Therapy

## 2018-06-14 ENCOUNTER — Ambulatory Visit: Payer: Medicare Other | Admitting: Occupational Therapy

## 2018-06-14 ENCOUNTER — Encounter: Payer: Self-pay | Admitting: Physical Therapy

## 2018-06-14 ENCOUNTER — Encounter: Payer: Self-pay | Admitting: Occupational Therapy

## 2018-06-14 DIAGNOSIS — R2689 Other abnormalities of gait and mobility: Secondary | ICD-10-CM

## 2018-06-14 DIAGNOSIS — M25511 Pain in right shoulder: Secondary | ICD-10-CM

## 2018-06-14 DIAGNOSIS — M6281 Muscle weakness (generalized): Secondary | ICD-10-CM | POA: Diagnosis not present

## 2018-06-14 DIAGNOSIS — R2681 Unsteadiness on feet: Secondary | ICD-10-CM

## 2018-06-14 NOTE — Therapy (Signed)
[facility] [address] [place], [place], [address] Phone: [phone]   Fax:  [phone]  Occupational Therapy Treatment  Patient Details  Name: SIMAYA KRAMER MRN: [id number] Date of Birth: 01/18/1941 No data recorded  Encounter Date: 06/14/2018  OT End of Session - 06/14/18 1408    Visit Number  6    Number of Visits  25    Date for OT Re-Evaluation  08/08/18    Authorization Type  [place] [employer]--awaiting insurance verification    Authorization - Visit Number  6    Authorization - Number of Visits  10    OT Start Time  1405    OT Stop Time  1445    OT Time Calculation (min)  40 min    Activity Tolerance  Patient tolerated treatment well    Behavior During Therapy  [facility] for tasks assessed/performed       Past Medical History:  Diagnosis Date  . Anxiety   . Arthritis   . Colitis   . Early age-related macular degeneration   . Hernia   . Hypertension   . Infertility, female   . Menopausal symptoms   . Rosacea   . Thyroid disease 1968   hypothyroidism treated with meds for short time then all labs normal    Past Surgical History:  Procedure Laterality Date  . bone spur Left   . COLONOSCOPY  11/2011   polyp, ulcerative coliits inactive  . COLONOSCOPY  2005  . COLONOSCOPY  1970's   diagnosed with ulcerative colitis  . ESOPHAGOGASTRODUODENOSCOPY (EGD) WITH PROPOFOL N/A 11/20/2014   Procedure: ESOPHAGOGASTRODUODENOSCOPY (EGD) WITH PROPOFOL;  Surgeon: Carman Ching, MD;  Location: [facility];  Service: Endoscopy;  Laterality: N/A;  . INGUINAL HERNIA REPAIR Right 34 mos old  . LAPAROSCOPIC CHOLECYSTECTOMY  09/1998  . SAVORY DILATION N/A 11/20/2014   Procedure: SAVORY DILATION;  Surgeon: Carman Ching, MD;  Location: [facility];  Service: Endoscopy;  Laterality: N/A;  . WISDOM TOOTH EXTRACTION  1968    There were no vitals filed for this visit.  Subjective Assessment - 06/14/18 1407    Subjective    just stiff    Pertinent History  cervical throracic myelopathy s/p decompression T1-T2 (surgery 04/22/18); PMH:  anxiety, arthritis, colitis, Macular degeneration    Patient Stated Goals  return to driving, volunteering/helping friends, be able to travel and go out to eat    Currently in Pain?  No/denies         Sitting, ball ex BUEs for shoulder flex, abduction, chest press with scapular retraction (to mid range) --min v.c for positioning.  In standing with walker, working on posture/standing balance with 1 UE support while performing reaching to mid-level with other UE with min-mod facilitation/assist initially, progressed to performing with CGA-min A/cues.  In standing at counter, lateral wt. Shifts with functional reaching with min A/cueing.  Then reaching in overhead and lower cabinets with each UE for balance with min cueing/CGA.     Picking up blocks with gripper set on level 1 (black spring) for sustained grip strength with RUE with min-mod difficulty, min cueing for posture.     OT Education - 06/14/18 1553    Education Details  Recommended pt avoid furniture walking as she to help with improved balance and postural alignment as she leans forward with R knee bent when furniture walking.   Recommended pt sit to vacuumn or use upright vacuum (vs. handheld) to vacuum with UE support.  Recommended pt perform simple IADL tasks (wiping table, dishes, swifter) in standing with UE support to work on wt. shift, posture, and activity tolerance    Person(s) Educated  Patient;Spouse    Methods  Explanation;Demonstration;Verbal cues;Handout    Comprehension  Verbalized understanding;Returned demonstration;Verbal cues required       OT Short Term Goals - 06/10/18 1401      OT SHORT TERM GOAL #1   Title  Pt will be independent with initial HEP.--check STG 06/21/18    Time  6    Period  Weeks    Status  On-going      OT SHORT TERM GOAL #2   Title  Pt will perform simple cooking task  mod I.    Time  6    Period  Weeks    Status  On-going   06/06/18 revised as pt no longer has back precautions     OT SHORT TERM GOAL #3   Title  Pt will perform simple home maintenance task mod I.    Time  6    Period  Weeks    Status  On-going   06/06/18 revised as pt no longer has back precautions     OT SHORT TERM GOAL #4   Title  Pt will verbalize understanding of energy conservation strategies for ADLs/IADLs.    Time  6    Period  Weeks    Status  On-going      OT SHORT TERM GOAL #5   Title  Pt will report R shoulder pain less than or equal to 4/10 for ADLs.    Time  6    Period  Weeks    Status  On-going      OT SHORT TERM GOAL #6   Title  Pt will improve R dominant hand grip strength by at least 5lbs to assist with IADLs.    Time  6    Period  Weeks    Status  On-going        OT Long Term Goals - 05/10/18 1526      OT LONG TERM GOAL #1   Title  Pt will be independent with UE strengthening HEP.--check LTGs 08/08/18    Time  12    Period  Weeks    Status  New      OT LONG TERM GOAL #2   Title  Pt will perform mod complex cooking/cleaning tasks mod I.    Time  12    Period  Weeks    Status  New      OT LONG TERM GOAL #3   Title  Pt will be able to stand to perform IADL/leisure activity for at least 40 min consistently without rest break.    Time  12    Period  Weeks    Status  New      OT LONG TERM GOAL #4   Title  Pt will report R shoulder pain consistently less than or equal to 3/10 for ADLs/IADLs.    Time  12    Period  Weeks    Status  New            Plan - 06/14/18 1551    Clinical Impression Statement  Pt is progressing towards goals.  Pt demo improving UE functional use and control with less compensation.  Pt continues to need cueing for balance, postural alignment with functional standing tasks and demo decr awareness of body position.    Occupational Profile  and client history currently impacting functional performance  Pt was  independent prior to symptoms beginning mid November.  Pt currently unable to drive or do community/leisure activities independently and is not performing household IADLs.      Occupational performance deficits (Please refer to evaluation for details):  ADL's;IADL's;Leisure;Social Participation    Rehab Potential  Good    OT Frequency  2x / week    OT Duration  12 weeks    OT Treatment/Interventions  Self-care/ADL training;Electrical Stimulation;Therapeutic exercise;Patient/family education;Neuromuscular education;Moist Heat;Aquatic Therapy;Energy conservation;Building services engineerunctional Mobility Training;Therapeutic activities;Balance training;Manual Therapy;DME and/or AE instruction;Passive range of motion;Ultrasound;Cryotherapy;Contrast Bath    Plan   standing tolerance, functional reaching    Consulted and Agree with Plan of Care  Patient       Patient will benefit from skilled therapeutic intervention in order to improve the following deficits and impairments:  Decreased knowledge of use of DME, Pain, Decreased coordination, Decreased mobility, Impaired sensation, Decreased activity tolerance, Decreased endurance, Decreased range of motion, Decreased strength, Improper spinal/pelvic alignment, Decreased balance, Impaired UE functional use  Visit Diagnosis: Muscle weakness (generalized)  Unsteadiness on feet  Other abnormalities of gait and mobility  Acute pain of right shoulder    Problem List Patient Active Problem List   Diagnosis Date Noted  . Essential hypertension   . Cervical spondylosis without myelopathy   . Steroid-induced hyperglycemia   . Postoperative pain   . Hyponatremia   . Leucocytosis   . Thoracic myelopathy 04/22/2018  . ALLERGIC RHINITIS 01/19/2007    [facility] HospitalFREEMAN,Sybil Shrader 06/14/2018, 3:55 PM  [facility] [address] [place], [place], [address] Phone: [phone]   Fax:  [phone]  Name: Waldo LaineLila F Gikas MRN:  [id number] Date of Birth: 12/20/1940   Willa FraterAngela Woodward Klem, OTR/L [facility] [address] [place], [place]  [address] [phone] phone [phone] 06/14/18 3:59 PM

## 2018-06-15 NOTE — Therapy (Addendum)
[facility] [address] [place], [place], [address] Phone: [phone]   Fax:  [phone]  Physical Therapy Treatment / Progress Note  Patient Details  Name: Cheryl Pearson MRN: [id number] Date of Birth: 08/09/1940 Referring Provider (PT): Faith RogueZachary Swartz, MD  Progress Note Reporting Period 05/10/2018 to 06/14/2018  See note below for Objective Data and Assessment of Progress/Goals.     Vladimir Fasterobin Waldron, PT, DPT PT Specializing in Prosthetics & Orthotics 06/16/18 6:17 AM Phone:  [phone]  Fax:  [phone] [facility] [address] [place], [place] [address]    Encounter Date: 06/14/2018   06/14/18 1322  PT Visits / Re-Eval  Visit Number 10  Number of Visits 25  Date for PT Re-Evaluation 07/08/18  Authorization  Authorization Type [employer] Medicare  PT Time Calculation  PT Start Time 1318  PT Stop Time 1400  PT Time Calculation (min) 42 min  PT - End of Session  Equipment Utilized During Treatment Gait belt  Activity Tolerance Patient tolerated treatment well;No increased pain  Behavior During Therapy WFL for tasks assessed/performed      Past Medical History:  Diagnosis Date  . Anxiety   . Arthritis   . Colitis   . Early age-related macular degeneration   . Hernia   . Hypertension   . Infertility, female   . Menopausal symptoms   . Rosacea   . Thyroid disease 1968   hypothyroidism treated with meds for short time then all labs normal    Past Surgical History:  Procedure Laterality Date  . bone spur Left   . COLONOSCOPY  11/2011   polyp, ulcerative coliits inactive  . COLONOSCOPY  2005  . COLONOSCOPY  1970's   diagnosed with ulcerative colitis  . ESOPHAGOGASTRODUODENOSCOPY (EGD) WITH PROPOFOL N/A 11/20/2014   Procedure: ESOPHAGOGASTRODUODENOSCOPY (EGD) WITH PROPOFOL;  Surgeon: Carman ChingJames Edwards, MD;  Location: [facility];  Service: Endoscopy;  Laterality: N/A;  .  INGUINAL HERNIA REPAIR Right 3718 mos old  . LAPAROSCOPIC CHOLECYSTECTOMY  09/1998  . SAVORY DILATION N/A 11/20/2014   Procedure: SAVORY DILATION;  Surgeon: Carman ChingJames Edwards, MD;  Location: [facility];  Service: Endoscopy;  Laterality: N/A;  . WISDOM TOOTH EXTRACTION  1968    There were no vitals filed for this visit.    06/14/18 1320  Symptoms/Limitations  Subjective No new complatins. No falls or pain to report. HEP is still challenging except for the clamshell with red band is getting easy. Will give them a green band today.   Patient is accompained by: Family member  Pertinent History Thoracic myelopathy, T1-2 laminectomy, arthritis, HTN,   Limitations Lifting;Standing;Walking;House hold activities  Patient Stated Goals To return to as much activity as possible and get return to muscles.   Pain Assessment  Currently in Pain? No/denies  Pain Score 0       06/14/18 1325  Transfers  Transfers Sit to Stand;Stand to Sit  Sit to Stand 5: Supervision;With upper extremity assist;With armrests;From chair/3-in-1  Stand to Sit 5: Supervision;With upper extremity assist;With armrests;To chair/3-in-1  Ambulation/Gait  Ambulation/Gait Yes  Ambulation/Gait Assistance 5: Supervision  Ambulation/Gait Assistance Details cues for equal step length, equal stance time and decreased reliance on walker with gait.   Ambulation Distance (Feet)  (into/out of and around gym)  Assistive device Rolling walker (right AFO)  Gait Pattern Step-through pattern;Decreased stride length;Narrow base of support;Trunk flexed  Ambulation Surface Level;Indoor  Knee/Hip Exercises: Machines for Strengthening  Cybex Leg Press bil LE's  80# 5 sec hold x 15 reps, then right LE 50# for 5 sec holds x 15 reps.   Knee/Hip Exercises: Standing  Lateral Step Up Right;1 set;10 reps;Hand Hold: 2;Step Height: 6"  Lateral Step Up Limitations cues for increased LE use/less UE assitance, min guard assist   Forward Step Up Right;1  set;10 reps;Hand Hold: 2;Step Height: 6";Limitations  Forward Step Up Limitations cues on form, decr UE assistance and slow, controlled movements        06/14/18 1345  Balance Exercises: Standing  SLS with Vectors Solid surface;Other reps (comment);Limitations  Balance Beam standing across red beam: alternating fwd stepping to floor/back onto beam x 8-9 reps eachs side, min guard to min assist for balance with cues on step lenght/step height and weight shifting for balance.   Other Standing Exercises seated across red beam: sit<>stands x 5 reps with emphasis on tall posture with each stand and for slow, controlled descent with sititng down. min guard to min assist needed.  Balance Exercises: Standing  SLS with Vectors Limitations attempted to use tall cones for alternating toe taps with pt unable to lift foot high enought, switched to foam bubbles on floor: alternating fwd toe taps, progressing toward alternating cross toe taps. cues on stance position, weight shifting and posture. min guard to min assist for balance.                              PT Short Term Goals - 06/09/18 8466      PT SHORT TERM GOAL #1   Title  Patient and husband demonstrate understanding of updated HEP. (All updated STGs Target Date: 07/08/2018)    Time  1    Period  Months    Status  Revised    Target Date  07/08/18      PT SHORT TERM GOAL #2   Title  Patient ambulates 100' with cane & AFO with minA.     Time  1    Period  Months    Status  New    Target Date  07/08/18      PT SHORT TERM GOAL #3   Title  Patient negotiates ramps & curbs with cane & AFO with modA.     Time  1    Period  Months    Status  Revised    Target Date  07/08/18      PT SHORT TERM GOAL #4   Title  Berg Balance >/= 30/56    Time  1    Period  Months    Status  New    Target Date  07/08/18        PT Long Term Goals - 05/11/18 1213      PT LONG TERM GOAL #1   Title  Patient verbalizes understanding of fall prevention  strategies including assistive devices and any percautions that still exist. (All LTGs Target Dates: 08/05/2018)    Time  3    Period  Months    Status  New    Target Date  08/05/18      PT LONG TERM GOAL #2   Title  Patient verbalizes & demonstrates understanding of ongoing HEP / fitness plan including returning gym.     Time  3    Period  Months    Target Date  08/05/18      PT LONG TERM GOAL #3   Title  Berg Balance >36/56 to  indicate lower fall risk.     Time  3    Period  Months    Target Date  08/05/18      PT LONG TERM GOAL #4   Title  Patient ambulates >300' with LRAD modified independent for community mobility.     Time  3    Period  Months    Status  New    Target Date  08/05/18      PT LONG TERM GOAL #5   Title  Patient negotiates ramps, curbs & stairs with LRAD modified independent to enable community access.     Time  3    Period  Months    Status  New    Target Date  08/05/18         06/14/18 1322  Plan  Clinical Impression Statement Today's skilled session continued to focus on strengthening and balance reactions with no issues reported/noted in session.  The pt continues to make progress toward goals, needs encouragement due to her not recognizing/acknowledging her progress so far. The pt should benefit from continued PT toward unmet goals.    Pt will benefit from skilled therapeutic intervention in order to improve on the following deficits Abnormal gait;Decreased activity tolerance;Decreased balance;Decreased endurance;Decreased knowledge of use of DME;Decreased knowledge of precautions;Decreased mobility;Decreased strength;Dizziness;Postural dysfunction;Pain  Rehab Potential Good  PT Frequency 2x / week  PT Duration 12 weeks  PT Treatment/Interventions ADLs/Self Care Home Management;Canalith Repostioning;DME Instruction;Gait training;Stair training;Functional mobility training;Therapeutic activities;Therapeutic exercise;Balance training;Neuromuscular  re-education;Patient/family education;Orthotic Fit/Training;Vestibular  PT Next Visit Plan continue with  gait training with single tip cane, continue with bil LE strengthening- step ups, step downs, lunges, leg press;  continue with balance reactions/stepping strategies on compliant surfaces  PT Home Exercise Plan [id number]  Consulted and Agree with Plan of Care Patient;Family member/caregiver  Family Member Consulted husband, Sharise Burgueno        Patient will benefit from skilled therapeutic intervention in order to improve the following deficits and impairments:  Abnormal gait, Decreased activity tolerance, Decreased balance, Decreased endurance, Decreased knowledge of use of DME, Decreased knowledge of precautions, Decreased mobility, Decreased strength, Dizziness, Postural dysfunction, Pain  Visit Diagnosis: Muscle weakness (generalized)  Unsteadiness on feet  Other abnormalities of gait and mobility     Problem List Patient Active Problem List   Diagnosis Date Noted  . Essential hypertension   . Cervical spondylosis without myelopathy   . Steroid-induced hyperglycemia   . Postoperative pain   . Hyponatremia   . Leucocytosis   . Thoracic myelopathy 04/22/2018  . ALLERGIC RHINITIS 01/19/2007    Sallyanne Kuster, PTA, [facility] [address] [place], [place] [address] [phone] 06/15/18, 10:30 PM   Name: Cheryl Pearson MRN: [id number] Date of Birth: March 09, 1941

## 2018-06-16 ENCOUNTER — Ambulatory Visit: Payer: Medicare Other | Admitting: Occupational Therapy

## 2018-06-16 ENCOUNTER — Ambulatory Visit: Payer: Medicare Other | Admitting: Physical Therapy

## 2018-06-16 DIAGNOSIS — R2689 Other abnormalities of gait and mobility: Secondary | ICD-10-CM

## 2018-06-16 DIAGNOSIS — M6281 Muscle weakness (generalized): Secondary | ICD-10-CM

## 2018-06-16 DIAGNOSIS — R2681 Unsteadiness on feet: Secondary | ICD-10-CM

## 2018-06-16 NOTE — Therapy (Signed)
[facility] [address] [place], [place], [address] Phone: [phone]   Fax:  [phone]  Physical Therapy Treatment  Patient Details  Name: KEANDRIA GUISTI MRN: [id number] Date of Birth: October 08, 1940 Referring Provider (PT): Faith Rogue, MD   Encounter Date: 06/16/2018  PT End of Session - 06/16/18 1428    Visit Number  11    Number of Visits  25    Date for PT Re-Evaluation  07/08/18    Authorization Type  [employer] Medicare    PT Start Time  1230    PT Stop Time  1315    PT Time Calculation (min)  45 min    Equipment Utilized During Treatment  Gait belt    Activity Tolerance  Patient tolerated treatment well;No increased pain    Behavior During Therapy  WFL for tasks assessed/performed       Past Medical History:  Diagnosis Date  . Anxiety   . Arthritis   . Colitis   . Early age-related macular degeneration   . Hernia   . Hypertension   . Infertility, female   . Menopausal symptoms   . Rosacea   . Thyroid disease 1968   hypothyroidism treated with meds for short time then all labs normal    Past Surgical History:  Procedure Laterality Date  . bone spur Left   . COLONOSCOPY  11/2011   polyp, ulcerative coliits inactive  . COLONOSCOPY  2005  . COLONOSCOPY  1970's   diagnosed with ulcerative colitis  . ESOPHAGOGASTRODUODENOSCOPY (EGD) WITH PROPOFOL N/A 11/20/2014   Procedure: ESOPHAGOGASTRODUODENOSCOPY (EGD) WITH PROPOFOL;  Surgeon: Carman Ching, MD;  Location: [facility];  Service: Endoscopy;  Laterality: N/A;  . INGUINAL HERNIA REPAIR Right 68 mos old  . LAPAROSCOPIC CHOLECYSTECTOMY  09/1998  . SAVORY DILATION N/A 11/20/2014   Procedure: SAVORY DILATION;  Surgeon: Carman Ching, MD;  Location: [facility];  Service: Endoscopy;  Laterality: N/A;  . WISDOM TOOTH EXTRACTION  1968    There were no vitals filed for this visit.  Subjective Assessment - 06/16/18 1428    Subjective  no new complaints,  ready for therapy     Patient is accompained by:  Family member    Pertinent History  Thoracic myelopathy, T1-2 laminectomy, arthritis, HTN,     Currently in Pain?  No/denies       Therex and neuro rehab performed today: gait with quad tip cane 230 ft CGA to  min A   bilat LE strengthening- Step ups and step downs X 10 ea on 6 inch step one UE support for strength then step up/down on foam pad for balance with intermitt UE support PRN X 10 bilat,   In // bars blue foam pad balance in bars for walking fwd and sideways up/down X 3, then no foam pad for march walking and retro walkin up/down  x3,  standing marches and heel toe raises X 15 each with UE support  sit to stand X 10 with one UE support.     PT Short Term Goals - 06/09/18 6440      PT SHORT TERM GOAL #1   Title  Patient and husband demonstrate understanding of updated HEP. (All updated STGs Target Date: 07/08/2018)    Time  1    Period  Months    Status  Revised    Target Date  07/08/18      PT SHORT TERM GOAL #2   Title  Patient ambulates 100'  with cane & AFO with minA.     Time  1    Period  Months    Status  New    Target Date  07/08/18      PT SHORT TERM GOAL #3   Title  Patient negotiates ramps & curbs with cane & AFO with modA.     Time  1    Period  Months    Status  Revised    Target Date  07/08/18      PT SHORT TERM GOAL #4   Title  Berg Balance >/= 30/56    Time  1    Period  Months    Status  New    Target Date  07/08/18        PT Long Term Goals - 05/11/18 1213      PT LONG TERM GOAL #1   Title  Patient verbalizes understanding of fall prevention strategies including assistive devices and any percautions that still exist. (All LTGs Target Dates: 08/05/2018)    Time  3    Period  Months    Status  New    Target Date  08/05/18      PT LONG TERM GOAL #2   Title  Patient verbalizes & demonstrates understanding of ongoing HEP / fitness plan including returning gym.     Time  3    Period   Months    Target Date  08/05/18      PT LONG TERM GOAL #3   Title  Berg Balance >36/56 to indicate lower fall risk.     Time  3    Period  Months    Target Date  08/05/18      PT LONG TERM GOAL #4   Title  Patient ambulates >300' with LRAD modified independent for community mobility.     Time  3    Period  Months    Status  New    Target Date  08/05/18      PT LONG TERM GOAL #5   Title  Patient negotiates ramps, curbs & stairs with LRAD modified independent to enable community access.     Time  3    Period  Months    Status  New    Target Date  08/05/18            Plan - 06/16/18 1429    Clinical Impression Statement  Session focused on gait with quad tip cane as well as LE strenght, endurance, and balance today. She showed good effor with PT and is motivated to work hard towards her functional goals. She was overall CGA to min A for balance training today. PT will continue to progress as able.    Rehab Potential  Good    PT Frequency  2x / week    PT Duration  12 weeks    PT Treatment/Interventions  ADLs/Self Care Home Management;Canalith Repostioning;DME Instruction;Gait training;Stair training;Functional mobility training;Therapeutic activities;Therapeutic exercise;Balance training;Neuromuscular re-education;Patient/family education;Orthotic Fit/Training;Vestibular    PT Next Visit Plan  continue with  gait training with single tip cane, continue with bil LE strengthening- step ups, step downs, lunges, leg press;  continue with balance reactions/stepping strategies on compliant surfaces    PT Home Exercise Plan  [id number]    Consulted and Agree with Plan of Care  Patient;Family member/caregiver    Family Member Consulted  husband, Reilee Hirose       Patient will benefit from skilled therapeutic intervention in order to  improve the following deficits and impairments:  Abnormal gait, Decreased activity tolerance, Decreased balance, Decreased endurance, Decreased knowledge of  use of DME, Decreased knowledge of precautions, Decreased mobility, Decreased strength, Dizziness, Postural dysfunction, Pain  Visit Diagnosis: Muscle weakness (generalized)  Unsteadiness on feet  Other abnormalities of gait and mobility     Problem List Patient Active Problem List   Diagnosis Date Noted  . Essential hypertension   . Cervical spondylosis without myelopathy   . Steroid-induced hyperglycemia   . Postoperative pain   . Hyponatremia   . Leucocytosis   . Thoracic myelopathy 04/22/2018  . ALLERGIC RHINITIS 01/19/2007    Birdie RiddleBrian R ,PT,DPT 06/16/2018, 2:31 PM  [facility] [address] [place], [place], [address] Phone: [phone]   Fax:  [phone]  Name: Waldo LaineLila F Farney MRN: [id number] Date of Birth: 08/18/1940

## 2018-06-16 NOTE — Therapy (Signed)
[facility] [address] [place], [place], [address] Phone: [phone]   Fax:  [phone]  Occupational Therapy Treatment  Patient Details  Name: Cheryl Pearson MRN: [id number] Date of Birth: Oct 01, 1940 No data recorded  Encounter Date: 06/16/2018  OT End of Session - 06/16/18 1518    Visit Number  7    Number of Visits  25    Date for OT Re-Evaluation  08/08/18    Authorization Type  [place] [employer]--awaiting insurance verification    Authorization - Visit Number  7    Authorization - Number of Visits  10    OT Start Time  1317    OT Stop Time  1400    OT Time Calculation (min)  43 min    Activity Tolerance  Patient tolerated treatment well    Behavior During Therapy  [facility] for tasks assessed/performed       Past Medical History:  Diagnosis Date  . Anxiety   . Arthritis   . Colitis   . Early age-related macular degeneration   . Hernia   . Hypertension   . Infertility, female   . Menopausal symptoms   . Rosacea   . Thyroid disease 1968   hypothyroidism treated with meds for short time then all labs normal    Past Surgical History:  Procedure Laterality Date  . bone spur Left   . COLONOSCOPY  11/2011   polyp, ulcerative coliits inactive  . COLONOSCOPY  2005  . COLONOSCOPY  1970's   diagnosed with ulcerative colitis  . ESOPHAGOGASTRODUODENOSCOPY (EGD) WITH PROPOFOL N/A 11/20/2014   Procedure: ESOPHAGOGASTRODUODENOSCOPY (EGD) WITH PROPOFOL;  Surgeon: Carman Ching, MD;  [facility]: [facility];  Service: Endoscopy;  Laterality: N/A;  . INGUINAL HERNIA REPAIR Right 58 mos old  . LAPAROSCOPIC CHOLECYSTECTOMY  09/1998  . SAVORY DILATION N/A 11/20/2014   Procedure: SAVORY DILATION;  Surgeon: Carman Ching, MD;  [facility]: [facility];  Service: Endoscopy;  Laterality: N/A;  . WISDOM TOOTH EXTRACTION  1968    There were no vitals filed for this visit.  Subjective Assessment - 06/16/18 1321    Subjective    no pain    Pertinent History  cervical throracic myelopathy s/p decompression T1-T2 (surgery 04/22/18); PMH:  anxiety, arthritis, colitis, Macular degeneration    Limitations  back precautions (no bending, lifting, twisting), fall risk    Patient Stated Goals  return to driving, volunteering/helping friends, be able to travel and go out to eat    Currently in Pain?  No/denies             Treatment:Sitting, ball ex BUEs for shoulder flex, abduction, chest press with scapular retraction (to mid range) --min v.c for positioning. Dynamic functional reaching in standing with left and right UE's with trunk rotation, from low to high surface, min v.c for postioning, min guard for balance Gripper set at level1 to stack and pick up 1 inch blocks with left and right UE's for sustained grip, and control. Ambulating to from mat with hurrycane, min A.                OT Short Term Goals - 06/10/18 1401      OT SHORT TERM GOAL #1   Title  Pt will be independent with initial HEP.--check STG 06/21/18    Time  6    Period  Weeks    Status  On-going      OT SHORT TERM GOAL #2  Title  Pt will perform simple cooking task mod I.    Time  6    Period  Weeks    Status  On-going   06/06/18 revised as pt no longer has back precautions     OT SHORT TERM GOAL #3   Title  Pt will perform simple home maintenance task mod I.    Time  6    Period  Weeks    Status  On-going   06/06/18 revised as pt no longer has back precautions     OT SHORT TERM GOAL #4   Title  Pt will verbalize understanding of energy conservation strategies for ADLs/IADLs.    Time  6    Period  Weeks    Status  On-going      OT SHORT TERM GOAL #5   Title  Pt will report R shoulder pain less than or equal to 4/10 for ADLs.    Time  6    Period  Weeks    Status  On-going      OT SHORT TERM GOAL #6   Title  Pt will improve R dominant hand grip strength by at least 5lbs to assist with IADLs.    Time  6    Period   Weeks    Status  On-going        OT Long Term Goals - 05/10/18 1526      OT LONG TERM GOAL #1   Title  Pt will be independent with UE strengthening HEP.--check LTGs 08/08/18    Time  12    Period  Weeks    Status  New      OT LONG TERM GOAL #2   Title  Pt will perform mod complex cooking/cleaning tasks mod I.    Time  12    Period  Weeks    Status  New      OT LONG TERM GOAL #3   Title  Pt will be able to stand to perform IADL/leisure activity for at least 40 min consistently without rest break.    Time  12    Period  Weeks    Status  New      OT LONG TERM GOAL #4   Title  Pt will report R shoulder pain consistently less than or equal to 3/10 for ADLs/IADLs.    Time  12    Period  Weeks    Status  New            Plan - 06/16/18 1519    Clinical Impression Statement  Pt is progressing towards goals.  Pt demo improving standing balance with functional reach.    Occupational Profile and client history currently impacting functional performance  Pt was independent prior to symptoms beginning mid November.  Pt currently unable to drive or do community/leisure activities independently and is not performing household IADLs.      Occupational performance deficits (Please refer to evaluation for details):  ADL's;IADL's;Leisure;Social Participation    Rehab Potential  Good    OT Frequency  2x / week    OT Duration  12 weeks    OT Treatment/Interventions  Self-care/ADL training;Electrical Stimulation;Therapeutic exercise;Patient/family education;Neuromuscular education;Moist Heat;Aquatic Therapy;Energy conservation;Building services engineerunctional Mobility Training;Therapeutic activities;Balance training;Manual Therapy;DME and/or AE instruction;Passive range of motion;Ultrasound;Cryotherapy;Contrast Bath    Plan   standing tolerance, functional reaching    Consulted and Agree with Plan of Care  Patient       Patient will benefit from skilled therapeutic intervention in  order to improve the  following deficits and impairments:  Decreased knowledge of use of DME, Pain, Decreased coordination, Decreased mobility, Impaired sensation, Decreased activity tolerance, Decreased endurance, Decreased range of motion, Decreased strength, Improper spinal/pelvic alignment, Decreased balance, Impaired UE functional use  Visit Diagnosis: Muscle weakness (generalized)  Unsteadiness on feet    Problem List Patient Active Problem List   Diagnosis Date Noted  . Essential hypertension   . Cervical spondylosis without myelopathy   . Steroid-induced hyperglycemia   . Postoperative pain   . Hyponatremia   . Leucocytosis   . Thoracic myelopathy 04/22/2018  . ALLERGIC RHINITIS 01/19/2007    Haniyah Maciolek 06/16/2018, 3:20 PM  [facility] [address] [place], [place], [address] Phone: [phone]   Fax:  [phone]  Name: NAVREET FORINO MRN: [id number] Date of Birth: 07/04/1940

## 2018-06-20 ENCOUNTER — Ambulatory Visit: Payer: Medicare Other | Admitting: Occupational Therapy

## 2018-06-20 ENCOUNTER — Encounter: Payer: Self-pay | Admitting: Occupational Therapy

## 2018-06-20 ENCOUNTER — Encounter: Payer: Self-pay | Admitting: Physical Therapy

## 2018-06-20 ENCOUNTER — Ambulatory Visit: Payer: Medicare Other | Admitting: Physical Therapy

## 2018-06-20 DIAGNOSIS — M6281 Muscle weakness (generalized): Secondary | ICD-10-CM

## 2018-06-20 DIAGNOSIS — R2681 Unsteadiness on feet: Secondary | ICD-10-CM

## 2018-06-20 DIAGNOSIS — M25511 Pain in right shoulder: Secondary | ICD-10-CM

## 2018-06-20 DIAGNOSIS — R2689 Other abnormalities of gait and mobility: Secondary | ICD-10-CM

## 2018-06-20 DIAGNOSIS — R261 Paralytic gait: Secondary | ICD-10-CM

## 2018-06-20 NOTE — Therapy (Signed)
[facility] [address] [place], [place], [address] Phone: [phone]   Fax:  [phone]  Occupational Therapy Treatment  Patient Details  Name: Cheryl LaineLila F Pearson MRN: [id number] Date of Birth: 05/31/1940 No data recorded  Encounter Date: 06/20/2018  OT End of Session - 06/20/18 1406    Visit Number  8    Number of Visits  25    Date for OT Re-Evaluation  08/08/18    Authorization Type  [place] [employer]--awaiting insurance verification    Authorization - Visit Number  8    Authorization - Number of Visits  10    OT Start Time  1405    OT Stop Time  1450    OT Time Calculation (min)  45 min    Activity Tolerance  Patient tolerated treatment well    Behavior During Therapy  [facility] for tasks assessed/performed       Past Medical History:  Diagnosis Date  . Anxiety   . Arthritis   . Colitis   . Early age-related macular degeneration   . Hernia   . Hypertension   . Infertility, female   . Menopausal symptoms   . Rosacea   . Thyroid disease 1968   hypothyroidism treated with meds for short time then all labs normal    Past Surgical History:  Procedure Laterality Date  . bone spur Left   . COLONOSCOPY  11/2011   polyp, ulcerative coliits inactive  . COLONOSCOPY  2005  . COLONOSCOPY  1970's   diagnosed with ulcerative colitis  . ESOPHAGOGASTRODUODENOSCOPY (EGD) WITH PROPOFOL N/A 11/20/2014   Procedure: ESOPHAGOGASTRODUODENOSCOPY (EGD) WITH PROPOFOL;  Surgeon: Carman ChingJames Edwards, MD;  Location: [facility];  Service: Endoscopy;  Laterality: N/A;  . INGUINAL HERNIA REPAIR Right 3318 mos old  . LAPAROSCOPIC CHOLECYSTECTOMY  09/1998  . SAVORY DILATION N/A 11/20/2014   Procedure: SAVORY DILATION;  Surgeon: Carman ChingJames Edwards, MD;  Location: [facility];  Service: Endoscopy;  Laterality: N/A;  . WISDOM TOOTH EXTRACTION  1968    There were no vitals filed for this visit.  Subjective Assessment - 06/20/18 1404    Subjective    Husband reports that pt was able to stand and able to chop bell pepper and getting things out of kitchen.    Pertinent History  cervical throracic myelopathy s/p decompression T1-T2 (surgery 04/22/18); PMH:  anxiety, arthritis, colitis, Macular degeneration    Limitations  back precautions (no bending, lifting, twisting), fall risk    Patient Stated Goals  return to driving, volunteering/helping friends, be able to travel and go out to eat    Currently in Pain?  No/denies        Sitting, closed chain shoulder flex, chest press, diagonals to each side with min cueing for compensation.  Ambulating with cane with CGA.  Pt with improved movement pattern when holding cane in RUE.  Recommended pt use cane in RUE as pt currently switching and furniture walking.  Reviewed reasons why pt should avoid furniture walking.  Standing at counter and performing functional reaching with lateral wt. Shift/trunk rotation with each UE with CGA (1 UE support), intermittent min v.c. for compensation at trunk.  Standing at table and folding clothes for standing balance and bilateral UE functional use for IADLs with CGA and 2 minor LOB.    Recommended against silver sneakers yoga class at this time due to compensation patterns and incr risk of injury.  Will revisit closer to d/c.  Pt verbalized understanding.  OT Education - 06/20/18 1522    Education Details  Yellow theraband HEP--see pt instructions    Person(s) Educated  Patient;Spouse    Methods  Explanation;Demonstration;Verbal cues;Handout    Comprehension  Verbalized understanding;Returned demonstration;Verbal cues required       OT Short Term Goals - 06/20/18 1524      OT SHORT TERM GOAL #1   Title  Pt will be independent with initial HEP.--check STG 06/21/18    Time  6    Period  Weeks    Status  Achieved      OT SHORT TERM GOAL #2   Title  Pt will perform simple cooking task mod I.    Baseline  06/06/18 revised as pt no longer has back  precautions.     Time  6    Period  Weeks    Status  On-going    06/20/18: not performing     OT SHORT TERM GOAL #3   Title  Pt will perform simple home maintenance task mod I.    Baseline  06/06/18 revised as pt no longer has back precautions.     Time  6    Period  Weeks    Status  On-going   06/20/18: not performing     OT SHORT TERM GOAL #4   Title  Pt will verbalize understanding of energy conservation strategies for ADLs/IADLs.    Time  6    Period  Weeks    Status  On-going      OT SHORT TERM GOAL #5   Title  Pt will report R shoulder pain less than or equal to 4/10 for ADLs.    Time  6    Period  Weeks    Status  Achieved   06/20/18     OT SHORT TERM GOAL #6   Title  Pt will improve R dominant hand grip strength by at least 5lbs to assist with IADLs.    Time  6    Period  Weeks    Status  On-going        OT Long Term Goals - 05/10/18 1526      OT LONG TERM GOAL #1   Title  Pt will be independent with UE strengthening HEP.--check LTGs 08/08/18    Time  12    Period  Weeks    Status  New      OT LONG TERM GOAL #2   Title  Pt will perform mod complex cooking/cleaning tasks mod I.    Time  12    Period  Weeks    Status  New      OT LONG TERM GOAL #3   Title  Pt will be able to stand to perform IADL/leisure activity for at least 40 min consistently without rest break.    Time  12    Period  Weeks    Status  New      OT LONG TERM GOAL #4   Title  Pt will report R shoulder pain consistently less than or equal to 3/10 for ADLs/IADLs.    Time  12    Period  Weeks    Status  New            Plan - 06/20/18 1406    Clinical Impression Statement  Pt is progressing towards goals.  Pt demo improving standing balance with functional reach.      Occupational Profile and client history currently impacting functional performance  Pt  was independent prior to symptoms beginning mid November.  Pt currently unable to drive or do community/leisure activities  independently and is not performing household IADLs.      Occupational performance deficits (Please refer to evaluation for details):  ADL's;IADL's;Leisure;Social Participation    Rehab Potential  Good    OT Frequency  2x / week    OT Duration  12 weeks    OT Treatment/Interventions  Self-care/ADL training;Electrical Stimulation;Therapeutic exercise;Patient/family education;Neuromuscular education;Moist Heat;Aquatic Therapy;Energy conservation;Building services engineer;Therapeutic activities;Balance training;Manual Therapy;DME and/or AE instruction;Passive range of motion;Ultrasound;Cryotherapy;Contrast Bath    Plan   standing tolerance, functional reaching, review yellow theraband HEP, check grip strength    Consulted and Agree with Plan of Care  Patient       Patient will benefit from skilled therapeutic intervention in order to improve the following deficits and impairments:  Decreased knowledge of use of DME, Pain, Decreased coordination, Decreased mobility, Impaired sensation, Decreased activity tolerance, Decreased endurance, Decreased range of motion, Decreased strength, Improper spinal/pelvic alignment, Decreased balance, Impaired UE functional use  Visit Diagnosis: Muscle weakness (generalized)  Unsteadiness on feet  Other abnormalities of gait and mobility  Acute pain of right shoulder    Problem List Patient Active Problem List   Diagnosis Date Noted  . Essential hypertension   . Cervical spondylosis without myelopathy   . Steroid-induced hyperglycemia   . Postoperative pain   . Hyponatremia   . Leucocytosis   . Thoracic myelopathy 04/22/2018  . ALLERGIC RHINITIS 01/19/2007    [facility] 06/20/2018, 3:32 PM  [facility] [address] [place], [place], [address] Phone: [phone]   Fax:  [phone]  Name: SHANVIKA DICKERSON MRN: [id number] Date of Birth: [birth year]   Willa Frater, OTR/L [facility] [address] [place], [place]  [address] [phone] phone [phone] 06/20/18 3:32 PM

## 2018-06-20 NOTE — Patient Instructions (Addendum)
Strengthening: Resisted Flexion   Sit, Attach tube to door.  Hold tubing with one arm at side. Pull forward and up with elbow straight. Move shoulder through pain-free range of motion, no further than shoulder height. Repeat 10-15 times per set.  Do 1-2 sessions per day.  Repeat with other arm.    Strengthening: Resisted Extension   Sit, Attach one end to door.  Hold tubing in one hand, arm forward. Pull arm back, elbow straight. Repeat 10-15 times per set. Do 1-2 sessions per day.  Repeat with other arm.   Resisted Horizontal Abduction: Bilateral   Sit or stand, tubing in both hands, palms down and arms out in front. Keeping arms straight, pinch shoulder blades together and stretch arms out.  PALMS DOWN, BELLY BUTTON LEVEL Repeat 10-15 times per set.  Do 1-2 sessions per day.   Scapular Retraction: Bilateral    Facing anchor, pull arms back, bringing shoulder blades together. Repeat 10-15 times per set. Do 2 sets per session.      **MAKE SURE YOU SIT UP AWAY FROM BACK OF CHAIR!!

## 2018-06-21 NOTE — Therapy (Signed)
[facility] [address] [place], [place], [address] Phone: [phone]   Fax:  [phone]  Physical Therapy Treatment  Patient Details  Name: Cheryl Pearson MRN: [id number] Date of Birth: 1940/09/03 Referring Provider (PT): Faith Rogue, MD   Encounter Date: 06/20/2018  PT End of Session - 06/20/18 1445    Visit Number  12    Number of Visits  25    Date for PT Re-Evaluation  07/08/18    Authorization Type  [employer] Medicare    PT Start Time  1315    PT Stop Time  1400    PT Time Calculation (min)  45 min    Equipment Utilized During Treatment  Gait belt    Activity Tolerance  Patient tolerated treatment well;No increased pain    Behavior During Therapy  WFL for tasks assessed/performed       Past Medical History:  Diagnosis Date  . Anxiety   . Arthritis   . Colitis   . Early age-related macular degeneration   . Hernia   . Hypertension   . Infertility, female   . Menopausal symptoms   . Rosacea   . Thyroid disease 1968   hypothyroidism treated with meds for short time then all labs normal    Past Surgical History:  Procedure Laterality Date  . bone spur Left   . COLONOSCOPY  11/2011   polyp, ulcerative coliits inactive  . COLONOSCOPY  2005  . COLONOSCOPY  1970's   diagnosed with ulcerative colitis  . ESOPHAGOGASTRODUODENOSCOPY (EGD) WITH PROPOFOL N/A 11/20/2014   Procedure: ESOPHAGOGASTRODUODENOSCOPY (EGD) WITH PROPOFOL;  Surgeon: Carman Ching, MD;  Location: [facility];  Service: Endoscopy;  Laterality: N/A;  . INGUINAL HERNIA REPAIR Right 44 mos old  . LAPAROSCOPIC CHOLECYSTECTOMY  09/1998  . SAVORY DILATION N/A 11/20/2014   Procedure: SAVORY DILATION;  Surgeon: Carman Ching, MD;  Location: [facility];  Service: Endoscopy;  Laterality: N/A;  . WISDOM TOOTH EXTRACTION  1968    There were no vitals filed for this visit.  Subjective Assessment - 06/20/18 1311    Subjective  No falls. She did not  go out in snow. Her muscles are slow in morning.     Patient is accompained by:  Family member    Pertinent History  Thoracic myelopathy, T1-2 laminectomy, arthritis, HTN,     Patient Stated Goals  To return to as much activity as possible and get return to muscles.     Currently in Pain?  No/denies                       [facility] Adult PT Treatment/Exercise - 06/20/18 1315      Transfers   Transfers  Sit to Stand;Stand to Sit    Sit to Stand  5: Supervision;With upper extremity assist;With armrests;From chair/3-in-1    Stand to Sit  5: Supervision;With upper extremity assist;With armrests;To chair/3-in-1      Ambulation/Gait   Ambulation/Gait  Yes    Ambulation/Gait Assistance  5: Supervision;4: Min assist;3: Mod assist   minA cane except modA with LOB,  supervision RW   Ambulation/Gait Assistance Details  tactile & verbal cues on step width, upright posture.  Loss of balance with knee buckling Left 2x & Right 1x    Ambulation Distance (Feet)  200 Feet   200' X 2 cane, RW in/out gym   Assistive device  Rolling walker;Straight cane;Other (Comment)   right AFO, cane with rubber quad  tip   Gait Pattern  Step-through pattern;Decreased stride length;Narrow base of support;Trunk flexed    Ambulation Surface  Indoor;Level    Stairs  Yes    Stairs Assistance  4: Min guard    Stair Management Technique  Two rails;Alternating pattern;Forwards    Number of Stairs  4   3 reps   Ramp  3: Mod assist   cane & AFO   Ramp Details (indicate cue type and reason)  tactile & manual cues on posture & wt shift    Curb  3: Mod assist   cane & AFO   Curb Details (indicate cue type and reason)  tactile & manual cues on posture & wt shift      Knee/Hip Exercises: Machines for Strengthening   Cybex Leg Press  bil LE's 80# 5 sec hold x 15 reps, then right LE 50# for 5 sec holds x 15 reps.       Knee/Hip Exercises: Standing   Lateral Step Up  Right;1 set;10 reps;Hand Hold: 2;Step Height: 6"     Lateral Step Up Limitations  cues for increased LE use/less UE assitance, min guard assist     Forward Step Up  Right;1 set;10 reps;Hand Hold: 2;Step Height: 6";Limitations    Forward Step Up Limitations  cues on form, decr UE assistance and slow, controlled movements              PT Education - 06/20/18 1355    Education Details  reviewed HEP -PT updated corner balance to floor eyes closed head movements 4 directions;  foam eyes open head movements; foam eyes closed static.  Tandem stance near counter intermittent support    Person(s) Educated  Patient;Spouse    Methods  Explanation;Demonstration;Tactile cues;Verbal cues    Comprehension  Verbalized understanding;Returned demonstration       PT Short Term Goals - 06/09/18 4818      PT SHORT TERM GOAL #1   Title  Patient and husband demonstrate understanding of updated HEP. (All updated STGs Target Date: 07/08/2018)    Time  1    Period  Months    Status  Revised    Target Date  07/08/18      PT SHORT TERM GOAL #2   Title  Patient ambulates 100' with cane & AFO with minA.     Time  1    Period  Months    Status  New    Target Date  07/08/18      PT SHORT TERM GOAL #3   Title  Patient negotiates ramps & curbs with cane & AFO with modA.     Time  1    Period  Months    Status  Revised    Target Date  07/08/18      PT SHORT TERM GOAL #4   Title  Berg Balance >/= 30/56    Time  1    Period  Months    Status  New    Target Date  07/08/18        PT Long Term Goals - 05/11/18 1213      PT LONG TERM GOAL #1   Title  Patient verbalizes understanding of fall prevention strategies including assistive devices and any percautions that still exist. (All LTGs Target Dates: 08/05/2018)    Time  3    Period  Months    Status  New    Target Date  08/05/18      PT LONG TERM  GOAL #2   Title  Patient verbalizes & demonstrates understanding of ongoing HEP / fitness plan including returning gym.     Time  3    Period   Months    Target Date  08/05/18      PT LONG TERM GOAL #3   Title  Berg Balance >36/56 to indicate lower fall risk.     Time  3    Period  Months    Target Date  08/05/18      PT LONG TERM GOAL #4   Title  Patient ambulates >300' with LRAD modified independent for community mobility.     Time  3    Period  Months    Status  New    Target Date  08/05/18      PT LONG TERM GOAL #5   Title  Patient negotiates ramps, curbs & stairs with LRAD modified independent to enable community access.     Time  3    Period  Months    Status  New    Target Date  08/05/18            Plan - 06/20/18 1638    Clinical Impression Statement  PT updated HEP initially verbal using previous handout but patient & husband reported difficulty with corner & tandem exercises. PT updated HEP see pt education & pt/husband verbalize better understanding. PT worked on gait with cane including barriers.     Rehab Potential  Good    PT Frequency  2x / week    PT Duration  12 weeks    PT Treatment/Interventions  ADLs/Self Care Home Management;Canalith Repostioning;DME Instruction;Gait training;Stair training;Functional mobility training;Therapeutic activities;Therapeutic exercise;Balance training;Neuromuscular re-education;Patient/family education;Orthotic Fit/Training;Vestibular    PT Next Visit Plan  continue with  gait training with single tip cane, continue with bil LE strengthening- step ups, step downs, lunges, leg press;  continue with balance reactions/stepping strategies on compliant surfaces    PT Home Exercise Plan  [id number]    Consulted and Agree with Plan of Care  Patient;Family member/caregiver    Family Member Consulted  husband, Jerzy Crotteau       Patient will benefit from skilled therapeutic intervention in order to improve the following deficits and impairments:  Abnormal gait, Decreased activity tolerance, Decreased balance, Decreased endurance, Decreased knowledge of use of DME, Decreased  knowledge of precautions, Decreased mobility, Decreased strength, Dizziness, Postural dysfunction, Pain  Visit Diagnosis: Muscle weakness (generalized)  Unsteadiness on feet  Other abnormalities of gait and mobility  Paralytic gait     Problem List Patient Active Problem List   Diagnosis Date Noted  . Essential hypertension   . Cervical spondylosis without myelopathy   . Steroid-induced hyperglycemia   . Postoperative pain   . Hyponatremia   . Leucocytosis   . Thoracic myelopathy 04/22/2018  . ALLERGIC RHINITIS 01/19/2007    Vladimir Faster PT, DPT 06/21/2018, 6:41 AM  [facility] [address] [place], [place], [address] Phone: [phone]   Fax:  [phone]  Name: SARANYA HARLIN MRN: [id number] Date of Birth: 1940-08-04

## 2018-06-23 ENCOUNTER — Ambulatory Visit: Payer: Medicare Other | Admitting: Physical Therapy

## 2018-06-23 ENCOUNTER — Encounter: Payer: Self-pay | Admitting: Occupational Therapy

## 2018-06-23 ENCOUNTER — Ambulatory Visit: Payer: Medicare Other | Admitting: Occupational Therapy

## 2018-06-23 ENCOUNTER — Encounter: Payer: Self-pay | Admitting: Physical Therapy

## 2018-06-23 DIAGNOSIS — R2689 Other abnormalities of gait and mobility: Secondary | ICD-10-CM

## 2018-06-23 DIAGNOSIS — M6281 Muscle weakness (generalized): Secondary | ICD-10-CM

## 2018-06-23 DIAGNOSIS — M25511 Pain in right shoulder: Secondary | ICD-10-CM

## 2018-06-23 DIAGNOSIS — R2681 Unsteadiness on feet: Secondary | ICD-10-CM

## 2018-06-23 NOTE — Therapy (Signed)
[facility] [address] [place], [place], [address] Phone: [phone]   Fax:  [phone]  Physical Therapy Treatment  Patient Details  Name: Cheryl Pearson MRN: [id number] Date of Birth: 1940-05-19 Referring Provider (PT): Faith Rogue, MD   Encounter Date: 06/23/2018  PT End of Session - 06/23/18 1023    Visit Number  13    Number of Visits  25    Date for PT Re-Evaluation  07/08/18    Authorization Type  [employer] Medicare    PT Start Time  1017    PT Stop Time  1056    PT Time Calculation (min)  39 min    Equipment Utilized During Treatment  Gait belt    Activity Tolerance  Patient tolerated treatment well;No increased pain    Behavior During Therapy  WFL for tasks assessed/performed       Past Medical History:  Diagnosis Date  . Anxiety   . Arthritis   . Colitis   . Early age-related macular degeneration   . Hernia   . Hypertension   . Infertility, female   . Menopausal symptoms   . Rosacea   . Thyroid disease 1968   hypothyroidism treated with meds for short time then all labs normal    Past Surgical History:  Procedure Laterality Date  . bone spur Left   . COLONOSCOPY  11/2011   polyp, ulcerative coliits inactive  . COLONOSCOPY  2005  . COLONOSCOPY  1970's   diagnosed with ulcerative colitis  . ESOPHAGOGASTRODUODENOSCOPY (EGD) WITH PROPOFOL N/A 11/20/2014   Procedure: ESOPHAGOGASTRODUODENOSCOPY (EGD) WITH PROPOFOL;  Surgeon: Carman Ching, MD;  Location: [facility];  Service: Endoscopy;  Laterality: N/A;  . INGUINAL HERNIA REPAIR Right 32 mos old  . LAPAROSCOPIC CHOLECYSTECTOMY  09/1998  . SAVORY DILATION N/A 11/20/2014   Procedure: SAVORY DILATION;  Surgeon: Carman Ching, MD;  Location: [facility];  Service: Endoscopy;  Laterality: N/A;  . WISDOM TOOTH EXTRACTION  1968    There were no vitals filed for this visit.  Subjective Assessment - 06/23/18 1022    Subjective  No new complaints. No  falls or pain to report.     Patient is accompained by:  Family member    Pertinent History  Thoracic myelopathy, T1-2 laminectomy, arthritis, HTN,     Limitations  Lifting;Standing;Walking;House hold activities    Patient Stated Goals  To return to as much activity as possible and get return to muscles.     Currently in Pain?  No/denies    Pain Score  0-No pain          [facility] Adult PT Treatment/Exercise - 06/23/18 1024      Transfers   Transfers  Sit to Stand;Stand to Sit    Sit to Stand  5: Supervision;With upper extremity assist;With armrests;From chair/3-in-1    Stand to Sit  5: Supervision;With upper extremity assist;With armrests;To chair/3-in-1      Ambulation/Gait   Ambulation/Gait  Yes    Ambulation/Gait Assistance  5: Supervision    Ambulation/Gait Assistance Details  cues for incr/equal step length and weight shifting onto right LE.     Ambulation Distance (Feet)  --   around gym with activities   Assistive device  Rolling walker   AFO on right LE   Gait Pattern  Step-through pattern;Decreased stride length;Narrow base of support;Trunk flexed    Ambulation Surface  Level;Indoor      Knee/Hip Exercises: Aerobic   Other Aerobic  Scifit  UE/LE level 2.0 with goal >/= 80 for strengtheing/activity tolerance for 6 mintues      Knee/Hip Exercises: Machines for Strengthening   Cybex Leg Press  bil LE's 80# 5 sec hold x 15 reps, then right LE 50# for 5 sec holds x 15 reps.       Knee/Hip Exercises: Standing   Lateral Step Up  Both;1 set;15 reps;Hand Hold: 2;Step Height: 4";Limitations    Lateral Step Up Limitations  in parallel bars with light UE support: manual assist for stability to right knee, none needed with left side. cues for incr use of LE"s vs UE's.     Forward Step Up  Both;1 set;15 reps;Hand Hold: 2;Step Height: 4";Limitations    Forward Step Up Limitations  cues on form, decr UE assistance and slow, controlled movements. manual stability provided for right knee,  none needed with left knee.            Balance Exercises - 06/23/18 1054      Balance Exercises: Standing   Standing Eyes Closed  Wide (BOA);Head turns;Foam/compliant surface;Other reps (comment);30 secs;Limitations      Balance Exercises: Standing   Standing Eyes Closed Limitations  on open dense blue foam with feet hip width apart: EC no head movements, progressing to EC head movements left<>right, up<>down, and diagonal both ways. min guard to min assist for balance with cues on posture/weight shifting to assist with balance recovery.            PT Short Term Goals - 06/09/18 5374      PT SHORT TERM GOAL #1   Title  Patient and husband demonstrate understanding of updated HEP. (All updated STGs Target Date: 07/08/2018)    Time  1    Period  Months    Status  Revised    Target Date  07/08/18      PT SHORT TERM GOAL #2   Title  Patient ambulates 100' with cane & AFO with minA.     Time  1    Period  Months    Status  New    Target Date  07/08/18      PT SHORT TERM GOAL #3   Title  Patient negotiates ramps & curbs with cane & AFO with modA.     Time  1    Period  Months    Status  Revised    Target Date  07/08/18      PT SHORT TERM GOAL #4   Title  Berg Balance >/= 30/56    Time  1    Period  Months    Status  New    Target Date  07/08/18        PT Long Term Goals - 05/11/18 1213      PT LONG TERM GOAL #1   Title  Patient verbalizes understanding of fall prevention strategies including assistive devices and any percautions that still exist. (All LTGs Target Dates: 08/05/2018)    Time  3    Period  Months    Status  New    Target Date  08/05/18      PT LONG TERM GOAL #2   Title  Patient verbalizes & demonstrates understanding of ongoing HEP / fitness plan including returning gym.     Time  3    Period  Months    Target Date  08/05/18      PT LONG TERM GOAL #3   Title  Berg Balance >36/56 to indicate  lower fall risk.     Time  3    Period  Months     Target Date  08/05/18      PT LONG TERM GOAL #4   Title  Patient ambulates >300' with LRAD modified independent for community mobility.     Time  3    Period  Months    Status  New    Target Date  08/05/18      PT LONG TERM GOAL #5   Title  Patient negotiates ramps, curbs & stairs with LRAD modified independent to enable community access.     Time  3    Period  Months    Status  New    Target Date  08/05/18            Plan - 06/23/18 1023    Clinical Impression Statement  Today's skilled session focused on LE strengthening and balance with vision removed with no issues reported by pt except for fatigue which was relieved with rest breaks. The pt is progressing toward goals and should benefit from continued PT to progress toward unmet goals.     Rehab Potential  Good    PT Frequency  2x / week    PT Duration  12 weeks    PT Treatment/Interventions  ADLs/Self Care Home Management;Canalith Repostioning;DME Instruction;Gait training;Stair training;Functional mobility training;Therapeutic activities;Therapeutic exercise;Balance training;Neuromuscular re-education;Patient/family education;Orthotic Fit/Training;Vestibular    PT Next Visit Plan  continue with  gait training with single tip cane, continue with bil LE strengthening- step ups, step downs, lunges, leg press;  continue with balance reactions/stepping strategies on compliant surfaces    PT Home Exercise Plan  [id number]    Consulted and Agree with Plan of Care  Patient;Family member/caregiver    Family Member Consulted  husband, Verlin Duke       Patient will benefit from skilled therapeutic intervention in order to improve the following deficits and impairments:  Abnormal gait, Decreased activity tolerance, Decreased balance, Decreased endurance, Decreased knowledge of use of DME, Decreased knowledge of precautions, Decreased mobility, Decreased strength, Dizziness, Postural dysfunction, Pain  Visit Diagnosis: Muscle weakness  (generalized)  Unsteadiness on feet  Other abnormalities of gait and mobility     Problem List Patient Active Problem List   Diagnosis Date Noted  . Essential hypertension   . Cervical spondylosis without myelopathy   . Steroid-induced hyperglycemia   . Postoperative pain   . Hyponatremia   . Leucocytosis   . Thoracic myelopathy 04/22/2018  . ALLERGIC RHINITIS 01/19/2007    Sallyanne Kuster, PTA, [facility] [address], [address] [place], [place] [address] [phone] 06/24/18, 3:44 PM   Name: Cheryl Pearson MRN: [id number] Date of Birth: [birth year]

## 2018-06-23 NOTE — Therapy (Signed)
[facility] [address] [place], [place], [address] Phone: [phone]   Fax:  [phone]  Occupational Therapy Treatment  Patient Details  Name: Cheryl Pearson MRN: [id number] Date of Birth: January 30, 1941 No data recorded  Encounter Date: 06/23/2018  OT End of Session - 06/23/18 1108    Visit Number  9    Number of Visits  25    Date for OT Re-Evaluation  08/08/18    Authorization Type  [place], no visit limit    Authorization - Visit Number  9    Authorization - Number of Visits  10    OT Start Time  1107    OT Stop Time  1145    OT Time Calculation (min)  38 min    Activity Tolerance  Patient tolerated treatment well    Behavior During Therapy  WFL for tasks assessed/performed       Past Medical History:  Diagnosis Date  . Anxiety   . Arthritis   . Colitis   . Early age-related macular degeneration   . Hernia   . Hypertension   . Infertility, female   . Menopausal symptoms   . Rosacea   . Thyroid disease 1968   hypothyroidism treated with meds for short time then all labs normal    Past Surgical History:  Procedure Laterality Date  . bone spur Left   . COLONOSCOPY  11/2011   polyp, ulcerative coliits inactive  . COLONOSCOPY  2005  . COLONOSCOPY  1970's   diagnosed with ulcerative colitis  . ESOPHAGOGASTRODUODENOSCOPY (EGD) WITH PROPOFOL N/A 11/20/2014   Procedure: ESOPHAGOGASTRODUODENOSCOPY (EGD) WITH PROPOFOL;  Surgeon: Laurence Spates, MD;  Location: [facility];  Service: Endoscopy;  Laterality: N/A;  . INGUINAL HERNIA REPAIR Right 76 mos old  . LAPAROSCOPIC CHOLECYSTECTOMY  09/1998  . SAVORY DILATION N/A 11/20/2014   Procedure: SAVORY DILATION;  Surgeon: Laurence Spates, MD;  Location: [facility];  Service: Endoscopy;  Laterality: N/A;  . WISDOM TOOTH EXTRACTION  1968    There were no vitals filed for this visit.  Subjective Assessment - 06/23/18 1108    Subjective   Pt reports  that she has washed dishes and taken things from kitchen to table    Pertinent History  cervical throracic myelopathy s/p decompression T1-T2 (surgery 04/22/18); PMH:  anxiety, arthritis, colitis, Macular degeneration    Limitations  back precautions (no bending, lifting, twisting), fall risk    Patient Stated Goals  return to driving, volunteering/helping friends, be able to travel and go out to eat    Currently in Pain?  No/denies       Sitting on ball for incr core stability while performing closed-chain shoulder flex, chest press, diagonals with min cueing for trunk control/wt shift and shoulder hike.  CGA  In standing unsupported with CGA, functional reaching overhead for incr standing balance and activity tolerance.  In standing at counter with UE support, functional step and reach to each side with CGA and mod cueing for technique/posture for improved wt. Shift and balance.     Discussed/educated pt in purpose of therapy activities in relation to function and discussed functional progress.   OT Short Term Goals - 06/23/18 1240      OT SHORT TERM GOAL #1   Title  Pt will be independent with initial HEP.--check STG 06/21/18    Time  6    Period  Weeks    Status  Achieved  OT SHORT TERM GOAL #2   Title  Pt will perform simple cooking task mod I.    Baseline  06/06/18 revised as pt no longer has back precautions.     Time  6    Period  Weeks    Status  On-going    06/20/18: not performing     OT SHORT TERM GOAL #3   Title  Pt will perform simple home maintenance task mod I.    Baseline  06/06/18 revised as pt no longer has back precautions.     Time  6    Period  Weeks    Status  On-going   06/20/18: not performing.  06/23/18:  only washing dishes, picking up a little     OT SHORT TERM GOAL #4   Title  Pt will verbalize understanding of energy conservation strategies for ADLs/IADLs.    Time  6    Period  Weeks    Status  On-going   06/23/18:  not fully met     OT SHORT  TERM GOAL #5   Title  Pt will report R shoulder pain less than or equal to 4/10 for ADLs.    Time  6    Period  Weeks    Status  Achieved   06/20/18     OT SHORT TERM GOAL #6   Title  Pt will improve R dominant hand grip strength by at least 5lbs to assist with IADLs.    Time  6    Period  Weeks    Status  On-going        OT Long Term Goals - 05/10/18 1526      OT LONG TERM GOAL #1   Title  Pt will be independent with UE strengthening HEP.--check LTGs 08/08/18    Time  12    Period  Weeks    Status  New      OT LONG TERM GOAL #2   Title  Pt will perform mod complex cooking/cleaning tasks mod I.    Time  12    Period  Weeks    Status  New      OT LONG TERM GOAL #3   Title  Pt will be able to stand to perform IADL/leisure activity for at least 40 min consistently without rest break.    Time  12    Period  Weeks    Status  New      OT LONG TERM GOAL #4   Title  Pt will report R shoulder pain consistently less than or equal to 3/10 for ADLs/IADLs.    Time  12    Period  Weeks    Status  New            Plan - 06/23/18 1109    Clinical Impression Statement  Pt is progressing towards goals.  Pt demo improving standing balance with functional reach for IADL performance.  Pt also demo improved pain and activity tolerance.    Occupational Profile and client history currently impacting functional performance  Pt was independent prior to symptoms beginning mid November.  Pt currently unable to drive or do community/leisure activities independently and is not performing household IADLs.      Occupational performance deficits (Please refer to evaluation for details):  ADL's;IADL's;Leisure;Social Participation    Rehab Potential  Good    OT Frequency  2x / week    OT Duration  12 weeks    OT Treatment/Interventions  Self-care/ADL  training;Electrical Stimulation;Therapeutic exercise;Patient/family education;Neuromuscular education;Moist Heat;Aquatic Therapy;Energy  conservation;Therapist, nutritional;Therapeutic activities;Balance training;Manual Therapy;DME and/or AE instruction;Passive range of motion;Ultrasound;Cryotherapy;Contrast Bath    Plan   standing tolerance, functional reaching, review yellow theraband HEP, check grip strength    Consulted and Agree with Plan of Care  Patient       Patient will benefit from skilled therapeutic intervention in order to improve the following deficits and impairments:  Decreased knowledge of use of DME, Pain, Decreased coordination, Decreased mobility, Impaired sensation, Decreased activity tolerance, Decreased endurance, Decreased range of motion, Decreased strength, Improper spinal/pelvic alignment, Decreased balance, Impaired UE functional use  Visit Diagnosis: Muscle weakness (generalized)  Unsteadiness on feet  Other abnormalities of gait and mobility  Acute pain of right shoulder    Problem List Patient Active Problem List   Diagnosis Date Noted  . Essential hypertension   . Cervical spondylosis without myelopathy   . Steroid-induced hyperglycemia   . Postoperative pain   . Hyponatremia   . Leucocytosis   . Thoracic myelopathy 04/22/2018  . ALLERGIC RHINITIS 01/19/2007    [facility] 06/23/2018, 12:43 PM  [facility] [address] [place], [place], [address] Phone: [phone]   Fax:  [phone]  Name: Cheryl Pearson MRN: [id number] Date of Birth: [birth year]   Vianne Bulls, OTR/L [facility] [address] [place], [place]  [address] [phone] phone [phone] 06/23/18 12:43 PM

## 2018-06-28 ENCOUNTER — Encounter: Payer: Self-pay | Admitting: Occupational Therapy

## 2018-06-28 ENCOUNTER — Encounter: Payer: Self-pay | Admitting: Physical Therapy

## 2018-06-28 ENCOUNTER — Ambulatory Visit: Payer: Medicare Other | Attending: Physical Medicine & Rehabilitation | Admitting: Physical Therapy

## 2018-06-28 ENCOUNTER — Ambulatory Visit: Payer: Medicare Other | Admitting: Occupational Therapy

## 2018-06-28 DIAGNOSIS — M25511 Pain in right shoulder: Secondary | ICD-10-CM | POA: Insufficient documentation

## 2018-06-28 DIAGNOSIS — R2689 Other abnormalities of gait and mobility: Secondary | ICD-10-CM | POA: Insufficient documentation

## 2018-06-28 DIAGNOSIS — R2681 Unsteadiness on feet: Secondary | ICD-10-CM | POA: Diagnosis present

## 2018-06-28 DIAGNOSIS — M6281 Muscle weakness (generalized): Secondary | ICD-10-CM | POA: Diagnosis not present

## 2018-06-28 NOTE — Therapy (Signed)
[facility] [address] [place], [place], [address] Phone: [phone]   Fax:  [phone]  Physical Therapy Treatment  Patient Details  Name: Cheryl Pearson MRN: [id number] Date of Birth: Oct 02, 1940 Referring Provider (PT): Faith Rogue, MD   Encounter Date: 06/28/2018  PT End of Session - 06/28/18 1322    Visit Number  14    Number of Visits  25    Date for PT Re-Evaluation  07/08/18    Authorization Type  [employer] Medicare    PT Start Time  1319    PT Stop Time  1400    PT Time Calculation (min)  41 min    Equipment Utilized During Treatment  Gait belt    Activity Tolerance  Patient tolerated treatment well;No increased pain    Behavior During Therapy  WFL for tasks assessed/performed       Past Medical History:  Diagnosis Date  . Anxiety   . Arthritis   . Colitis   . Early age-related macular degeneration   . Hernia   . Hypertension   . Infertility, female   . Menopausal symptoms   . Rosacea   . Thyroid disease 1968   hypothyroidism treated with meds for short time then all labs normal    Past Surgical History:  Procedure Laterality Date  . bone spur Left   . COLONOSCOPY  11/2011   polyp, ulcerative coliits inactive  . COLONOSCOPY  2005  . COLONOSCOPY  1970's   diagnosed with ulcerative colitis  . ESOPHAGOGASTRODUODENOSCOPY (EGD) WITH PROPOFOL N/A 11/20/2014   Procedure: ESOPHAGOGASTRODUODENOSCOPY (EGD) WITH PROPOFOL;  Surgeon: Carman Ching, MD;  Location: [facility];  Service: Endoscopy;  Laterality: N/A;  . INGUINAL HERNIA REPAIR Right 34 mos old  . LAPAROSCOPIC CHOLECYSTECTOMY  09/1998  . SAVORY DILATION N/A 11/20/2014   Procedure: SAVORY DILATION;  Surgeon: Carman Ching, MD;  Location: [facility];  Service: Endoscopy;  Laterality: N/A;  . WISDOM TOOTH EXTRACTION  1968    There were no vitals filed for this visit.  Subjective Assessment - 06/28/18 1321    Subjective  No new complaints. No  falls. Does report she was off balance with corner balance ex's last night, more than usual. Unsure of why. No pain. One episode of left knee buckling with using cane going to the car. Several other episodes of near buckling that she feels and "catches it before it happens".     Patient is accompained by:  Family member    Pertinent History  Thoracic myelopathy, T1-2 laminectomy, arthritis, HTN,     Limitations  Lifting;Standing;Walking;House hold activities    Patient Stated Goals  To return to as much activity as possible and get return to muscles.     Currently in Pain?  No/denies    Pain Score  0-No pain           [facility] Adult PT Treatment/Exercise - 06/28/18 1323      Transfers   Transfers  Sit to Stand;Stand to Sit    Sit to Stand  5: Supervision;With upper extremity assist;With armrests;From chair/3-in-1    Stand to Sit  5: Supervision;With upper extremity assist;With armrests;To chair/3-in-1      Ambulation/Gait   Ambulation/Gait  Yes    Ambulation/Gait Assistance  5: Supervision;4: Min guard;4: Min assist    Ambulation/Gait Assistance Details  with RW: cues for decr UE reliance, incr step length and to ensure quad activation in stance phase.     Ambulation Distance (  Feet)  60 Feet   x2 with RW; around gym with cane   Assistive device  Rolling walker;Straight cane   AFO on right LE; cane with rubber quad tip   Gait Pattern  Step-through pattern;Decreased stride length;Narrow base of support;Trunk flexed    Ambulation Surface  Level;Indoor      Knee/Hip Exercises: Aerobic   Other Aerobic  Scifit UE/LE level 2.5 with goal >/= 60 for strengtheing/activity tolerance for 6 mintues      Knee/Hip Exercises: Machines for Strengthening   Cybex Leg Press  bil LE's 80# 5 sec hold x 10, then 85# with 5 sec holds x10 reps, then right LE 50# for 5 sec holds x 20 reps.       Knee/Hip Exercises: Standing   Forward Lunges  Both;1 set;10 reps;Limitations    Forward Lunges Limitations   with light UE support on parallel bars: alternating fwd lunges with cues on form, weight shifting and technique, cues for light support on bars.           Balance Exercises - 06/28/18 1354      Balance Exercises: Standing   Balance Beam  standing across blue foam beam: alternating fwd heel taps to floor/back onto beam, then alternating bwd toe taps to floor/back onto beam. 10 reps each with cues on step length/step height. min guard assist for balance.    Tandem Gait  Forward;Retro;Upper extremity support;Foam/compliant surface;3 reps;Limitations    Sidestepping  Foam/compliant support;3 reps;Limitations      Balance Exercises: Standing   Tandem Gait Limitations  on blue foam balance beam: 3 laps each ways fwd/bwd, light UE support on bars with cues for step placement and weight shifting for balance. min guard to min assist for balance.     Sidestepping Limitations  on blue foam beam: 3 laps each with cues on lifting foot with each step, posture and weight shifitng. light bil UE support on bars with min guard to min assist for balance.           PT Short Term Goals - 06/09/18 1610      PT SHORT TERM GOAL #1   Title  Patient and husband demonstrate understanding of updated HEP. (All updated STGs Target Date: 07/08/2018)    Time  1    Period  Months    Status  Revised    Target Date  07/08/18      PT SHORT TERM GOAL #2   Title  Patient ambulates 100' with cane & AFO with minA.     Time  1    Period  Months    Status  New    Target Date  07/08/18      PT SHORT TERM GOAL #3   Title  Patient negotiates ramps & curbs with cane & AFO with modA.     Time  1    Period  Months    Status  Revised    Target Date  07/08/18      PT SHORT TERM GOAL #4   Title  Berg Balance >/= 30/56    Time  1    Period  Months    Status  New    Target Date  07/08/18        PT Long Term Goals - 05/11/18 1213      PT LONG TERM GOAL #1   Title  Patient verbalizes understanding of fall  prevention strategies including assistive devices and any percautions that still exist. (All LTGs  Target Dates: 08/05/2018)    Time  3    Period  Months    Status  New    Target Date  08/05/18      PT LONG TERM GOAL #2   Title  Patient verbalizes & demonstrates understanding of ongoing HEP / fitness plan including returning gym.     Time  3    Period  Months    Target Date  08/05/18      PT LONG TERM GOAL #3   Title  Berg Balance >36/56 to indicate lower fall risk.     Time  3    Period  Months    Target Date  08/05/18      PT LONG TERM GOAL #4   Title  Patient ambulates >300' with LRAD modified independent for community mobility.     Time  3    Period  Months    Status  New    Target Date  08/05/18      PT LONG TERM GOAL #5   Title  Patient negotiates ramps, curbs & stairs with LRAD modified independent to enable community access.     Time  3    Period  Months    Status  New    Target Date  08/05/18            Plan - 06/28/18 1322    Clinical Impression Statement  Today's skilled session continued to focus on LE strengthening, balance and short distance gait with cane. No buckling noted today with gait. The pt is progressing toward goals and should benefit from continued PT to progress toward unmet goals.    Rehab Potential  Good    PT Frequency  2x / week    PT Duration  12 weeks    PT Treatment/Interventions  ADLs/Self Care Home Management;Canalith Repostioning;DME Instruction;Gait training;Stair training;Functional mobility training;Therapeutic activities;Therapeutic exercise;Balance training;Neuromuscular re-education;Patient/family education;Orthotic Fit/Training;Vestibular    PT Next Visit Plan  continue with  gait training with single tip cane, continue with bil LE strengthening- step ups, step downs, lunges, leg press;  continue with balance reactions/stepping strategies on compliant surfaces    PT Home Exercise Plan  [id number]    Consulted and Agree with Plan of  Care  Patient;Family member/caregiver    Family Member Consulted  husband, Tymia Buche       Patient will benefit from skilled therapeutic intervention in order to improve the following deficits and impairments:  Abnormal gait, Decreased activity tolerance, Decreased balance, Decreased endurance, Decreased knowledge of use of DME, Decreased knowledge of precautions, Decreased mobility, Decreased strength, Dizziness, Postural dysfunction, Pain  Visit Diagnosis: Muscle weakness (generalized)  Unsteadiness on feet  Other abnormalities of gait and mobility     Problem List Patient Active Problem List   Diagnosis Date Noted  . Essential hypertension   . Cervical spondylosis without myelopathy   . Steroid-induced hyperglycemia   . Postoperative pain   . Hyponatremia   . Leucocytosis   . Thoracic myelopathy 04/22/2018  . ALLERGIC RHINITIS 01/19/2007    Sallyanne Kuster, PTA, [facility] [address], [address] [place], [place] [address] [phone] 06/28/18, 10:57 PM   Name: Cheryl Pearson MRN: [id number] Date of Birth: 03-13-1941

## 2018-06-28 NOTE — Therapy (Signed)
[facility] [address] [place], [place], [address] Phone: [phone]   Fax:  [phone]  Occupational Therapy Treatment  Patient Details  Name: Cheryl Pearson MRN: [id number] Date of Birth: [birth year] No data recorded  Encounter Date: 06/28/2018  OT End of Session - 06/28/18 1442    Visit Number  10    Number of Visits  25    Date for OT Re-Evaluation  08/08/18    Authorization Type  [place], no visit limit    Authorization - Visit Number  10    Authorization - Number of Visits  10    OT Start Time  1409    OT Stop Time  1455    OT Time Calculation (min)  46 min    Activity Tolerance  Patient tolerated treatment well    Behavior During Therapy  [facility] for tasks assessed/performed       Past Medical History:  Diagnosis Date  . Anxiety   . Arthritis   . Colitis   . Early age-related macular degeneration   . Hernia   . Hypertension   . Infertility, female   . Menopausal symptoms   . Rosacea   . Thyroid disease 1968   hypothyroidism treated with meds for short time then all labs normal    Past Surgical History:  Procedure Laterality Date  . bone spur Left   . COLONOSCOPY  11/2011   polyp, ulcerative coliits inactive  . COLONOSCOPY  2005  . COLONOSCOPY  1970's   diagnosed with ulcerative colitis  . ESOPHAGOGASTRODUODENOSCOPY (EGD) WITH PROPOFOL N/A 11/20/2014   Procedure: ESOPHAGOGASTRODUODENOSCOPY (EGD) WITH PROPOFOL;  Surgeon: Laurence Spates, MD;  [facility];  Service: Endoscopy;  Laterality: N/A;  . INGUINAL HERNIA REPAIR Right 49 mos old  . LAPAROSCOPIC CHOLECYSTECTOMY  09/1998  . SAVORY DILATION N/A 11/20/2014   Procedure: SAVORY DILATION;  Surgeon: Laurence Spates, MD;  [facility];  Service: Endoscopy;  Laterality: N/A;  . WISDOM TOOTH EXTRACTION  1968    There were no vitals filed for this visit.  Subjective Assessment - 06/28/18 1511    Subjective   Pt reports  that she feels that things are improving    Pertinent History  cervical throracic myelopathy s/p decompression T1-T2 (surgery 04/22/18); PMH:  anxiety, arthritis, colitis, Macular degeneration    Limitations  back precautions (no bending, lifting, twisting), fall risk    Patient Stated Goals  return to driving, volunteering/helping friends, be able to travel and go out to eat    Currently in Pain?  No/denies          [facility] OT Assessment - 06/28/18 0001      Hand Function   Right Hand Grip (lbs)  51    Left Hand Grip (lbs)  50      Standing at table, modified quadruped, forward/backward wt. Shifts for scapular depression and core strength then reaching across body and in abduction with trunk rotation with each UE, min cues.  In sitting, shoulder flex and chest press with BUEs with ball with mirror/min cueing for core stability and scapular positioning for decr compensation.  Quadruped, alternating UE lifts for incr core/scapular stability with CGA, then rolling ball forward/backward with CGA for incr core stability.   In standing, functional reaching to place small pegs in vertical pegboard overhead with each UE with min difficulty/drops for coordination.  Pt able to retrieve item off floor using energy conservation strategy (sitting).  Simulated making a bed with min difficulty for balance, walker negotiation, particularly when BUEs required.      OT Short Term Goals - 06/28/18 1512      OT SHORT TERM GOAL #1   Title  Pt will be independent with initial HEP.--check STG 06/21/18    Time  6    Period  Weeks    Status  Achieved      OT SHORT TERM GOAL #2   Title  Pt will perform simple cooking task mod I.    Baseline  06/06/18 revised as pt no longer has back precautions.     Time  6    Period  Weeks    Status  On-going    06/20/18: not performing.  06/28/18:  not fully met, simple cold meal prep     OT SHORT TERM GOAL #3   Title  Pt will perform simple home maintenance  task mod I.    Baseline  06/06/18 revised as pt no longer has back precautions.     Time  6    Period  Weeks    Status  On-going   06/20/18: not performing.  06/23/18:  only washing dishes, picking up a little     OT SHORT TERM GOAL #4   Title  Pt will verbalize understanding of energy conservation strategies for ADLs/IADLs.    Time  6    Period  Weeks    Status  On-going   06/23/18:  not fully met     OT SHORT TERM GOAL #5   Title  Pt will report R shoulder pain less than or equal to 4/10 for ADLs.    Time  6    Period  Weeks    Status  Achieved   06/20/18     OT SHORT TERM GOAL #6   Title  Pt will improve R dominant hand grip strength by at least 5lbs to assist with IADLs.    Baseline  48lbs    Time  6    Period  Weeks    Status  On-going   06/28/18:  51lbs       OT Long Term Goals - 05/10/18 1526      OT LONG TERM GOAL #1   Title  Pt will be independent with UE strengthening HEP.--check LTGs 08/08/18    Time  12    Period  Weeks    Status  New      OT LONG TERM GOAL #2   Title  Pt will perform mod complex cooking/cleaning tasks mod I.    Time  12    Period  Weeks    Status  New      OT LONG TERM GOAL #3   Title  Pt will be able to stand to perform IADL/leisure activity for at least 40 min consistently without rest break.    Time  12    Period  Weeks    Status  New      OT LONG TERM GOAL #4   Title  Pt will report R shoulder pain consistently less than or equal to 3/10 for ADLs/IADLs.    Time  12    Period  Weeks    Status  New            Plan - 06/28/18 1512    Clinical Impression Statement  Reporting Period:  05/10/18-06/28/18.  Pt is progressing slowly towards goals.  Pt demo improving standing balance with functional reach  for IADL performance, but continues to demo decr core stability and balance.  Pt also demo improved pain and activity tolerance.  Pt would benefit from continued occupational therapy for standing balance, activity tolerance, and UE  strength for ADLs/IADLs.    Occupational Profile and client history currently impacting functional performance  Pt was independent prior to symptoms beginning mid November.  Pt currently unable to drive or do community/leisure activities independently and is not performing household IADLs.      Occupational performance deficits (Please refer to evaluation for details):  ADL's;IADL's;Leisure;Social Participation    Rehab Potential  Good    OT Frequency  2x / week    OT Duration  12 weeks    OT Treatment/Interventions  Self-care/ADL training;Electrical Stimulation;Therapeutic exercise;Patient/family education;Neuromuscular education;Moist Heat;Aquatic Therapy;Energy conservation;Therapist, nutritional;Therapeutic activities;Balance training;Manual Therapy;DME and/or AE instruction;Passive range of motion;Ultrasound;Cryotherapy;Contrast Bath    Plan  IADLs/simple meal prep, review yellow theraband HEP, core stability    Consulted and Agree with Plan of Care  Patient       Patient will benefit from skilled therapeutic intervention in order to improve the following deficits and impairments:     Visit Diagnosis: Muscle weakness (generalized)  Unsteadiness on feet  Other abnormalities of gait and mobility    Problem List Patient Active Problem List   Diagnosis Date Noted  . Essential hypertension   . Cervical spondylosis without myelopathy   . Steroid-induced hyperglycemia   . Postoperative pain   . Hyponatremia   . Leucocytosis   . Thoracic myelopathy 04/22/2018  . ALLERGIC RHINITIS 01/19/2007    [facility] 06/28/2018, 3:27 PM  [facility] [address] [place], [place], [address] Phone: [phone]   Fax:  [phone]  Name: Cheryl Pearson MRN: [id number] Date of Birth: 31-Jul-1940   Vianne Bulls, OTR/L [facility] [address] [place], [place]  [address] [phone]  phone [phone] 06/28/18 3:27 PM

## 2018-06-29 ENCOUNTER — Encounter: Payer: Medicare Other | Attending: Physical Medicine & Rehabilitation | Admitting: Physical Medicine & Rehabilitation

## 2018-06-29 ENCOUNTER — Other Ambulatory Visit: Payer: Self-pay

## 2018-06-29 ENCOUNTER — Encounter: Payer: Self-pay | Admitting: Physical Medicine & Rehabilitation

## 2018-06-29 VITALS — BP 145/83 | HR 90 | Ht 66.0 in | Wt 160.0 lb

## 2018-06-29 DIAGNOSIS — N319 Neuromuscular dysfunction of bladder, unspecified: Secondary | ICD-10-CM

## 2018-06-29 DIAGNOSIS — E785 Hyperlipidemia, unspecified: Secondary | ICD-10-CM | POA: Insufficient documentation

## 2018-06-29 DIAGNOSIS — Z9889 Other specified postprocedural states: Secondary | ICD-10-CM | POA: Insufficient documentation

## 2018-06-29 DIAGNOSIS — M4714 Other spondylosis with myelopathy, thoracic region: Secondary | ICD-10-CM

## 2018-06-29 DIAGNOSIS — I10 Essential (primary) hypertension: Secondary | ICD-10-CM | POA: Diagnosis present

## 2018-06-29 NOTE — Progress Notes (Signed)
Subjective:    Patient ID: Cheryl Pearson, female    DOB: Jun 09, 1940, 78 y.o.   MRN: [id number]  HPI   Cheryl Pearson is here in follow up of her T1-2 SCI s/p decompression /fusion. She was with [place] on rehab 2 months ago and has progressed to outpt therapies. She is progressing with her balance and mobility. She is wearing a right AFO which is helping with her gait and balance. She is still struggling fatigue and urinary frequency. She is not incontinent of urine but it is sometimes a struggle to get to the bathroom. Her bowels are moving fairly well      Pain Inventory Average Pain 3 Pain Right Now 0 My pain is aching  In the last 24 hours, has pain interfered with the following? General activity 0 Relation with others 0 Enjoyment of life 0 What TIME of day is your pain at its worst? morning Sleep (in general) Fair  Pain is worse with: when first getting up in the morning Pain improves with: pacing activities Relief from Meds: na  Mobility walk with assistance use a walker  Function disabled: date disabled na  Neuro/Psych weakness numbness trouble walking  Prior Studies Any changes since last visit?  no  Physicians involved in your care Any changes since last visit?  no   Family History  Problem Relation Age of Onset  . Cancer Mother        uterine cancer  . Osteoarthritis Mother   . Rheum arthritis Mother   . COPD Mother   . COPD Father   . COPD Brother   . Cancer Maternal Aunt        colon cancer   Social History   Socioeconomic History  . Marital status: Married    Spouse name: Not on file  . Number of children: Not on file  . Years of education: Not on file  . Highest education level: Not on file  Occupational History  . Not on file  Social Needs  . Financial resource strain: Not on file  . Food insecurity:    Worry: Not on file    Inability: Not on file  . Transportation needs:    Medical: Not on file    Non-medical: Not on file  Tobacco  Use  . Smoking status: Never Smoker  . Smokeless tobacco: Never Used  Substance and Sexual Activity  . Alcohol use: Yes    Alcohol/week: 1.0 standard drinks    Types: 1 Standard drinks or equivalent per week    Comment: occasionaly  . Drug use: No  . Sexual activity: Yes    Birth control/protection: Post-menopausal  Lifestyle  . Physical activity:    Days per week: Not on file    Minutes per session: Not on file  . Stress: Not on file  Relationships  . Social connections:    Talks on phone: Not on file    Gets together: Not on file    Attends religious service: Not on file    Active member of club or organization: Not on file    Attends meetings of clubs or organizations: Not on file    Relationship status: Not on file  Other Topics Concern  . Not on file  Social History Narrative  . Not on file   Past Surgical History:  Procedure Laterality Date  . bone spur Left   . COLONOSCOPY  11/2011   polyp, ulcerative coliits inactive  . COLONOSCOPY  2005  .  COLONOSCOPY  1970's   diagnosed with ulcerative colitis  . ESOPHAGOGASTRODUODENOSCOPY (EGD) WITH PROPOFOL N/A 11/20/2014   Procedure: ESOPHAGOGASTRODUODENOSCOPY (EGD) WITH PROPOFOL;  Surgeon: Carman Ching, MD;  [facility];  Service: Endoscopy;  Laterality: N/A;  . INGUINAL HERNIA REPAIR Right 79 mos old  . LAPAROSCOPIC CHOLECYSTECTOMY  09/1998  . SAVORY DILATION N/A 11/20/2014   Procedure: SAVORY DILATION;  Surgeon: Carman Ching, MD;  [facility];  Service: Endoscopy;  Laterality: N/A;  . WISDOM TOOTH EXTRACTION  1968   Past Medical History:  Diagnosis Date  . Anxiety   . Arthritis   . Colitis   . Early age-related macular degeneration   . Hernia   . Hypertension   . Infertility, female   . Menopausal symptoms   . Rosacea   . Thyroid disease 1968   hypothyroidism treated with meds for short time then all labs normal   BP (!) 145/83   Pulse 90   Ht 5\' 6"  (1.676 m)   Wt 160 lb (72.6 kg)    SpO2 95%   BMI 25.82 kg/m   Opioid Risk Score:   Fall Risk Score:  `1  Depression screen PHQ 2/9  Depression screen [facility] 2/9 05/20/2018 04/19/2015  Decreased Interest 0 0  Down, Depressed, Hopeless 0 0  PHQ - 2 Score 0 0   Review of Systems  Constitutional: Negative.   HENT: Negative.   Eyes: Negative.   Respiratory: Negative.   Cardiovascular: Negative.   Gastrointestinal: Negative.   Endocrine: Negative.   Genitourinary: Negative.   Musculoskeletal: Negative.   Skin: Negative.   Allergic/Immunologic: Negative.   Neurological: Positive for weakness and numbness.  Hematological: Negative.   Psychiatric/Behavioral: Negative.   All other systems reviewed and are negative.      Objective:   Physical Exam  General: Alert and oriented x 3, No apparent distress HEENT: Head is normocephalic, atraumatic, PERRLA, EOMI, sclera anicteric, oral mucosa pink and moist, dentition intact, ext ear canals clear,  Neck: Supple without JVD or lymphadenopathy Heart: Reg rate and rhythm. No murmurs rubs or gallops Chest: CTA bilaterally without wheezes, rales, or rhonchi; no distress Abdomen: Soft, non-tender, non-distended, bowel sounds positive. Extremities: No clubbing, cyanosis, or edema. Pulses are 2+ Skin: Clean and intact without signs of breakdown Neuro: Pt is cognitively appropriate with normal insight, memory, and awareness. Cranial nerves 2-12 are intact. l. Reflexes are 2+ in UE's 3+ RLE, 2+ LLE. Fine motor coordination is intact. No tremors. Motor function is grossly 5/5 in UE. RLE: 3/5 HF, KE, HF 3-, ADF/PF 2+/5. LLE: 4/5 prox to distal. Decreased sensation from mid abdomen to right foot, and partially/patch to left foot. Ambulates with walker and good balance. Struggles sometimes to clear the right toes, utilizes kick plate to help with clearance.    Musculoskeletal: Full ROM, No pain with AROM or PROM in the neck, trunk, or extremities. Posture appropriate Psych: Pt's affect is  appropriate. Pt is cooperative         Assessment & Plan:  1. Thoracic Myelpathy: S/P T-1-T-2 Laminectomy Decompression of Spinal Cord Pedicle Screw:    -continue with outpt therapies   -transition to HEP eventually.  Would be a candidate for aquatic-based therapies  -discussed mechanics and gait techniques.   -discussed multiple isues re: recovery, expectations 2. Hypertension: controlled  3. Neurogenic bladder/frequency:  -myrbretriq trial?  -He struggled with this somewhat before, she is compensating for it but sometimes it is very big inconvenience for her.   15  minutes of face to face patient care time was spent during this visit. All questions were encouraged and answered.  F/U with me in 3 months.

## 2018-06-29 NOTE — Patient Instructions (Signed)
PLEASE FEEL FREE TO CALL OUR OFFICE WITH ANY PROBLEMS OR QUESTIONS ([phone])      

## 2018-06-30 ENCOUNTER — Ambulatory Visit: Payer: Medicare Other | Admitting: Physical Therapy

## 2018-06-30 ENCOUNTER — Encounter: Payer: Self-pay | Admitting: Physical Therapy

## 2018-06-30 ENCOUNTER — Ambulatory Visit: Payer: Medicare Other | Admitting: Occupational Therapy

## 2018-06-30 DIAGNOSIS — M6281 Muscle weakness (generalized): Secondary | ICD-10-CM

## 2018-06-30 DIAGNOSIS — R2681 Unsteadiness on feet: Secondary | ICD-10-CM

## 2018-06-30 DIAGNOSIS — R2689 Other abnormalities of gait and mobility: Secondary | ICD-10-CM

## 2018-06-30 NOTE — Therapy (Signed)
[facility] [address] [place], [place], [address] Phone: [phone]   Fax:  [phone]  Occupational Therapy Treatment  Patient Details  Name: Cheryl Pearson MRN: [id number] Date of Birth: 06/22/1940 No data recorded  Encounter Date: 06/30/2018  OT End of Session - 06/30/18 1451    Visit Number  11    Number of Visits  25    Date for OT Re-Evaluation  08/08/18    Authorization Type  [employer] [place], no visit limit    Authorization - Visit Number  11    Authorization - Number of Visits  20    OT Start Time  1400    OT Stop Time  1445    OT Time Calculation (min)  45 min    Activity Tolerance  Patient tolerated treatment well    Behavior During Therapy  [facility] for tasks assessed/performed       Past Medical History:  Diagnosis Date  . Anxiety   . Arthritis   . Colitis   . Early age-related macular degeneration   . Hernia   . Hypertension   . Infertility, female   . Menopausal symptoms   . Rosacea   . Thyroid disease 1968   hypothyroidism treated with meds for short time then all labs normal    Past Surgical History:  Procedure Laterality Date  . bone spur Left   . COLONOSCOPY  11/2011   polyp, ulcerative coliits inactive  . COLONOSCOPY  2005  . COLONOSCOPY  1970's   diagnosed with ulcerative colitis  . ESOPHAGOGASTRODUODENOSCOPY (EGD) WITH PROPOFOL N/A 11/20/2014   Procedure: ESOPHAGOGASTRODUODENOSCOPY (EGD) WITH PROPOFOL;  Surgeon: Laurence Spates, MD;  Location: [facility];  Service: Endoscopy;  Laterality: N/A;  . INGUINAL HERNIA REPAIR Right 5 mos old  . LAPAROSCOPIC CHOLECYSTECTOMY  09/1998  . SAVORY DILATION N/A 11/20/2014   Procedure: SAVORY DILATION;  Surgeon: Laurence Spates, MD;  Location: [facility];  Service: Endoscopy;  Laterality: N/A;  . WISDOM TOOTH EXTRACTION  1968    There were no vitals filed for this visit.  Subjective Assessment - 06/30/18 1404    Subjective   This was a  good workout    Pertinent History  cervical throracic myelopathy s/p decompression T1-T2 (surgery 04/22/18); PMH:  anxiety, arthritis, colitis, Macular degeneration    Limitations   fall risk, cleared of back precautions, can lift what she feels comfortable with    Patient Stated Goals  return to driving, volunteering/helping friends, be able to travel and go out to eat    Currently in Pain?  No/denies       Seated on green air disc for increased core control: worked on A/P pelvic tilts, lateral trunk flexion bilaterally, alternate arm/leg lifts bilaterally. Seated on mat: worked on abdominal strength to come from semi reclined position to upright position Tall kneeling for pelvic control and better proprioception in LE's: worked on transitional movements from short to tall kneeling, tall kneeling w/ trunk rotation, then UE reaching w/ LE wt shifts.  Quadraped: worked on alternating LE lifts w/ focus on core control and shoulder girdle stability Reviewed theraband HEP - pt demo each x 10 reps w/ cues to maintain core control                      OT Short Term Goals - 06/28/18 1512      OT SHORT TERM GOAL #1   Title  Pt will be independent  with initial HEP.--check STG 06/21/18    Time  6    Period  Weeks    Status  Achieved      OT SHORT TERM GOAL #2   Title  Pt will perform simple cooking task mod I.    Baseline  06/06/18 revised as pt no longer has back precautions.     Time  6    Period  Weeks    Status  On-going    06/20/18: not performing.  06/28/18:  not fully met, simple cold meal prep     OT SHORT TERM GOAL #3   Title  Pt will perform simple home maintenance task mod I.    Baseline  06/06/18 revised as pt no longer has back precautions.     Time  6    Period  Weeks    Status  On-going   06/20/18: not performing.  06/23/18:  only washing dishes, picking up a little     OT SHORT TERM GOAL #4   Title  Pt will verbalize understanding of energy conservation  strategies for ADLs/IADLs.    Time  6    Period  Weeks    Status  On-going   06/23/18:  not fully met     OT SHORT TERM GOAL #5   Title  Pt will report R shoulder pain less than or equal to 4/10 for ADLs.    Time  6    Period  Weeks    Status  Achieved   06/20/18     OT SHORT TERM GOAL #6   Title  Pt will improve R dominant hand grip strength by at least 5lbs to assist with IADLs.    Baseline  48lbs    Time  6    Period  Weeks    Status  On-going   06/28/18:  51lbs       OT Long Term Goals - 05/10/18 1526      OT LONG TERM GOAL #1   Title  Pt will be independent with UE strengthening HEP.--check LTGs 08/08/18    Time  12    Period  Weeks    Status  New      OT LONG TERM GOAL #2   Title  Pt will perform mod complex cooking/cleaning tasks mod I.    Time  12    Period  Weeks    Status  New      OT LONG TERM GOAL #3   Title  Pt will be able to stand to perform IADL/leisure activity for at least 40 min consistently without rest break.    Time  12    Period  Weeks    Status  New      OT LONG TERM GOAL #4   Title  Pt will report R shoulder pain consistently less than or equal to 3/10 for ADLs/IADLs.    Time  12    Period  Weeks    Status  New            Plan - 06/30/18 1452    Clinical Impression Statement  Pt gradually progressing with core control and endurance.     Occupational Profile and client history currently impacting functional performance  Pt was independent prior to symptoms beginning mid November.  Pt currently unable to drive or do community/leisure activities independently and is not performing household IADLs.      Occupational performance deficits (Please refer to evaluation for details):  ADL's;IADL's;Leisure;Social Participation  Rehab Potential  Good    OT Frequency  2x / week    OT Duration  12 weeks    OT Treatment/Interventions  Self-care/ADL training;Electrical Stimulation;Therapeutic exercise;Patient/family education;Neuromuscular  education;Moist Heat;Aquatic Therapy;Energy conservation;Therapist, nutritional;Therapeutic activities;Balance training;Manual Therapy;DME and/or AE instruction;Passive range of motion;Ultrasound;Cryotherapy;Contrast Bath    Plan  continue core stability, standing tolerance w/ IADLS    Consulted and Agree with Plan of Care  Patient       Patient will benefit from skilled therapeutic intervention in order to improve the following deficits and impairments:     Visit Diagnosis: Muscle weakness (generalized)  Unsteadiness on feet    Problem List Patient Active Problem List   Diagnosis Date Noted  . Neurogenic bladder 06/29/2018  . Essential hypertension   . Cervical spondylosis without myelopathy   . Steroid-induced hyperglycemia   . Postoperative pain   . Hyponatremia   . Leucocytosis   . Thoracic myelopathy 04/22/2018  . ALLERGIC RHINITIS 01/19/2007    Carey Bullocks, OTR/L 06/30/2018, 2:53 PM  [facility] [address] [place], [place], [address] Phone: [phone]   Fax:  [phone]  Name: Cheryl Pearson MRN: [id number] Date of Birth: 03/02/1941

## 2018-07-01 NOTE — Therapy (Signed)
[facility] [address] [place], [place], [address] Phone: [phone]   Fax:  [phone]  Physical Therapy Treatment  Patient Details  Name: Cheryl Pearson MRN: [id number] Date of Birth: April 27, 1941 Referring Provider (PT): Faith Rogue, MD   Encounter Date: 06/30/2018  PT End of Session - 06/30/18 1320    Visit Number  15    Number of Visits  25    Date for PT Re-Evaluation  07/08/18    Authorization Type  [employer]    PT Start Time  1318    PT Stop Time  1400    PT Time Calculation (min)  42 min    Equipment Utilized During Treatment  Gait belt    Activity Tolerance  Patient tolerated treatment well;No increased pain    Behavior During Therapy  [facility] for tasks assessed/performed       Past Medical History:  Diagnosis Date  . Anxiety   . Arthritis   . Colitis   . Early age-related macular degeneration   . Hernia   . Hypertension   . Infertility, female   . Menopausal symptoms   . Rosacea   . Thyroid disease 1968   hypothyroidism treated with meds for short time then all labs normal    Past Surgical History:  Procedure Laterality Date  . bone spur Left   . COLONOSCOPY  11/2011   polyp, ulcerative coliits inactive  . COLONOSCOPY  2005  . COLONOSCOPY  1970's   diagnosed with ulcerative colitis  . ESOPHAGOGASTRODUODENOSCOPY (EGD) WITH PROPOFOL N/A 11/20/2014   Procedure: ESOPHAGOGASTRODUODENOSCOPY (EGD) WITH PROPOFOL;  Surgeon: Carman Ching, MD;  [facility];  Service: Endoscopy;  Laterality: N/A;  . INGUINAL HERNIA REPAIR Right 64 mos old  . LAPAROSCOPIC CHOLECYSTECTOMY  09/1998  . SAVORY DILATION N/A 11/20/2014   Procedure: SAVORY DILATION;  Surgeon: Carman Ching, MD;  [facility];  Service: Endoscopy;  Laterality: N/A;  . WISDOM TOOTH EXTRACTION  1968    There were no vitals filed for this visit.  Subjective Assessment - 06/30/18 1319    Subjective  No new complaints. No  falls or pain to report at this time.     Patient is accompained by:  Family member    Pertinent History  Thoracic myelopathy, T1-2 laminectomy, arthritis, HTN,     Limitations  Lifting;Standing;Walking;House hold activities    Patient Stated Goals  To return to as much activity as possible and get return to muscles.     Currently in Pain?  No/denies    Pain Score  0-No pain             [facility] Adult PT Treatment/Exercise - 06/30/18 1321      Transfers   Transfers  Sit to Stand;Stand to Sit    Sit to Stand  5: Supervision;With upper extremity assist;With armrests;From chair/3-in-1    Stand to Sit  5: Supervision;With upper extremity assist;With armrests;To chair/3-in-1      Ambulation/Gait   Ambulation/Gait  Yes    Ambulation/Gait Assistance  5: Supervision;4: Min guard;4: Min assist    Ambulation/Gait Assistance Details  use of RW into gym with cues for increased stride and decreased UE support/reliance; use straight cane with single rubber tip per pt request to try this vs the cane with rubber quad tip. min guard to min assist needed for short distances in gym with other activities. cues on cane placement, step length, weight shifting and posture. no buckling noted today.  Ambulation Distance (Feet)  70 Feet   x1 RW; aroiund gym with cane   Assistive device  Rolling walker;Straight cane    Gait Pattern  Step-through pattern;Decreased stride length;Narrow base of support;Trunk flexed    Ambulation Surface  Level;Indoor          Balance Exercises - 06/30/18 1321      Balance Exercises: Standing   Standing Eyes Opened  Narrow base of support (BOS);Head turns;Foam/compliant surface;Other reps (comment);Limitations    Standing Eyes Closed  Wide (BOA);Narrow base of support (BOS);Head turns;Foam/compliant surface;Other reps (comment);30 secs;Limitations    Tandem Stance  Eyes closed;Foam/compliant surface;3 reps;30 secs;Limitations    SLS with Vectors  Solid  surface;Other reps (comment);Limitations    Balance Beam  standing across red beam with HHA: alternating fwd stepping to floor/back onto beam with up to mod assist for balance.     Other Standing Exercises  seated with left foot fwd on foam bubble: sit to stands x 10 reps       Balance Exercises: Standing   Standing Eyes Opened Limitations  on airex in corner with chair in front for safety: feet together EO for head movements left<>right, then up<>down, min guard to min assist for balance, cues on posture/weight shifitng for balance.                                Standing Eyes Closed Limitations  on airex in corner with chair in front for safety: wide base of support for EC no head movements, progressing to EC head movements left<>right, then up<>down. progressed to feet together for EC no head movements, min guard to min assist for balance. cues on posture and weight shifting to assist with balance recovery.     SLS with Vectors Limitations  with use of yoga blocks on side for taller target: alternating toe taps to each with emphasis in incr hip/knee flexion and light taps for control, 10 reps each side with up to min assist for balance. cues for incr stance position and to weight shift, engage quad for stance stability and on posture to assist with balance.                           Tandem Gait Limitations  modified tandem stance on airex in corner with chair in front for safety: performed 3 reps each foot forward with EC with no UE support, cues on posture.                            PT Short Term Goals - 06/09/18 3086      PT SHORT TERM GOAL #1   Title  Patient and husband demonstrate understanding of updated HEP. (All updated STGs Target Date: 07/08/2018)    Time  1    Period  Months    Status  Revised    Target Date  07/08/18      PT SHORT TERM GOAL #2   Title  Patient ambulates 100' with cane & AFO with minA.     Time  1    Period  Months    Status  New    Target Date  07/08/18       PT SHORT TERM GOAL #3   Title  Patient negotiates ramps & curbs with cane & AFO with modA.     Time  1    Period  Months    Status  Revised    Target Date  07/08/18      PT SHORT TERM GOAL #4   Title  Berg Balance >/= 30/56    Time  1    Period  Months    Status  New    Target Date  07/08/18        PT Long Term Goals - 05/11/18 1213      PT LONG TERM GOAL #1   Title  Patient verbalizes understanding of fall prevention strategies including assistive devices and any percautions that still exist. (All LTGs Target Dates: 08/05/2018)    Time  3    Period  Months    Status  New    Target Date  08/05/18      PT LONG TERM GOAL #2   Title  Patient verbalizes & demonstrates understanding of ongoing HEP / fitness plan including returning gym.     Time  3    Period  Months    Target Date  08/05/18      PT LONG TERM GOAL #3   Title  Berg Balance >36/56 to indicate lower fall risk.     Time  3    Period  Months    Target Date  08/05/18      PT LONG TERM GOAL #4   Title  Patient ambulates >300' with LRAD modified independent for community mobility.     Time  3    Period  Months    Status  New    Target Date  08/05/18      PT LONG TERM GOAL #5   Title  Patient negotiates ramps, curbs & stairs with LRAD modified independent to enable community access.     Time  3    Period  Months    Status  New    Target Date  08/05/18            Plan - 06/30/18 1320    Clinical Impression Statement  Today's skilled session continued to focus on balance reactions and gait with cane. No issues reported in session. The pt is progressing well toward goals and should benefit from continued PT to progress toward unmet goals.     Rehab Potential  Good    PT Frequency  2x / week    PT Duration  12 weeks    PT Treatment/Interventions  ADLs/Self Care Home Management;Canalith Repostioning;DME Instruction;Gait training;Stair training;Functional mobility training;Therapeutic  activities;Therapeutic exercise;Balance training;Neuromuscular re-education;Patient/family education;Orthotic Fit/Training;Vestibular    PT Next Visit Plan  STGs due week of 07/08/18. continue with  gait training with single tip cane, continue with bil LE strengthening- step ups, step downs, lunges, leg press;  continue with balance reactions/stepping strategies on compliant surfaces    PT Home Exercise Plan  [id number]    Consulted and Agree with Plan of Care  Patient;Family member/caregiver    Family Member Consulted  husband, Natsha Guidry       Patient will benefit from skilled therapeutic intervention in order to improve the following deficits and impairments:  Abnormal gait, Decreased activity tolerance, Decreased balance, Decreased endurance, Decreased knowledge of use of DME, Decreased knowledge of precautions, Decreased mobility, Decreased strength, Dizziness, Postural dysfunction, Pain  Visit Diagnosis: Muscle weakness (generalized)  Unsteadiness on feet  Other abnormalities of gait and mobility     Problem List Patient Active Problem List   Diagnosis Date Noted  . Neurogenic bladder 06/29/2018  .  Essential hypertension   . Cervical spondylosis without myelopathy   . Steroid-induced hyperglycemia   . Postoperative pain   . Hyponatremia   . Leucocytosis   . Thoracic myelopathy 04/22/2018  . ALLERGIC RHINITIS 01/19/2007    Sallyanne Kuster, PTA, [facility] [address], [address] [place], [place] [address] [phone] 07/01/18, 9:57 AM   Name: Cheryl Pearson MRN: [id number] Date of Birth: [birth year]

## 2018-07-05 ENCOUNTER — Encounter: Payer: Self-pay | Admitting: Physical Therapy

## 2018-07-05 ENCOUNTER — Ambulatory Visit: Payer: Medicare Other | Admitting: Physical Therapy

## 2018-07-05 ENCOUNTER — Ambulatory Visit: Payer: Medicare Other | Admitting: Occupational Therapy

## 2018-07-05 ENCOUNTER — Encounter: Payer: Self-pay | Admitting: Occupational Therapy

## 2018-07-05 DIAGNOSIS — R2681 Unsteadiness on feet: Secondary | ICD-10-CM

## 2018-07-05 DIAGNOSIS — M6281 Muscle weakness (generalized): Secondary | ICD-10-CM

## 2018-07-05 DIAGNOSIS — M25511 Pain in right shoulder: Secondary | ICD-10-CM

## 2018-07-05 DIAGNOSIS — R2689 Other abnormalities of gait and mobility: Secondary | ICD-10-CM

## 2018-07-05 NOTE — Therapy (Addendum)
[facility] [address] [place], [place], [address] Phone: [phone]   Fax:  [phone]  Occupational Therapy Treatment  Patient Details  Name: Cheryl Pearson MRN: [id number] Date of Birth: 11-Feb-1941 No data recorded  Encounter Date: 07/05/2018  OT End of Session - 07/05/18 1410    Visit Number  12    Number of Visits  25    Date for OT Re-Evaluation  08/08/18    Authorization Type  [place] [employer] [place], no visit limit    Authorization - Visit Number  12    Authorization - Number of Visits  20    OT Start Time  1408    OT Stop Time  1448    OT Time Calculation (min)  40 min    Activity Tolerance  Patient tolerated treatment well    Behavior During Therapy  [facility] for tasks assessed/performed       Past Medical History:  Diagnosis Date  . Anxiety   . Arthritis   . Colitis   . Early age-related macular degeneration   . Hernia   . Hypertension   . Infertility, female   . Menopausal symptoms   . Rosacea   . Thyroid disease 1968   hypothyroidism treated with meds for short time then all labs normal    Past Surgical History:  Procedure Laterality Date  . bone spur Left   . COLONOSCOPY  11/2011   polyp, ulcerative coliits inactive  . COLONOSCOPY  2005  . COLONOSCOPY  1970's   diagnosed with ulcerative colitis  . ESOPHAGOGASTRODUODENOSCOPY (EGD) WITH PROPOFOL N/A 11/20/2014   Procedure: ESOPHAGOGASTRODUODENOSCOPY (EGD) WITH PROPOFOL;  Surgeon: Laurence Spates, MD;  [facility];  Service: Endoscopy;  Laterality: N/A;  . INGUINAL HERNIA REPAIR Right 13 mos old  . LAPAROSCOPIC CHOLECYSTECTOMY  09/1998  . SAVORY DILATION N/A 11/20/2014   Procedure: SAVORY DILATION;  Surgeon: Laurence Spates, MD;  [facility];  Service: Endoscopy;  Laterality: N/A;  . WISDOM TOOTH EXTRACTION  1968    There were no vitals filed for this visit.  Subjective Assessment - 07/05/18 1409    Subjective   doing better     Pertinent History  cervical throracic myelopathy s/p decompression T1-T2 (surgery 04/22/18); PMH:  anxiety, arthritis, colitis, Macular degeneration    Limitations   fall risk, cleared of back precautions, can lift what she feels comfortable with    Patient Stated Goals  return to driving, volunteering/helping friends, be able to travel and go out to eat    Currently in Pain?  No/denies        Discussed appropriate community fitness activities:  Low-mid range UE weight machines (rows, chest press, biceps, triceps, shoulder depression, horizontal abduction) with low weights (avoid overhead for now), UBE, and silver sneakers or ahoy classes with no UE weights initially (then if no problems, may add 1lb and where she can hold onto chair during class.  Pt/husband verbalized understanding.  In sitting, shoulder flex to 90* and chest press BUEs x10 each with 3lb weighted ball with min v.c. for positioning.  Quadruped, forward/back wt shifts and alternating UE lifts for incr core/shoulder stability and strength with min v.c.  In tall kneeling, shoulder flex closed chain with BUEs with ball then pushing ball forward/back on mat for incr core stability and UE ROM.  Standing in kitchen and performing simulated meal prep/home maintenance to place items in dishwasher, retrieve pots/pans from overhead and bottom shelves, and filling up  pot with water and taking to/from stove.  Pt with min v.c. for safety/balance with negotiation while carrying items in BUEs.  Pt returned demo after initial cueing.  Reviewed basic ways to conserve energy prn.  In standing, AAROM shoulder flex with BUEs with ball on wall for incr trunk control and end-range ROM with min cueing for compensation initially.  Arm bike x27mn level 1 for reciprocal movement/conditioning without rest/pain.      OT Short Term Goals - 07/05/18 1514      OT SHORT TERM GOAL #1   Title  Pt will be independent with initial HEP.--check STG  06/21/18    Time  6    Period  Weeks    Status  Achieved      OT SHORT TERM GOAL #2   Title  Pt will perform simple cooking task mod I.    Baseline  06/06/18 revised as pt no longer has back precautions.     Time  6    Period  Weeks    Status  On-going    06/20/18: not performing.  06/28/18:  not fully met, simple cold meal prep.       OT SHORT TERM GOAL #3   Title  Pt will perform simple home maintenance task mod I.    Baseline  06/06/18 revised as pt no longer has back precautions.     Time  6    Period  Weeks    Status  On-going   06/20/18: not performing.  06/23/18:  only washing dishes, picking up a little     OT SHORT TERM GOAL #4   Title  Pt will verbalize understanding of energy conservation strategies for ADLs/IADLs.    Time  6    Period  Weeks    Status  On-going   06/23/18:  not fully met     OT SHORT TERM GOAL #5   Title  Pt will report R shoulder pain less than or equal to 4/10 for ADLs.    Time  6    Period  Weeks    Status  Achieved   06/20/18     OT SHORT TERM GOAL #6   Title  Pt will improve R dominant hand grip strength by at least 5lbs to assist with IADLs.    Baseline  48lbs    Time  6    Period  Weeks    Status  On-going   06/28/18:  51lbs       OT Long Term Goals - 05/10/18 1526      OT LONG TERM GOAL #1   Title  Pt will be independent with UE strengthening HEP.--check LTGs 08/08/18    Time  12    Period  Weeks    Status  New      OT LONG TERM GOAL #2   Title  Pt will perform mod complex cooking/cleaning tasks mod I.    Time  12    Period  Weeks    Status  New      OT LONG TERM GOAL #3   Title  Pt will be able to stand to perform IADL/leisure activity for at least 40 min consistently without rest break.    Time  12    Period  Weeks    Status  New      OT LONG TERM GOAL #4   Title  Pt will report R shoulder pain consistently less than or equal to 3/10 for ADLs/IADLs.  Time  12    Period  Weeks    Status  New            Plan -  07/05/18 1513    Clinical Impression Statement  Pt progressing well with UE strength/endurance, improved core stability and balance.    Occupational Profile and client history currently impacting functional performance  Pt was independent prior to symptoms beginning mid November.  Pt currently unable to drive or do community/leisure activities independently and is not performing household IADLs.      Occupational performance deficits (Please refer to evaluation for details):  ADL's;IADL's;Leisure;Social Participation    Rehab Potential  Good    OT Frequency  2x / week    OT Duration  12 weeks    OT Treatment/Interventions  Self-care/ADL training;Electrical Stimulation;Therapeutic exercise;Patient/family education;Neuromuscular education;Moist Heat;Aquatic Therapy;Energy conservation;Therapist, nutritional;Therapeutic activities;Balance training;Manual Therapy;DME and/or AE instruction;Passive range of motion;Ultrasound;Cryotherapy;Contrast Bath    Plan  simulated IADLs, adaptive strategies/energy conservation strategies prn    Consulted and Agree with Plan of Care  Patient       Patient will benefit from skilled therapeutic intervention in order to improve the following deficits and impairments:     Visit Diagnosis: Muscle weakness (generalized)  Unsteadiness on feet  Other abnormalities of gait and mobility  Acute pain of right shoulder    Problem List Patient Active Problem List   Diagnosis Date Noted  . Neurogenic bladder 06/29/2018  . Essential hypertension   . Cervical spondylosis without myelopathy   . Steroid-induced hyperglycemia   . Postoperative pain   . Hyponatremia   . Leucocytosis   . Thoracic myelopathy 04/22/2018  . ALLERGIC RHINITIS 01/19/2007    [facility] 07/06/2018, 12:33 AM  [facility] [address] [place], [place], [address] Phone: [phone]   Fax:  [phone]  Name: Cheryl Pearson MRN: [id number] Date of Birth: [birth year]   Vianne Bulls, OTR/L [facility] [address] [place], [place]  [address] [phone] phone [phone] 07/06/18 12:33 AM

## 2018-07-06 NOTE — Therapy (Signed)
[facility] [address] [place], [place], [address] Phone: [phone]   Fax:  [phone]  Physical Therapy Treatment  Patient Details  Name: Cheryl Pearson MRN: [id number] Date of Birth: 1940/06/27 Referring Provider (PT): Alger Simons, MD   Encounter Date: 07/05/2018     07/05/18 1236  PT Visits / Re-Eval  Visit Number 16  Number of Visits 25  Date for PT Re-Evaluation 07/08/18  Authorization  Authorization Type [employer]  PT Time Calculation  PT Start Time 1234  PT Stop Time 1315  PT Time Calculation (min) 41 min  PT - End of Session  Equipment Utilized During Treatment Gait belt  Activity Tolerance Patient tolerated treatment well;No increased pain  Behavior During Therapy WFL for tasks assessed/performed    Past Medical History:  Diagnosis Date  . Anxiety   . Arthritis   . Colitis   . Early age-related macular degeneration   . Hernia   . Hypertension   . Infertility, female   . Menopausal symptoms   . Rosacea   . Thyroid disease 1968   hypothyroidism treated with meds for short time then all labs normal    Past Surgical History:  Procedure Laterality Date  . bone spur Left   . COLONOSCOPY  11/2011   polyp, ulcerative coliits inactive  . COLONOSCOPY  2005  . COLONOSCOPY  1970's   diagnosed with ulcerative colitis  . ESOPHAGOGASTRODUODENOSCOPY (EGD) WITH PROPOFOL N/A 11/20/2014   Procedure: ESOPHAGOGASTRODUODENOSCOPY (EGD) WITH PROPOFOL;  Surgeon: Laurence Spates, MD;  Location: [facility];  Service: Endoscopy;  Laterality: N/A;  . INGUINAL HERNIA REPAIR Right 30 mos old  . LAPAROSCOPIC CHOLECYSTECTOMY  09/1998  . SAVORY DILATION N/A 11/20/2014   Procedure: SAVORY DILATION;  Surgeon: Laurence Spates, MD;  Location: [facility];  Service: Endoscopy;  Laterality: N/A;  . WISDOM TOOTH EXTRACTION  1968    There were no vitals filed for this visit.      07/05/18 1236  Symptoms/Limitations   Subjective No new complaints. No falls or pain to report at this time.   Patient is accompained by: Family member  Pertinent History Thoracic myelopathy, T1-2 laminectomy, arthritis, HTN,   Limitations Lifting;Standing;Walking;House hold activities  Patient Stated Goals To return to as much activity as possible and get return to muscles.   Pain Assessment  Currently in Pain? No/denies  Pain Score 0       07/05/18 1236  Berg Balance Test  Sit to Stand 4  Standing Unsupported 4  Sitting with Back Unsupported but Feet Supported on Floor or Stool 4  Stand to Sit 4  Transfers 3  Standing Unsupported with Eyes Closed 3  Standing Unsupported with Feet Together 4  From Standing, Reach Forward with Outstretched Arm 4  From Standing Position, Pick up Object from Floor 4  From Standing Position, Turn to Look Behind Over each Shoulder 4  Turn 360 Degrees 2  Standing Unsupported, Alternately Place Feet on Step/Stool 1  Standing Unsupported, One Foot in Front 3  Standing on One Leg 3  Total Score 47  Berg comment: 47/56      07/05/18 1300  Transfers  Transfers Sit to Stand;Stand to Sit  Sit to Stand 5: Supervision;With upper extremity assist;With armrests;From chair/3-in-1  Stand to Sit 5: Supervision;With upper extremity assist;With armrests;To chair/3-in-1  Ambulation/Gait  Ambulation/Gait Yes  Ambulation/Gait Assistance 5: Supervision;4: Min guard;4: Min assist  Ambulation/Gait Assistance Details cues on posture, sequencing, to incr base of support and  at times for cane placement so not to step/trip over it. Supervision with RW. Min guard to min assist for use of cane.   Ambulation Distance (Feet) 115 Feet (x1 w/cane, plus around gym)  Assistive device Rolling walker;Straight cane (cane with rubber quad tip)  Gait Pattern Step-through pattern;Decreased stride length;Narrow base of support;Trunk flexed  Ambulation Surface Level;Indoor  Ramp 3: Mod assist  Ramp Details (indicate  cue type and reason) with cane/AFO- cues on posture, step length and sequencing   Curb 3: Mod assist  Curb Details (indicate cue type and reason) with cane/right AFO- cues on sequenicng and technique.  incr assist with ascending vs descending indoor 6 inch curb.  Self-Care  Self-Care Other Self-Care Comments  Other Self-Care Comments  discussed return to gym with Silver Sneakers. pt was a member of the [employer] and her spouse is a member of [employer]. both offer silver sneakers. the pt is to call and get re-estabished. Discussed safe cardio and LE weight equipment for her to use. Defered UE to OT (see's them after PT todaY0.         PT Short Term Goals - 07/05/18 1301      PT SHORT TERM GOAL #1   Title  Patient and husband demonstrate understanding of updated HEP. (All updated STGs Target Date: 07/08/2018)    Time  1    Period  Months    Status  Revised    Target Date  07/08/18      PT SHORT TERM GOAL #2   Title  Patient ambulates 100' with cane & AFO with minA.     Time  1    Period  Months    Status  New    Target Date  07/08/18      PT SHORT TERM GOAL #3   Title  Patient negotiates ramps & curbs with cane & AFO with modA.     Time  1    Period  Months    Status  Revised    Target Date  07/08/18      PT SHORT TERM GOAL #4   Title  Berg Balance >/= 30/56    Baseline  07/05/2018: 47/56 scored today    Status  Achieved    Target Date  07/08/18        PT Long Term Goals - 05/11/18 1213      PT LONG TERM GOAL #1   Title  Patient verbalizes understanding of fall prevention strategies including assistive devices and any percautions that still exist. (All LTGs Target Dates: 08/05/2018)    Time  3    Period  Months    Status  New    Target Date  08/05/18      PT LONG TERM GOAL #2   Title  Patient verbalizes & demonstrates understanding of ongoing HEP / fitness plan including returning gym.     Time  3    Period  Months    Target Date  08/05/18      PT LONG TERM  GOAL #3   Title  Berg Balance >36/56 to indicate lower fall risk.     Time  3    Period  Months    Target Date  08/05/18      PT LONG TERM GOAL #4   Title  Patient ambulates >300' with LRAD modified independent for community mobility.     Time  3    Period  Months    Status  New    Target Date  08/05/18      PT LONG TERM GOAL #5   Title  Patient negotiates ramps, curbs & stairs with LRAD modified independent to enable community access.     Time  3    Period  Months    Status  New    Target Date  08/05/18         07/05/18 1236  Plan  Clinical Impression Statement Today's skilled session focused on progress toward STGs with 4/4 goals met. The pt is progressing well and should benefit from continued PT toward unmet goals.   Pt will benefit from skilled therapeutic intervention in order to improve on the following deficits Abnormal gait;Decreased activity tolerance;Decreased balance;Decreased endurance;Decreased knowledge of use of DME;Decreased knowledge of precautions;Decreased mobility;Decreased strength;Dizziness;Postural dysfunction;Pain  Rehab Potential Good  PT Frequency 2x / week  PT Duration 12 weeks  PT Treatment/Interventions ADLs/Self Care Home Management;Canalith Repostioning;DME Instruction;Gait training;Stair training;Functional mobility training;Therapeutic activities;Therapeutic exercise;Balance training;Neuromuscular re-education;Patient/family education;Orthotic Fit/Training;Vestibular  PT Next Visit Plan continue with  gait training with single tip cane, continue with bil LE strengthening- step ups, step downs, lunges, leg press;  continue with balance reactions/stepping strategies on compliant surfaces  PT Home Exercise Plan [id number]  Consulted and Agree with Plan of Care Patient;Family member/caregiver  Family Member Consulted husband, Cheryl Pearson          Patient will benefit from skilled therapeutic intervention in order to improve the following deficits  and impairments:  Abnormal gait, Decreased activity tolerance, Decreased balance, Decreased endurance, Decreased knowledge of use of DME, Decreased knowledge of precautions, Decreased mobility, Decreased strength, Dizziness, Postural dysfunction, Pain  Visit Diagnosis: Muscle weakness (generalized)  Unsteadiness on feet  Other abnormalities of gait and mobility     Problem List Patient Active Problem List   Diagnosis Date Noted  . Neurogenic bladder 06/29/2018  . Essential hypertension   . Cervical spondylosis without myelopathy   . Steroid-induced hyperglycemia   . Postoperative pain   . Hyponatremia   . Leucocytosis   . Thoracic myelopathy 04/22/2018  . ALLERGIC RHINITIS 01/19/2007    Willow Ora, PTA, [facility] [address], [place], [place] [address] [phone] 07/06/18, 10:54 PM   Name: Cheryl Pearson MRN: [id number] Date of Birth: 1940/05/09

## 2018-07-07 ENCOUNTER — Encounter: Payer: Self-pay | Admitting: Occupational Therapy

## 2018-07-07 ENCOUNTER — Other Ambulatory Visit: Payer: Self-pay

## 2018-07-07 ENCOUNTER — Ambulatory Visit: Payer: Medicare Other | Admitting: Physical Therapy

## 2018-07-07 ENCOUNTER — Ambulatory Visit: Payer: Medicare Other | Admitting: Occupational Therapy

## 2018-07-07 ENCOUNTER — Encounter: Payer: Self-pay | Admitting: Physical Therapy

## 2018-07-07 DIAGNOSIS — M6281 Muscle weakness (generalized): Secondary | ICD-10-CM

## 2018-07-07 DIAGNOSIS — R2689 Other abnormalities of gait and mobility: Secondary | ICD-10-CM

## 2018-07-07 DIAGNOSIS — R2681 Unsteadiness on feet: Secondary | ICD-10-CM

## 2018-07-07 DIAGNOSIS — M25511 Pain in right shoulder: Secondary | ICD-10-CM

## 2018-07-07 NOTE — Therapy (Signed)
[facility] [address] [place], [place], [address] Phone: [phone]   Fax:  [phone]  Occupational Therapy Treatment  Patient Details  Name: Cheryl Pearson MRN: [id number] Date of Birth: 12-29-1940 No data recorded  Encounter Date: 07/07/2018  OT End of Session - 07/07/18 1545    Visit Number  13    Number of Visits  25    Date for OT Re-Evaluation  08/08/18    Authorization Type  [place] [employer] [place], no visit limit    Authorization - Visit Number  13    Authorization - Number of Visits  20    OT Start Time  1405   pt arrived for PT session and OT session but OT tx status did not change to "arrived" until mid-session    OT Stop Time  1446    OT Time Calculation (min)  41 min    Activity Tolerance  Patient tolerated treatment well    Behavior During Therapy  [facility] for tasks assessed/performed       Past Medical History:  Diagnosis Date  . Anxiety   . Arthritis   . Colitis   . Early age-related macular degeneration   . Hernia   . Hypertension   . Infertility, female   . Menopausal symptoms   . Rosacea   . Thyroid disease 1968   hypothyroidism treated with meds for short time then all labs normal    Past Surgical History:  Procedure Laterality Date  . bone spur Left   . COLONOSCOPY  11/2011   polyp, ulcerative coliits inactive  . COLONOSCOPY  2005  . COLONOSCOPY  1970's   diagnosed with ulcerative colitis  . ESOPHAGOGASTRODUODENOSCOPY (EGD) WITH PROPOFOL N/A 11/20/2014   Procedure: ESOPHAGOGASTRODUODENOSCOPY (EGD) WITH PROPOFOL;  Surgeon: Laurence Spates, MD;  Location: [facility];  Service: Endoscopy;  Laterality: N/A;  . INGUINAL HERNIA REPAIR Right 50 mos old  . LAPAROSCOPIC CHOLECYSTECTOMY  09/1998  . SAVORY DILATION N/A 11/20/2014   Procedure: SAVORY DILATION;  Surgeon: Laurence Spates, MD;  Location: [facility];  Service: Endoscopy;  Laterality: N/A;  . WISDOM TOOTH EXTRACTION  1968    There  were no vitals filed for this visit.  Subjective Assessment - 07/07/18 1544    Subjective   Pt reports she enjoyed her last OT session, reports it gave her a good workout.     Patient is accompanied by:  Family member   husband    Pertinent History  cervical throracic myelopathy s/p decompression T1-T2 (surgery 04/22/18); PMH:  anxiety, arthritis, colitis, Macular degeneration    Limitations   fall risk, cleared of back precautions, can lift what she feels comfortable with    Patient Stated Goals  return to driving, volunteering/helping friends, be able to travel and go out to eat    Currently in Pain?  No/denies          Pt engaged in simulated iADL task, standing while transporting laundry to washer, dryer, folding laundry and placing in laundry basket. Pt completing task with overall close minguard assist, intermittent minA when reaching into lower surface level of dryer or when performing bil UE tasks (folding) without UE support.   Seated on mat table pt engaged in chest press and shoulder flexion x10 reps each while holding 3lb ball with bil UEs. Transitioned to quadruped on mat, both UEs placed on physio-ball engaged in rolling ball forwards and back x10 reps, min cues and minA provided to maintain trunk  stability and to engage core musculature. In quadruped pt completing alternating UE lifts x5 each side, completing with min/mod difficulty and minA for increased stability. Transitioned back to sitting pt engaged in seated truncal rotation with UEs extended to 90* while holding 1lb ball, min cues for posture with good carryover noted.   Trialed completion of shoulder flexion beyond 90* (able to achieve full overhead with pt reporting no increase in pain) holding light weight dowel rod with bil UEs, min cues for posture with pt able to maintain well and without shoulder compensatory movements. Positioned pt in sitting on cushioned balance disc to further challenge pt balance and truncal  stability, completed x10 additional reps shoulder flexion with dowel rod. Seated on balance disc holding 1lb ball with bil UEs engaged in diagonal overhead reach utilizing PNF patterns towards L and R side, min cues for posture, x10 reps each side.                     OT Short Term Goals - 07/05/18 1514      OT SHORT TERM GOAL #1   Title  Pt will be independent with initial HEP.--check STG 06/21/18    Time  6    Period  Weeks    Status  Achieved      OT SHORT TERM GOAL #2   Title  Pt will perform simple cooking task mod I.    Baseline  06/06/18 revised as pt no longer has back precautions.     Time  6    Period  Weeks    Status  On-going    06/20/18: not performing.  06/28/18:  not fully met, simple cold meal prep.       OT SHORT TERM GOAL #3   Title  Pt will perform simple home maintenance task mod I.    Baseline  06/06/18 revised as pt no longer has back precautions.     Time  6    Period  Weeks    Status  On-going   06/20/18: not performing.  06/23/18:  only washing dishes, picking up a little     OT SHORT TERM GOAL #4   Title  Pt will verbalize understanding of energy conservation strategies for ADLs/IADLs.    Time  6    Period  Weeks    Status  On-going   06/23/18:  not fully met     OT SHORT TERM GOAL #5   Title  Pt will report R shoulder pain less than or equal to 4/10 for ADLs.    Time  6    Period  Weeks    Status  Achieved   06/20/18     OT SHORT TERM GOAL #6   Title  Pt will improve R dominant hand grip strength by at least 5lbs to assist with IADLs.    Baseline  48lbs    Time  6    Period  Weeks    Status  On-going   06/28/18:  51lbs       OT Long Term Goals - 05/10/18 1526      OT LONG TERM GOAL #1   Title  Pt will be independent with UE strengthening HEP.--check LTGs 08/08/18    Time  12    Period  Weeks    Status  New      OT LONG TERM GOAL #2   Title  Pt will perform mod complex cooking/cleaning tasks mod I.    Time  12    Period   Weeks    Status  New      OT LONG TERM GOAL #3   Title  Pt will be able to stand to perform IADL/leisure activity for at least 40 min consistently without rest break.    Time  12    Period  Weeks    Status  New      OT LONG TERM GOAL #4   Title  Pt will report R shoulder pain consistently less than or equal to 3/10 for ADLs/IADLs.    Time  12    Period  Weeks    Status  New            Plan - 07/07/18 1546    Clinical Impression Statement  Pt making good progress towards goals, improvements noted with core strength, standing balance, endurance, and overhead reach     Occupational Profile and client history currently impacting functional performance  Pt was independent prior to symptoms beginning mid November.  Pt currently unable to drive or do community/leisure activities independently and is not performing household IADLs.      Occupational performance deficits (Please refer to evaluation for details):  ADL's;IADL's;Leisure;Social Participation    Body Structure / Function / Physical Skills  ADL;ROM;Strength;Balance;Coordination;Body mechanics;Mobility;UE functional use;Gait;Endurance;Flexibility;IADL    OT Frequency  2x / week    OT Duration  12 weeks    OT Treatment/Interventions  Self-care/ADL training;Electrical Stimulation;Therapeutic exercise;Patient/family education;Neuromuscular education;Moist Heat;Aquatic Therapy;Energy conservation;Therapist, nutritional;Therapeutic activities;Balance training;Manual Therapy;DME and/or AE instruction;Passive range of motion;Ultrasound;Cryotherapy;Contrast Bath    Plan  continue to addres iADLs PRN, endurance, core stability, grip strength for increased ease with functional task completion    Consulted and Agree with Plan of Care  Patient       Patient will benefit from skilled therapeutic intervention in order to improve the following deficits and impairments:  Body Structure / Function / Physical Skills  Visit  Diagnosis: Muscle weakness (generalized)  Other abnormalities of gait and mobility  Acute pain of right shoulder  Unsteadiness on feet    Problem List Patient Active Problem List   Diagnosis Date Noted  . Neurogenic bladder 06/29/2018  . Essential hypertension   . Cervical spondylosis without myelopathy   . Steroid-induced hyperglycemia   . Postoperative pain   . Hyponatremia   . Leucocytosis   . Thoracic myelopathy 04/22/2018  . ALLERGIC RHINITIS 01/19/2007    Raymondo Band, OTR/L 07/07/2018, 4:41 PM  [facility] [address] [place], [place], [address] Phone: [phone]   Fax:  [phone]  Name: CILICIA BORDEN MRN: [id number] Date of Birth: 1940/12/18

## 2018-07-08 NOTE — Therapy (Signed)
[facility] [address] [place], [place], [address] Phone: [phone]   Fax:  [phone]  Physical Therapy Treatment  Patient Details  Name: Cheryl Pearson MRN: [id number]  Date of Birth: April 30, 1940 Referring Provider (PT): Alger Simons, MD   Encounter Date: 07/07/2018  PT End of Session - 07/07/18 1325    Visit Number  17    Number of Visits  25    Date for PT Re-Evaluation  07/08/18    Authorization Type  [employer] Medicare    PT Start Time  1319    PT Stop Time  1400    PT Time Calculation (min)  41 min    Equipment Utilized During Treatment  Gait belt    Activity Tolerance  Patient tolerated treatment well;No increased pain    Behavior During Therapy  WFL for tasks assessed/performed       Past Medical History:  Diagnosis Date  . Anxiety   . Arthritis   . Colitis   . Early age-related macular degeneration   . Hernia   . Hypertension   . Infertility, female   . Menopausal symptoms   . Rosacea   . Thyroid disease 1968   hypothyroidism treated with meds for short time then all labs normal    Past Surgical History:  Procedure Laterality Date  . bone spur Left   . COLONOSCOPY  11/2011   polyp, ulcerative coliits inactive  . COLONOSCOPY  2005  . COLONOSCOPY  1970's   diagnosed with ulcerative colitis  . ESOPHAGOGASTRODUODENOSCOPY (EGD) WITH PROPOFOL N/A 11/20/2014   Procedure: ESOPHAGOGASTRODUODENOSCOPY (EGD) WITH PROPOFOL;  Surgeon: Laurence Spates, MD;  Location: [facility];  Service: Endoscopy;  Laterality: N/A;  . INGUINAL HERNIA REPAIR Right 66 mos old  . LAPAROSCOPIC CHOLECYSTECTOMY  09/1998  . SAVORY DILATION N/A 11/20/2014   Procedure: SAVORY DILATION;  Surgeon: Laurence Spates, MD;  Location: [facility];  Service: Endoscopy;  Laterality: N/A;  . WISDOM TOOTH EXTRACTION  1968    There were no vitals filed for this visit.  Subjective Assessment - 07/07/18 1324    Subjective  No new complaitns. No  falls or pain to report. Was sore in her thigh and across the lower back after working with the ball last session with OT.     Patient is accompained by:  Family member    Pertinent History  Thoracic myelopathy, T1-2 laminectomy, arthritis, HTN,     Patient Stated Goals  To return to as much activity as possible and get return to muscles.     Currently in Pain?  No/denies    Pain Score  0-No pain             [facility] Adult PT Treatment/Exercise - 07/07/18 1327      Transfers   Transfers  Sit to Stand;Stand to [employer];Floor to Transfer    Sit to Stand  5: Supervision;With upper extremity assist;With armrests;From chair/3-in-1    Stand to Sit  5: Supervision;With upper extremity assist;With armrests;To chair/3-in-1    Floor to Transfer  4: Min guard;4: Min assist    Floor to Transfer Details (indicate cue type and reason)  PTA demo'd technique prior to pt performance. with UE support on blue mat table pt lowered down into half kneeling on red mat, then came to tall kneeling. worked on Building control surveyor (see neuro re-ed section). from tall kneeling had pt bring her left foot forward for half kneeling, then pt pushed self up  with arms/left LE while bringing the other leg under her for standing. min guard assist to turn and sit on mat table.       Ambulation/Gait   Ambulation/Gait  Yes    Ambulation/Gait Assistance  5: Supervision    Ambulation/Gait Assistance Details  cues for equal step length, equal weight bearing/stance time with gait.     Ambulation Distance (Feet)  100 Feet   x1   Assistive device  Rolling walker    Gait Pattern  Step-through pattern;Decreased stride length;Narrow base of support;Trunk flexed    Ambulation Surface  Level;Indoor      Neuro Re-ed    Neuro Re-ed Details   for balance/muscle re-ed/strengthening: on red mat on floor next to mat table: mini squats 2 sets of 10 reps with emphasis on equal LE weight bearing/full return to upright  posture; moving LE's out/in x 10 reps each side, then moving LE"s bwd/fwd x 10 reps each side. progressing to lateral stepping along mat for 3 laps each way. assist needed to lift LE full at foot with each ex. cues to maintain full upright posture and for weight shifitng with each ex.                                    Balance Exercises - 07/07/18 1348      Balance Exercises: Standing   SLS with Vectors  Foam/compliant surface;Upper extremity assist 1;Other reps (comment);Limitations    Other Standing Exercises  seated with feet across half foam roll (flat side down): sit<>stand x 5 reps with UE support to stabilize on chair back with standing and to slow descent with sitting back down..      Balance Exercises: Standing   SLS with Vectors Limitations  stainding on 1 inch foam with 3 color circles in front: toe taps to each one across the front x 4 laps with each LE. cues for weight shifting, to engage/tighten quads for stance stability and for weight shifitng when tapping bubbles. min guard to min assist with occasional UE touch to stable surface for balance.                 PT Short Term Goals - 07/05/18 1301      PT SHORT TERM GOAL #1   Title  Patient and husband demonstrate understanding of updated HEP. (All updated STGs Target Date: 07/08/2018)    Baseline  07/05/18: met with current HEP. also discussed return to community fitness with silver sneakers.    Time  --    Period  --    Status  Achieved    Target Date  07/08/18      PT SHORT TERM GOAL #2   Title  Patient ambulates 100' with cane & AFO with minA.     Baseline  07/05/18: met today    Time  --    Period  --    Status  Achieved    Target Date  07/08/18      PT SHORT TERM GOAL #3   Title  Patient negotiates ramps & curbs with cane & AFO with modA.     Baseline  07/05/18: met today    Time  --    Period  --    Status  Achieved    Target Date  07/08/18      PT SHORT TERM GOAL #4   Title  Berg Balance >/= 30/56  Baseline  07/05/2018: 47/56 scored today    Status  Achieved    Target Date  07/08/18        PT Long Term Goals - 05/11/18 1213      PT LONG TERM GOAL #1   Title  Patient verbalizes understanding of fall prevention strategies including assistive devices and any percautions that still exist. (All LTGs Target Dates: 08/05/2018)    Time  3    Period  Months    Status  New    Target Date  08/05/18      PT LONG TERM GOAL #2   Title  Patient verbalizes & demonstrates understanding of ongoing HEP / fitness plan including returning gym.     Time  3    Period  Months    Target Date  08/05/18      PT LONG TERM GOAL #3   Title  Berg Balance >36/56 to indicate lower fall risk.     Time  3    Period  Months    Target Date  08/05/18      PT LONG TERM GOAL #4   Title  Patient ambulates >300' with LRAD modified independent for community mobility.     Time  3    Period  Months    Status  New    Target Date  08/05/18      PT LONG TERM GOAL #5   Title  Patient negotiates ramps, curbs & stairs with LRAD modified independent to enable community access.     Time  3    Period  Months    Status  New    Target Date  08/05/18            Plan - 07/07/18 1325    Clinical Impression Statement  Today's skilled session continued to address strenghening, balance and worked on transitional movements with only fatigue reported. Rest breaks taken throughout session. The pt is progressing toward goals and should benefit from continued PT to progress toward unmet goals.    Rehab Potential  Good    PT Frequency  2x / week    PT Duration  12 weeks    PT Treatment/Interventions  ADLs/Self Care Home Management;Canalith Repostioning;DME Instruction;Gait training;Stair training;Functional mobility training;Therapeutic activities;Therapeutic exercise;Balance training;Neuromuscular re-education;Patient/family education;Orthotic Fit/Training;Vestibular    PT Next Visit Plan  continue with  gait training with  single tip cane, continue with bil LE strengthening- step ups, step downs, lunges, leg press;  continue with balance reactions/stepping strategies on compliant surfaces    PT Home Exercise Plan  [id number]    Consulted and Agree with Plan of Care  Patient;Family member/caregiver    Family Member Consulted  husband, Lavona Norsworthy       Patient will benefit from skilled therapeutic intervention in order to improve the following deficits and impairments:  Abnormal gait, Decreased activity tolerance, Decreased balance, Decreased endurance, Decreased knowledge of use of DME, Decreased knowledge of precautions, Decreased mobility, Decreased strength, Dizziness, Postural dysfunction, Pain  Visit Diagnosis: Muscle weakness (generalized)  Unsteadiness on feet  Other abnormalities of gait and mobility     Problem List Patient Active Problem List   Diagnosis Date Noted  . Neurogenic bladder 06/29/2018  . Essential hypertension   . Cervical spondylosis without myelopathy   . Steroid-induced hyperglycemia   . Postoperative pain   . Hyponatremia   . Leucocytosis   . Thoracic myelopathy 04/22/2018  . ALLERGIC RHINITIS 01/19/2007    Willow Ora, PTA, CLT Outpatient  Neuro [facility] [address] [place], [place] [address] [phone] 07/08/18, 4:27 PM   Name: Cheryl Pearson MRN: [id number] Date of Birth: 11-Feb-1941

## 2018-07-12 ENCOUNTER — Encounter: Payer: Self-pay | Admitting: Occupational Therapy

## 2018-07-12 ENCOUNTER — Ambulatory Visit: Payer: Medicare Other | Admitting: Physical Therapy

## 2018-07-12 ENCOUNTER — Encounter: Payer: Self-pay | Admitting: Physical Therapy

## 2018-07-12 ENCOUNTER — Ambulatory Visit: Payer: Medicare Other | Admitting: Occupational Therapy

## 2018-07-12 DIAGNOSIS — M6281 Muscle weakness (generalized): Secondary | ICD-10-CM | POA: Diagnosis not present

## 2018-07-12 DIAGNOSIS — R2681 Unsteadiness on feet: Secondary | ICD-10-CM

## 2018-07-12 DIAGNOSIS — R2689 Other abnormalities of gait and mobility: Secondary | ICD-10-CM

## 2018-07-12 DIAGNOSIS — M25511 Pain in right shoulder: Secondary | ICD-10-CM

## 2018-07-12 NOTE — Therapy (Signed)
[facility] [address] [place], [place], [address] Phone: [phone]   Fax:  [phone]  Physical Therapy Treatment  Patient Details  Name: Cheryl Pearson MRN: [id number] Date of Birth: [birth year] Referring Provider (PT): Alger Simons, MD   Encounter Date: 07/12/2018  PT End of Session - 07/12/18 1325    Visit Number  18    Number of Visits  25    Date for PT Re-Evaluation  07/08/18    Authorization Type  [employer] Medicare    PT Start Time  1318    PT Stop Time  1400    PT Time Calculation (min)  42 min    Equipment Utilized During Treatment  Gait belt    Activity Tolerance  Patient tolerated treatment well;No increased pain    Behavior During Therapy  WFL for tasks assessed/performed       Past Medical History:  Diagnosis Date  . Anxiety   . Arthritis   . Colitis   . Early age-related macular degeneration   . Hernia   . Hypertension   . Infertility, female   . Menopausal symptoms   . Rosacea   . Thyroid disease 1968   hypothyroidism treated with meds for short time then all labs normal    Past Surgical History:  Procedure Laterality Date  . bone spur Left   . COLONOSCOPY  11/2011   polyp, ulcerative coliits inactive  . COLONOSCOPY  2005  . COLONOSCOPY  1970's   diagnosed with ulcerative colitis  . ESOPHAGOGASTRODUODENOSCOPY (EGD) WITH PROPOFOL N/A 11/20/2014   Procedure: ESOPHAGOGASTRODUODENOSCOPY (EGD) WITH PROPOFOL;  Surgeon: Laurence Spates, MD;  Location: [facility];  Service: Endoscopy;  Laterality: N/A;  . INGUINAL HERNIA REPAIR Right 36 mos old  . LAPAROSCOPIC CHOLECYSTECTOMY  09/1998  . SAVORY DILATION N/A 11/20/2014   Procedure: SAVORY DILATION;  Surgeon: Laurence Spates, MD;  Location: [facility];  Service: Endoscopy;  Laterality: N/A;  . WISDOM TOOTH EXTRACTION  1968    There were no vitals filed for this visit.  Subjective Assessment - 07/12/18 1323    Subjective  No new compliants. No  falls or pain to report. Did go for a long walk on unven terrain at the [employer] and was sore the next day. Also signed up at [employer] adn had a good workout there (not able to go back at this time as they are closing for virus prevention)    Patient is accompained by:  Family member    Pertinent History  Thoracic myelopathy, T1-2 laminectomy, arthritis, HTN,     Limitations  Lifting;Standing;Walking;House hold activities    Patient Stated Goals  To return to as much activity as possible and get return to muscles.     Currently in Pain?  No/denies    Pain Score  0-No pain            [facility] Adult PT Treatment/Exercise - 07/12/18 1326      Transfers   Transfers  Sit to Stand;Stand to Sit;Stand Pivot Transfers    Sit to Stand  5: Supervision;With upper extremity assist;With armrests;From chair/3-in-1    Stand to Sit  5: Supervision;With upper extremity assist;With armrests;To chair/3-in-1      Ambulation/Gait   Ambulation/Gait  Yes    Ambulation/Gait Assistance  4: Min assist;3: Mod assist    Ambulation/Gait Assistance Details  supervision with RW; min to mod assist with use of cane with cues on posture, knee extension as pt tends to keep  bil knees flexed with gait. cues for incr left step length and cane placement so not to step on it with right foot with stepping. pt educated on not keeping knees flexed with gait due to increased chance of buckling with this.     Ambulation Distance (Feet)  80 Feet   x2 reps with cane; into gym with RW   Assistive device  Rolling walker;Straight cane   cane with rubber quad tip   Gait Pattern  Step-through pattern;Decreased stride length;Narrow base of support;Trunk flexed    Ambulation Surface  Level;Indoor      High Level Balance   High Level Balance Activities  Side stepping;Marching forwards;Marching backwards;Tandem walking   tandem fwd/bwd   High Level Balance Comments  on blue mat in parallel bars: 3 laps each with light fingertip support on  bars, min guard to min assist for balance. cues on posture, form and to increase foot clearance/not slide the on mat.           Balance Exercises - 07/12/18 1337      Balance Exercises: Standing   Rockerboard  Anterior/posterior;Lateral;Head turns;EO;EC;30 seconds;10 reps    Balance Beam  standing across red beam with light UE support on bars: alternating fwd heel taps to floor/back onto beam, then alternating bwd toe taps to floor/back onto beam. cues on posture, to incr step height and for weight shifitng. min guard to min assist for balance.       Balance Exercises: Standing   Rebounder Limitations  performed both ways on balance board with hands hovering over bars: rocking the board with EO, progressing to EC with up to min assist for balance; holding the board steady: EC no head movements, progressing to EC head movements left<>right, then up<>down. min assist for balance with cues on posture/weight shifting to assist with balance.           PT Short Term Goals - 07/05/18 1301      PT SHORT TERM GOAL #1   Title  Patient and husband demonstrate understanding of updated HEP. (All updated STGs Target Date: 07/08/2018)    Baseline  07/05/18: met with current HEP. also discussed return to community fitness with silver sneakers.    Time  --    Period  --    Status  Achieved    Target Date  07/08/18      PT SHORT TERM GOAL #2   Title  Patient ambulates 100' with cane & AFO with minA.     Baseline  07/05/18: met today    Time  --    Period  --    Status  Achieved    Target Date  07/08/18      PT SHORT TERM GOAL #3   Title  Patient negotiates ramps & curbs with cane & AFO with modA.     Baseline  07/05/18: met today    Time  --    Period  --    Status  Achieved    Target Date  07/08/18      PT SHORT TERM GOAL #4   Title  Berg Balance >/= 30/56    Baseline  07/05/2018: 47/56 scored today    Status  Achieved    Target Date  07/08/18        PT Long Term Goals - 05/11/18  1213      PT LONG TERM GOAL #1   Title  Patient verbalizes understanding of fall prevention strategies including assistive devices and  any percautions that still exist. (All LTGs Target Dates: 08/05/2018)    Time  3    Period  Months    Status  New    Target Date  08/05/18      PT LONG TERM GOAL #2   Title  Patient verbalizes & demonstrates understanding of ongoing HEP / fitness plan including returning gym.     Time  3    Period  Months    Target Date  08/05/18      PT LONG TERM GOAL #3   Title  Berg Balance >36/56 to indicate lower fall risk.     Time  3    Period  Months    Target Date  08/05/18      PT LONG TERM GOAL #4   Title  Patient ambulates >300' with LRAD modified independent for community mobility.     Time  3    Period  Months    Status  New    Target Date  08/05/18      PT LONG TERM GOAL #5   Title  Patient negotiates ramps, curbs & stairs with LRAD modified independent to enable community access.     Time  3    Period  Months    Status  New    Target Date  08/05/18            Plan - 07/12/18 1326    Clinical Impression Statement  Today's skilled session continued to focus on gait with cane, LE strengthening and balance reactions. Increased assistance needed with cane today vs previous sessions. Pt stating she felt tired today, could be why. The pt is progressing toward goals and should benefit from continued PT to progress toward unmet goals.     Rehab Potential  Good    PT Frequency  2x / week    PT Duration  12 weeks    PT Treatment/Interventions  ADLs/Self Care Home Management;Canalith Repostioning;DME Instruction;Gait training;Stair training;Functional mobility training;Therapeutic activities;Therapeutic exercise;Balance training;Neuromuscular re-education;Patient/family education;Orthotic Fit/Training;Vestibular    PT Next Visit Plan  continue with  gait training with single tip cane, continue with bil LE strengthening- step ups, step downs, lunges,  leg press;  continue with balance reactions/stepping strategies on compliant surfaces    PT Home Exercise Plan  [id number]    Consulted and Agree with Plan of Care  Patient;Family member/caregiver    Family Member Consulted  husband, Iriana Artley       Patient will benefit from skilled therapeutic intervention in order to improve the following deficits and impairments:  Abnormal gait, Decreased activity tolerance, Decreased balance, Decreased endurance, Decreased knowledge of use of DME, Decreased knowledge of precautions, Decreased mobility, Decreased strength, Dizziness, Postural dysfunction, Pain  Visit Diagnosis: Muscle weakness (generalized)  Other abnormalities of gait and mobility  Unsteadiness on feet     Problem List Patient Active Problem List   Diagnosis Date Noted  . Neurogenic bladder 06/29/2018  . Essential hypertension   . Cervical spondylosis without myelopathy   . Steroid-induced hyperglycemia   . Postoperative pain   . Hyponatremia   . Leucocytosis   . Thoracic myelopathy 04/22/2018  . ALLERGIC RHINITIS 01/19/2007    Willow Ora, PTA, [facility] [address] [place], [place] [address] [phone] 07/12/18, 7:25 PM   Name: Cheryl Pearson MRN: [id number] Date of Birth: 1941/01/13

## 2018-07-12 NOTE — Therapy (Signed)
[facility] [address] [place], [place], [address] Phone: [phone]   Fax:  [phone]  Occupational Therapy Treatment  Patient Details  Name: Cheryl Pearson MRN: [id number] Date of Birth: 1940-08-30 No data recorded  Encounter Date: 07/12/2018  OT End of Session - 07/12/18 1410    Visit Number  14    Number of Visits  25    Date for OT Re-Evaluation  08/08/18    Authorization Type  [employer] [place], no visit limit    Authorization - Visit Number  14    Authorization - Number of Visits  20    OT Start Time  1407    OT Stop Time  1450    OT Time Calculation (min)  43 min    Activity Tolerance  Patient tolerated treatment well    Behavior During Therapy  WFL for tasks assessed/performed       Past Medical History:  Diagnosis Date  . Anxiety   . Arthritis   . Colitis   . Early age-related macular degeneration   . Hernia   . Hypertension   . Infertility, female   . Menopausal symptoms   . Rosacea   . Thyroid disease 1968   hypothyroidism treated with meds for short time then all labs normal    Past Surgical History:  Procedure Laterality Date  . bone spur Left   . COLONOSCOPY  11/2011   polyp, ulcerative coliits inactive  . COLONOSCOPY  2005  . COLONOSCOPY  1970's   diagnosed with ulcerative colitis  . ESOPHAGOGASTRODUODENOSCOPY (EGD) WITH PROPOFOL N/A 11/20/2014   Procedure: ESOPHAGOGASTRODUODENOSCOPY (EGD) WITH PROPOFOL;  Surgeon: Laurence Spates, MD;  Location: [facility];  Service: Endoscopy;  Laterality: N/A;  . INGUINAL HERNIA REPAIR Right 25 mos old  . LAPAROSCOPIC CHOLECYSTECTOMY  09/1998  . SAVORY DILATION N/A 11/20/2014   Procedure: SAVORY DILATION;  Surgeon: Laurence Spates, MD;  Location: [facility];  Service: Endoscopy;  Laterality: N/A;  . WISDOM TOOTH EXTRACTION  1968    There were no vitals filed for this visit.  Subjective Assessment - 07/12/18 1408    Subjective   Pt reports  she enjoyed her last OT session, reports it gave her a good workout.     Patient is accompanied by:  Family member   husband    Pertinent History  cervical throracic myelopathy s/p decompression T1-T2 (surgery 04/22/18); PMH:  anxiety, arthritis, colitis, Macular degeneration    Limitations   fall risk, cleared of back precautions, can lift what she feels comfortable with    Patient Stated Goals  return to driving, volunteering/helping friends, be able to travel and go out to eat    Currently in Pain?  No/denies       Discussed community fitness activities that pt did at gym (only able to go 1x due to closure).  Pt verbalized appropriate activities that she tried and reports that it went well.  Quadruped, forward/back wt shifts and alternating UE lifts for incr core/shoulder stability and strength with min v.c.  In tall kneeling, shoulder flex and diagonals closed chain with BUEs with ball then pushing ball forward/back on mat for incr core stability and UE ROM.  Standing unsupported with CGA to perform functional reaching with wt. Shift outside base of support with each UE to simulate IADL task.    Arm bike x 4 min level 1 for reciprocal movement/conditioning without rest.  In prone, scapular retraction with UEs in extension  and abduction x10 each.       OT Short Term Goals - 07/12/18 1509      OT SHORT TERM GOAL #1   Title  Pt will be independent with initial HEP.--check STG 06/21/18    Time  6    Period  Weeks    Status  Achieved      OT SHORT TERM GOAL #2   Title  Pt will perform simple cooking task mod I.    Baseline  06/06/18 revised as pt no longer has back precautions.     Time  6    Period  Weeks    Status  On-going    06/20/18: not performing.  06/28/18:  not fully met, simple cold meal prep.  07/05/18 has simulated in clinic     OT [place] #3   Title  Pt will perform simple home maintenance task mod I.    Baseline  06/06/18 revised as pt no longer has back  precautions.     Time  6    Period  Weeks    Status  Achieved   06/20/18: not performing.  06/23/18:  only washing dishes, picking up a little. 07/05/18  met in clinic     OT [place] #4   Title  Pt will verbalize understanding of energy conservation strategies for ADLs/IADLs.    Time  6    Period  Weeks    Status  Achieved   06/23/18:  not fully met.  07/05/18:  met.     OT SHORT TERM GOAL #5   Title  Pt will report R shoulder pain less than or equal to 4/10 for ADLs.    Time  6    Period  Weeks    Status  Achieved   06/20/18     OT SHORT TERM GOAL #6   Title  Pt will improve R dominant hand grip strength by at least 5lbs to assist with IADLs.    Baseline  48lbs    Time  6    Period  Weeks    Status  On-going   06/28/18:  51lbs       OT Long Term Goals - 05/10/18 1526      OT LONG TERM GOAL #1   Title  Pt will be independent with UE strengthening HEP.--check LTGs 08/08/18    Time  12    Period  Weeks    Status  New      OT LONG TERM GOAL #2   Title  Pt will perform mod complex cooking/cleaning tasks mod I.    Time  12    Period  Weeks    Status  New      OT LONG TERM GOAL #3   Title  Pt will be able to stand to perform IADL/leisure activity for at least 40 min consistently without rest break.    Time  12    Period  Weeks    Status  New      OT LONG TERM GOAL #4   Title  Pt will report R shoulder pain consistently less than or equal to 3/10 for ADLs/IADLs.    Time  12    Period  Weeks    Status  New            Plan - 07/12/18 1506    Clinical Impression Statement  Pt making good progress towards goals, improvements noted with core strength, standing balance, endurance, and  overhead reach.  Pt was able to go to gym 1x before closure and did well.    Occupational Profile and client history currently impacting functional performance  Pt was independent prior to symptoms beginning mid November.  Pt currently unable to drive or do community/leisure  activities independently and is not performing household IADLs.      Occupational performance deficits (Please refer to evaluation for details):  ADL's;IADL's;Leisure;Social Participation    Body Structure / Function / Physical Skills  ADL;ROM;Strength;Balance;Coordination;Body mechanics;Mobility;UE functional use;Gait;Endurance;Flexibility;IADL    OT Frequency  2x / week    OT Duration  12 weeks    OT Treatment/Interventions  Self-care/ADL training;Electrical Stimulation;Therapeutic exercise;Patient/family education;Neuromuscular education;Moist Heat;Aquatic Therapy;Energy conservation;Therapist, nutritional;Therapeutic activities;Balance training;Manual Therapy;DME and/or AE instruction;Passive range of motion;Ultrasound;Cryotherapy;Contrast Bath    Plan  continue to addres IADLs PRN, endurance, core stability, grip strength for increased ease with functional task completion    Consulted and Agree with Plan of Care  Patient       Patient will benefit from skilled therapeutic intervention in order to improve the following deficits and impairments:  Body Structure / Function / Physical Skills  Visit Diagnosis: Muscle weakness (generalized)  Other abnormalities of gait and mobility  Acute pain of right shoulder  Unsteadiness on feet    Problem List Patient Active Problem List   Diagnosis Date Noted  . Neurogenic bladder 06/29/2018  . Essential hypertension   . Cervical spondylosis without myelopathy   . Steroid-induced hyperglycemia   . Postoperative pain   . Hyponatremia   . Leucocytosis   . Thoracic myelopathy 04/22/2018  . ALLERGIC RHINITIS 01/19/2007    [facility] 07/12/2018, 3:19 PM  [facility] [address] [place], [place], [address] Phone: [phone]   Fax:  [phone]  Name: Cheryl Pearson MRN: [id number] Date of Birth: Mar 31, 1941   Vianne Bulls, OTR/L [facility] [address] [place], [place]  [address] [phone] phone [phone] 07/12/18 3:19 PM

## 2018-07-14 ENCOUNTER — Ambulatory Visit: Payer: Medicare Other | Admitting: Occupational Therapy

## 2018-07-14 ENCOUNTER — Other Ambulatory Visit: Payer: Self-pay

## 2018-07-14 ENCOUNTER — Encounter: Payer: Self-pay | Admitting: Physical Therapy

## 2018-07-14 ENCOUNTER — Encounter: Payer: Self-pay | Admitting: Occupational Therapy

## 2018-07-14 ENCOUNTER — Ambulatory Visit: Payer: Medicare Other | Admitting: Physical Therapy

## 2018-07-14 DIAGNOSIS — R2681 Unsteadiness on feet: Secondary | ICD-10-CM

## 2018-07-14 DIAGNOSIS — R2689 Other abnormalities of gait and mobility: Secondary | ICD-10-CM

## 2018-07-14 DIAGNOSIS — M6281 Muscle weakness (generalized): Secondary | ICD-10-CM | POA: Diagnosis not present

## 2018-07-14 DIAGNOSIS — M25511 Pain in right shoulder: Secondary | ICD-10-CM

## 2018-07-14 NOTE — Therapy (Signed)
[facility] [address] [place], [place], [address] Phone: [phone]   Fax:  [phone]  Occupational Therapy Treatment  Patient Details  Name: Cheryl Pearson MRN: [id number] Date of Birth: [birth year] No data recorded  Encounter Date: 07/14/2018  OT End of Session - 07/14/18 1530    Visit Number  15    Number of Visits  25    Date for OT Re-Evaluation  08/08/18    Authorization Type  [place] [place], no visit limit    Authorization - Visit Number  15    Authorization - Number of Visits  20    OT Start Time  1316    OT Stop Time  1400    OT Time Calculation (min)  44 min    Activity Tolerance  Patient tolerated treatment well    Behavior During Therapy  WFL for tasks assessed/performed       Past Medical History:  Diagnosis Date  . Anxiety   . Arthritis   . Colitis   . Early age-related macular degeneration   . Hernia   . Hypertension   . Infertility, female   . Menopausal symptoms   . Rosacea   . Thyroid disease 1968   hypothyroidism treated with meds for short time then all labs normal    Past Surgical History:  Procedure Laterality Date  . bone spur Left   . COLONOSCOPY  11/2011   polyp, ulcerative coliits inactive  . COLONOSCOPY  2005  . COLONOSCOPY  1970's   diagnosed with ulcerative colitis  . ESOPHAGOGASTRODUODENOSCOPY (EGD) WITH PROPOFOL N/A 11/20/2014   Procedure: ESOPHAGOGASTRODUODENOSCOPY (EGD) WITH PROPOFOL;  Surgeon: Laurence Spates, MD;  Location: [facility];  Service: Endoscopy;  Laterality: N/A;  . INGUINAL HERNIA REPAIR Right 14 mos old  . LAPAROSCOPIC CHOLECYSTECTOMY  09/1998  . SAVORY DILATION N/A 11/20/2014   Procedure: SAVORY DILATION;  Surgeon: Laurence Spates, MD;  Location: [facility];  Service: Endoscopy;  Laterality: N/A;  . WISDOM TOOTH EXTRACTION  1968    There were no vitals filed for this visit.  Subjective Assessment - 07/14/18 1432    Subjective   Patient  reports that she thinks she can do laundry, but thinks vaccuuming may be more challenging.  Husband has purcahsed [employer].      Patient is accompanied by:  Family member    Pertinent History  cervical throracic myelopathy s/p decompression T1-T2 (surgery 04/22/18); PMH:  anxiety, arthritis, colitis, Macular degeneration    Limitations   fall risk, cleared of back precautions, can lift what she feels comfortable with    Patient Stated Goals  return to driving, volunteering/helping friends, be able to travel and go out to eat    Currently in Pain?  No/denies    Pain Score  0-No pain                   OT Treatments/Exercises (OP) - 07/14/18 0001      ADLs   Functional Mobility  Worked on dynamic stand balance. Patient with proximal weakness which impeded stand balance.  Worked to improve RLE activation along with core activation toward upright postural set with standing weight shifts.  Patient had difficulty maintaining weight on active RLE to unweight LLE witout slight UE support.  Patient with tendency to rotate trunk toward left side with effort to weight shift to right.  Patient able to correct  trunk alignment after initial facilitation,and with verbal / visual cues (  mirror)      Home Maintenance  Patient expresses concern regarding her ablility to push a vaccuum.  She and her husband have discussed sitting down to use vaccuum, and have purchased more lightweight vaccuum to increase independence.        Neurological Re-education Exercises   Other Exercises 1  Worked on lower trunk initiated weight shifts with UE holding ball at eye level.  Working to increase sustained activation in proxiaml UE's while activating hips/pelvis into supprot surface in various degrees of motion.  Patient with tendency to hang on anterior ligaments in right hip, but with facilitation to better align hip and trunk, able to activate posterior hip muscles for active stance on RLE.  This wil  be key for functions requiring more dynmaic standing balance.      Other Exercises 2  In supine worked on yellow theraband - resistive shoulder flexion with elbow extension, and abduction to overhead.                 OT Short Term Goals - 07/12/18 1509      OT SHORT TERM GOAL #1   Title  Pt will be independent with initial HEP.--check STG 06/21/18    Time  6    Period  Weeks    Status  Achieved      OT SHORT TERM GOAL #2   Title  Pt will perform simple cooking task mod I.    Baseline  06/06/18 revised as pt no longer has back precautions.     Time  6    Period  Weeks    Status  On-going    06/20/18: not performing.  06/28/18:  not fully met, simple cold meal prep.  07/05/18 has simulated in clinic     OT [place] #3   Title  Pt will perform simple home maintenance task mod I.    Baseline  06/06/18 revised as pt no longer has back precautions.     Time  6    Period  Weeks    Status  Achieved   06/20/18: not performing.  06/23/18:  only washing dishes, picking up a little. 07/05/18  met in clinic     OT [place] #4   Title  Pt will verbalize understanding of energy conservation strategies for ADLs/IADLs.    Time  6    Period  Weeks    Status  Achieved   06/23/18:  not fully met.  07/05/18:  met.     OT SHORT TERM GOAL #5   Title  Pt will report R shoulder pain less than or equal to 4/10 for ADLs.    Time  6    Period  Weeks    Status  Achieved   06/20/18     OT SHORT TERM GOAL #6   Title  Pt will improve R dominant hand grip strength by at least 5lbs to assist with IADLs.    Baseline  48lbs    Time  6    Period  Weeks    Status  On-going   06/28/18:  51lbs       OT Long Term Goals - 05/10/18 1526      OT LONG TERM GOAL #1   Title  Pt will be independent with UE strengthening HEP.--check LTGs 08/08/18    Time  12    Period  Weeks    Status  New      OT LONG TERM GOAL #2  Title  Pt will perform mod complex cooking/cleaning tasks mod I.    Time  12     Period  Weeks    Status  New      OT LONG TERM GOAL #3   Title  Pt will be able to stand to perform IADL/leisure activity for at least 40 min consistently without rest break.    Time  12    Period  Weeks    Status  New      OT LONG TERM GOAL #4   Title  Pt will report R shoulder pain consistently less than or equal to 3/10 for ADLs/IADLs.    Time  12    Period  Weeks    Status  New            Plan - 07/14/18 1530    Clinical Impression Statement  Patient continuing to show functional progress as strength and overall conditioning improves.  Patient needs to continue to work on proximal strengtheningand dynamic stand balcne tasks.      Occupational Profile and client history currently impacting functional performance  Pt was independent prior to symptoms beginning mid November.  Pt currently unable to drive or do community/leisure activities independently and is not performing household IADLs.      Occupational performance deficits (Please refer to evaluation for details):  ADL's;IADL's;Leisure;Social Participation    Body Structure / Function / Physical Skills  ADL;ROM;Strength;Balance;Coordination;Body mechanics;Mobility;UE functional use;Gait;Endurance;Flexibility;IADL    Rehab Potential  Good    Clinical Decision Making  Limited treatment options, no task modification necessary    OT Frequency  2x / week    OT Duration  12 weeks    OT Treatment/Interventions  Self-care/ADL training;Electrical Stimulation;Therapeutic exercise;Patient/family education;Neuromuscular education;Moist Heat;Aquatic Therapy;Energy conservation;Therapist, nutritional;Therapeutic activities;Balance training;Manual Therapy;DME and/or AE instruction;Passive range of motion;Ultrasound;Cryotherapy;Contrast Bath    Plan  continue to addres IADLs PRN, endurance, core stability, grip strength for increased ease with functional task completion    Recommended Other Services  current with PT    Consulted and  Agree with Plan of Care  Patient       Patient will benefit from skilled therapeutic intervention in order to improve the following deficits and impairments:  Body Structure / Function / Physical Skills  Visit Diagnosis: Muscle weakness (generalized)  Acute pain of right shoulder  Unsteadiness on feet  Other abnormalities of gait and mobility    Problem List Patient Active Problem List   Diagnosis Date Noted  . Neurogenic bladder 06/29/2018  . Essential hypertension   . Cervical spondylosis without myelopathy   . Steroid-induced hyperglycemia   . Postoperative pain   . Hyponatremia   . Leucocytosis   . Thoracic myelopathy 04/22/2018  . ALLERGIC RHINITIS 01/19/2007    Mariah Milling, OTR/L 07/14/2018, 3:56 PM  [facility] [address] [place], [place], [address] Phone: [phone]   Fax:  [phone]  Name: Cheryl Pearson MRN: [id number] Date of Birth: 10/18/1940

## 2018-07-14 NOTE — Therapy (Signed)
[facility] [address] [place], [place], [address] Phone: [phone]   Fax:  [phone]  Physical Therapy Treatment  Patient Details  Name: Cheryl Pearson MRN: [id number] Date of Birth: [birth year] Referring Provider (PT): Alger Simons, MD   Encounter Date: 07/14/2018  PT End of Session - 07/14/18 1406    Visit Number  19    Number of Visits  25    Date for PT Re-Evaluation  07/08/18    Authorization Type  [employer] Medicare    PT Start Time  1402    PT Stop Time  1445    PT Time Calculation (min)  43 min    Equipment Utilized During Treatment  Gait belt    Activity Tolerance  Patient tolerated treatment well;No increased pain    Behavior During Therapy  WFL for tasks assessed/performed       Past Medical History:  Diagnosis Date  . Anxiety   . Arthritis   . Colitis   . Early age-related macular degeneration   . Hernia   . Hypertension   . Infertility, female   . Menopausal symptoms   . Rosacea   . Thyroid disease 1968   hypothyroidism treated with meds for short time then all labs normal    Past Surgical History:  Procedure Laterality Date  . bone spur Left   . COLONOSCOPY  11/2011   polyp, ulcerative coliits inactive  . COLONOSCOPY  2005  . COLONOSCOPY  1970's   diagnosed with ulcerative colitis  . ESOPHAGOGASTRODUODENOSCOPY (EGD) WITH PROPOFOL N/A 11/20/2014   Procedure: ESOPHAGOGASTRODUODENOSCOPY (EGD) WITH PROPOFOL;  Surgeon: Laurence Spates, MD;  Location: [facility];  Service: Endoscopy;  Laterality: N/A;  . INGUINAL HERNIA REPAIR Right 32 mos old  . LAPAROSCOPIC CHOLECYSTECTOMY  09/1998  . SAVORY DILATION N/A 11/20/2014   Procedure: SAVORY DILATION;  Surgeon: Laurence Spates, MD;  Location: [facility];  Service: Endoscopy;  Laterality: N/A;  . WISDOM TOOTH EXTRACTION  1968    There were no vitals filed for this visit.  Subjective Assessment - 07/14/18 1404    Subjective  No complaints. No fall  or pain to report. Does report some occasional hip and shoulder pain. Has been walking up/down driveway at home.     Patient is accompained by:  Family member    Pertinent History  Thoracic myelopathy, T1-2 laminectomy, arthritis, HTN,     Limitations  Lifting;Standing;Walking;House hold activities    Patient Stated Goals  To return to as much activity as possible and get return to muscles.     Currently in Pain?  No/denies    Pain Score  0-No pain           [facility] Adult PT Treatment/Exercise - 07/14/18 1407      Transfers   Transfers  Sit to Stand;Stand to Sit    Sit to Stand  5: Supervision;With upper extremity assist;With armrests;From chair/3-in-1    Stand to Sit  5: Supervision;With upper extremity assist;With armrests;To chair/3-in-1      Ambulation/Gait   Ambulation/Gait  Yes    Ambulation/Gait Assistance  4: Min assist;3: Mod assist    Ambulation/Gait Assistance Details  continues to need cues on incr base of support, incr quad activation in stance, need for incr step length and for upright posture as she tends to be fwd flexed.  Ambulation Distance (Feet)  15 Feet   x2   Assistive device  Rolling walker;Straight cane    Gait Pattern  Step-through pattern;Decreased stride length;Narrow base of support;Trunk flexed    Ambulation Surface  Level;Indoor      Neuro Re-ed    Neuro Re-ed Details   for muscle re-ed/strengthening: seated on green air disc: lateral pelvic rocks, ant/post pelvic rocks both with emphasis on tall posture; alternating long arch quads with 3-5 sec holds with 2# ankle weight on left leg for 10 reps each side, then without ankle weight alternating marching x 10 reps each side with 3 sec holds, added contralateral UE raises for 10 more reps; with green theraband- rows for 10 reps, then alternating diagonals for 10 reps each side. cues for posture and abd bracing with all ex's performed on air disc.                                 Balance Exercises - 07/14/18 1442      Balance Exercises: Standing   Standing Eyes Closed  Narrow base of support (BOS);Foam/compliant surface;Other reps (comment);Time;Limitations    SLS with Vectors  Solid surface;Other reps (comment);Limitations   with cane support     Balance Exercises: Standing   SLS with Vectors Limitations  on floor with 2 foam bubbles in front with straight cane: alternating fwd toe taps to each, then alternating cross toe taps to each. cues on stance position, weight shifitng and to slow down for imrproved technique.                   Tandem Gait Limitations  on airex in corner with chair in front for safety: feet close together: EC no head movements, progressing to EC head movements left<>right, then up<>down. min guard to min assist for balance.            PT Short Term Goals - 07/05/18 1301      PT SHORT TERM GOAL #1   Title  Patient and husband demonstrate understanding of updated HEP. (All updated STGs Target Date: 07/08/2018)    Baseline  07/05/18: met with current HEP. also discussed return to community fitness with silver sneakers.    Time  --    Period  --    Status  Achieved    Target Date  07/08/18      PT SHORT TERM GOAL #2   Title  Patient ambulates 100' with cane & AFO with minA.     Baseline  07/05/18: met today    Time  --    Period  --    Status  Achieved    Target Date  07/08/18      PT SHORT TERM GOAL #3   Title  Patient negotiates ramps & curbs with cane & AFO with modA.     Baseline  07/05/18: met today    Time  --    Period  --    Status  Achieved    Target Date  07/08/18      PT SHORT TERM GOAL #4   Title  Berg Balance >/= 30/56    Baseline  07/05/2018: 47/56 scored today    Status  Achieved    Target Date  07/08/18        PT Long Term Goals - 05/11/18 1213      PT LONG TERM GOAL #1   Title  Patient verbalizes  understanding of fall prevention strategies including assistive devices and any percautions that  still exist. (All LTGs Target Dates: 08/05/2018)    Time  3    Period  Months    Status  New    Target Date  08/05/18      PT LONG TERM GOAL #2   Title  Patient verbalizes & demonstrates understanding of ongoing HEP / fitness plan including returning gym.     Time  3    Period  Months    Target Date  08/05/18      PT LONG TERM GOAL #3   Title  Berg Balance >36/56 to indicate lower fall risk.     Time  3    Period  Months    Target Date  08/05/18      PT LONG TERM GOAL #4   Title  Patient ambulates >300' with LRAD modified independent for community mobility.     Time  3    Period  Months    Status  New    Target Date  08/05/18      PT LONG TERM GOAL #5   Title  Patient negotiates ramps, curbs & stairs with LRAD modified independent to enable community access.     Time  3    Period  Months    Status  New    Target Date  08/05/18            Plan - 07/14/18 1406    Clinical Impression Statement  Today's skilled session continued to focus on balance reactions and gait with cane. The pt continues to need cues for quad activation in stance for improved stability. The pt is slowly making progress toward goals and should benefit from continued PT to progress toward unmet goals.      Rehab Potential  Good    PT Frequency  2x / week    PT Duration  12 weeks    PT Treatment/Interventions  ADLs/Self Care Home Management;Canalith Repostioning;DME Instruction;Gait training;Stair training;Functional mobility training;Therapeutic activities;Therapeutic exercise;Balance training;Neuromuscular re-education;Patient/family education;Orthotic Fit/Training;Vestibular    PT Next Visit Plan  10th visit progress note due; continue with  gait training with single tip cane, continue with bil LE strengthening- step ups, step downs, lunges, leg press;  continue with balance reactions/stepping strategies on compliant surfaces    PT Home Exercise Plan  [id number]    Consulted and Agree with Plan of Care   Patient;Family member/caregiver    Family Member Consulted  husband, Evvie Behrmann       Patient will benefit from skilled therapeutic intervention in order to improve the following deficits and impairments:  Abnormal gait, Decreased activity tolerance, Decreased balance, Decreased endurance, Decreased knowledge of use of DME, Decreased knowledge of precautions, Decreased mobility, Decreased strength, Dizziness, Postural dysfunction, Pain  Visit Diagnosis: Muscle weakness (generalized)  Other abnormalities of gait and mobility  Unsteadiness on feet     Problem List Patient Active Problem List   Diagnosis Date Noted  . Neurogenic bladder 06/29/2018  . Essential hypertension   . Cervical spondylosis without myelopathy   . Steroid-induced hyperglycemia   . Postoperative pain   . Hyponatremia   . Leucocytosis   . Thoracic myelopathy 04/22/2018  . ALLERGIC RHINITIS 01/19/2007    Willow Ora, PTA, [facility] [address] [place], [place] [address] [phone] 07/14/18, 9:49 PM   Name: Cheryl Pearson MRN: [id number] Date of Birth: 1940-11-20

## 2018-07-18 ENCOUNTER — Telehealth: Payer: Self-pay | Admitting: Physical Therapy

## 2018-07-18 NOTE — Telephone Encounter (Signed)
Cheryl Pearson was contacted today regarding the temporary closing of [facility] due to Covid-19.  Therapist left a message as to the purpose of the call and that the therapist will attempt again later today or tomorrow to touch base.    [facility] will follow up with patients when we are able to resume care.  Sallyanne Kuster, PTA, [facility] [address] [place], [place] [address] [phone] 07/18/18, 1:33 PM   [facility] [address] [place], [place]  [address] Phone:  [phone] Fax:  [phone] \

## 2018-07-19 ENCOUNTER — Ambulatory Visit: Payer: Medicare Other | Admitting: Physical Therapy

## 2018-07-19 ENCOUNTER — Ambulatory Visit: Payer: Medicare Other | Admitting: Occupational Therapy

## 2018-07-19 ENCOUNTER — Telehealth: Payer: Self-pay | Admitting: Physical Therapy

## 2018-07-19 NOTE — Telephone Encounter (Signed)
Cheryl Pearson was contacted today regarding the temporary closing of [facility] due to Covid-19.  Therapist attempted to reach patient again today. Left message that if patient has any questions on her home program or issues to call and leave a message. Informed her that we will have limited staff checking messages and someone will get back to her. Advised her that if any other schedule changes are to occur that some one from the front office will call her.     [facility] will follow up with patients when we are able to resume care.  Sallyanne Kuster, PTA, [facility] [address], [address] [place], [place] [address] [phone] 07/19/18, 11:34 AM   [facility] [address] [place], [place]  [address] Phone:  [phone] Fax:  [phone] \

## 2018-07-21 ENCOUNTER — Ambulatory Visit: Payer: Medicare Other | Admitting: Occupational Therapy

## 2018-07-21 ENCOUNTER — Ambulatory Visit: Payer: Medicare Other | Admitting: Physical Therapy

## 2018-07-26 ENCOUNTER — Ambulatory Visit: Payer: Medicare Other | Admitting: Physical Therapy

## 2018-07-26 ENCOUNTER — Encounter: Payer: Medicare Other | Admitting: Occupational Therapy

## 2018-07-28 ENCOUNTER — Encounter: Payer: Medicare Other | Admitting: Occupational Therapy

## 2018-07-28 ENCOUNTER — Ambulatory Visit: Payer: Medicare Other | Admitting: Physical Therapy

## 2018-08-02 ENCOUNTER — Encounter: Payer: Medicare Other | Admitting: Occupational Therapy

## 2018-08-02 ENCOUNTER — Ambulatory Visit: Payer: Medicare Other | Admitting: Physical Therapy

## 2018-08-04 ENCOUNTER — Ambulatory Visit: Payer: Medicare Other | Admitting: Physical Therapy

## 2018-08-04 ENCOUNTER — Encounter: Payer: Medicare Other | Admitting: Occupational Therapy

## 2018-08-25 ENCOUNTER — Telehealth: Payer: Self-pay | Admitting: Occupational Therapy

## 2018-08-25 NOTE — Telephone Encounter (Signed)
OT had Left message regarding extended reduction of [facility] due to concerns for community transmission of COVID-19.  Notified pt we can be reached by telephone during limited business hours and that e-visits may be an option for continuing care.  Pt to return call if interested in e-visit or if questions/concerns.   Received message that pt was not interested in telehealth and would wait until center reopens.  Willa Frater, OTR/L [facility] [address] [place], [place]  [address] [phone] phone [phone] 08/02/18 2:12pm

## 2018-10-05 ENCOUNTER — Encounter: Payer: Medicare Other | Admitting: Physical Medicine & Rehabilitation

## 2018-11-30 ENCOUNTER — Encounter: Payer: Medicare Other | Admitting: Physical Medicine & Rehabilitation

## 2019-01-04 ENCOUNTER — Encounter: Payer: Medicare Other | Attending: Physical Medicine & Rehabilitation | Admitting: Physical Medicine & Rehabilitation

## 2019-01-04 ENCOUNTER — Other Ambulatory Visit: Payer: Self-pay

## 2019-01-04 ENCOUNTER — Encounter: Payer: Self-pay | Admitting: Physical Medicine & Rehabilitation

## 2019-01-04 VITALS — BP 158/87 | HR 81 | Temp 97.7°F | Ht 65.5 in | Wt 152.0 lb

## 2019-01-04 DIAGNOSIS — M47812 Spondylosis without myelopathy or radiculopathy, cervical region: Secondary | ICD-10-CM

## 2019-01-04 DIAGNOSIS — M4714 Other spondylosis with myelopathy, thoracic region: Secondary | ICD-10-CM | POA: Insufficient documentation

## 2019-01-04 NOTE — Progress Notes (Signed)
Subjective:    Patient ID: Cheryl Pearson, female    DOB: 10-Dec-1940, 78 y.o.   MRN: [id number]  HPI  Cheryl Pearson is here in follow up of her throacic SCI. She is walking a lot with her pronged cane on flat surfaces. She furniture walks at home and uses a walker on uneven  Surfaces. She still lacks position sense and balance. She hasn't fallen but her knees give out.   Her bladder is still frequent. She voids every 2-3 hours while awake    Pain Inventory Average Pain 0 Pain Right Now 0 My pain is na  In the last 24 hours, has pain interfered with the following? General activity 1 Relation with others 0 Enjoyment of life 0 What TIME of day is your pain at its worst? morning Sleep (in general) Good  Pain is worse with: na Pain improves with: na Relief from Meds: na  Mobility use a cane use a walker how many minutes can you walk? 20 ability to climb steps?  yes do you drive?  no  Function retired  Neuro/Psych weakness numbness tremor trouble walking  Prior Studies Any changes since last visit?  no  Physicians involved in your care Any changes since last visit?  no   Family History  Problem Relation Age of Onset  . Cancer Mother        uterine cancer  . Osteoarthritis Mother   . Rheum arthritis Mother   . COPD Mother   . COPD Father   . COPD Brother   . Cancer Maternal Aunt        colon cancer   Social History   Socioeconomic History  . Marital status: Married    Spouse name: Not on file  . Number of children: Not on file  . Years of education: Not on file  . Highest education level: Not on file  Occupational History  . Not on file  Social Needs  . Financial resource strain: Not on file  . Food insecurity    Worry: Not on file    Inability: Not on file  . Transportation needs    Medical: Not on file    Non-medical: Not on file  Tobacco Use  . Smoking status: Never Smoker  . Smokeless tobacco: Never Used  Substance and Sexual Activity  .  Alcohol use: Yes    Alcohol/week: 1.0 standard drinks    Types: 1 Standard drinks or equivalent per week    Comment: occasionaly  . Drug use: No  . Sexual activity: Yes    Birth control/protection: Post-menopausal  Lifestyle  . Physical activity    Days per week: Not on file    Minutes per session: Not on file  . Stress: Not on file  Relationships  . Social Musician on phone: Not on file    Gets together: Not on file    Attends religious service: Not on file    Active member of club or organization: Not on file    Attends meetings of clubs or organizations: Not on file    Relationship status: Not on file  Other Topics Concern  . Not on file  Social History Narrative  . Not on file   Past Surgical History:  Procedure Laterality Date  . bone spur Left   . COLONOSCOPY  11/2011   polyp, ulcerative coliits inactive  . COLONOSCOPY  2005  . COLONOSCOPY  1970's   diagnosed with ulcerative colitis  .  ESOPHAGOGASTRODUODENOSCOPY (EGD) WITH PROPOFOL N/A 11/20/2014   Procedure: ESOPHAGOGASTRODUODENOSCOPY (EGD) WITH PROPOFOL;  Surgeon: Carman ChingJames Edwards, MD;  [facility]: [facility];  Service: Endoscopy;  Laterality: N/A;  . INGUINAL HERNIA REPAIR Right 3618 mos old  . LAPAROSCOPIC CHOLECYSTECTOMY  09/1998  . SAVORY DILATION N/A 11/20/2014   Procedure: SAVORY DILATION;  Surgeon: Carman ChingJames Edwards, MD;  Location: [facility];  Service: Endoscopy;  Laterality: N/A;  . WISDOM TOOTH EXTRACTION  1968   Past Medical History:  Diagnosis Date  . Anxiety   . Arthritis   . Colitis   . Early age-related macular degeneration   . Hernia   . Hypertension   . Infertility, female   . Menopausal symptoms   . Rosacea   . Thyroid disease 1968   hypothyroidism treated with meds for short time then all labs normal   BP (!) 158/87   Pulse 81   Temp 97.7 F (36.5 C)   Ht 5' 5.5" (1.664 m)   Wt 152 lb (68.9 kg)   SpO2 95%   BMI 24.91 kg/m   Opioid Risk Score:   Fall Risk Score:  `1   Depression screen PHQ 2/9  [facility] 05/20/2018 04/19/2015  Decreased Interest 0 0  Down, Depressed, Hopeless 0 0  PHQ - 2 Score 0 0    Review of Systems  Constitutional: Negative.   HENT: Negative.   Eyes: Negative.   Respiratory: Negative.   Cardiovascular: Negative.   Gastrointestinal: Negative.   Endocrine: Negative.   Genitourinary: Negative.   Musculoskeletal: Negative.   Skin: Negative.   Allergic/Immunologic: Negative.   Neurological: Negative.   Hematological: Negative.   Psychiatric/Behavioral: Negative.   All other systems reviewed and are negative.      Objective:   Physical Exam General: No acute distress HEENT: EOMI, oral membranes moist Cards: reg rate  Chest: normal effort Abdomen: Soft, NT, ND Skin: dry, intact Extremities: no edema Neuro: Pt is cognitively appropriate with normal insight, memory, and awareness. Cranial nerves 2-12 are intact. l. Reflexes are 2-3+. . Fine motor coordination is intact. No tremors. Motor function is grossly 5/5 in UE. RLE: 4+/5 HF, KE, ADF/PF 4+/5. LLE: 4+ to 5/5 prox to distal. Decreased sensation from mid abdomen to right foot, and partially/patch to left foot. Ambulated with and without pronged cane today. Tends to strike with entire foot instead of heel especially when she walked without cane. Musculoskeletal: normal rom, no pain Psych:  pleasant         Assessment & Plan:  1. Thoracic Myelpathy: S/P T-1-T-2 Laminectomy Decompression of Spinal Cord Pedicle Screw:               -resume outpt therapies as a refresher to help guide home program. Aquatic therapy if possible              -reviewed mechanics and gait technique in office today.              -discussed recovery, prognosis again today 2. Hypertension: controlled  3. Neurogenic bladder/frequency:             -myrbretriq trial was discussed she would like to hold off 4. Neurogenic bowel:  -suggested regular intake and timing  -could  use digital stim with glove or toilet paper when trying to empty  -consider suppository.                15minutes of face to face patient care time was spent during this visit. All questions  were encouraged and answered. Follow up in 4 months.

## 2019-01-04 NOTE — Patient Instructions (Signed)
BOWEL PROGRAM:  CONSISTENT SOLID AND FLUID INTAKE (SAME KINDS OF FOOD) CONSISTENT TIMES THAT YOU EAT  USE DIGITAL STIM WITH GLOVE OR TOILET PAPER WHEN YOU SIT ON TOILET.

## 2019-05-10 ENCOUNTER — Ambulatory Visit: Payer: Medicare Other | Admitting: Physical Medicine & Rehabilitation

## 2019-05-10 ENCOUNTER — Encounter: Payer: Medicare PPO | Admitting: Physical Medicine & Rehabilitation

## 2019-06-04 ENCOUNTER — Ambulatory Visit: Payer: Medicare PPO | Attending: Internal Medicine

## 2019-06-04 DIAGNOSIS — Z23 Encounter for immunization: Secondary | ICD-10-CM | POA: Insufficient documentation

## 2019-06-04 NOTE — Progress Notes (Signed)
   Covid-19 Vaccination Clinic  Name:  Cheryl Pearson    MRN: [id number] DOB: [birth year]  06/04/2019  [name] was observed post Covid-19 immunization for 15 minutes without incidence. She was provided with Vaccine Information Sheet and instruction to access the V-Safe system.   [name] was instructed to call 911 with any severe reactions post vaccine: Marland Kitchen Difficulty breathing  . Swelling of your face and throat  . A fast heartbeat  . A bad rash all over your body  . Dizziness and weakness    Immunizations Administered    Name Date Dose VIS Date Route   Pfizer COVID-19 Vaccine 06/04/2019 11:34 AM 0.3 mL 04/07/2019 Intramuscular   Manufacturer: [employer], [employer]   Lot: [id number]   NDC: [address]-6

## 2019-06-22 ENCOUNTER — Ambulatory Visit: Payer: Medicare PPO

## 2019-06-26 ENCOUNTER — Ambulatory Visit: Payer: Self-pay

## 2019-06-28 ENCOUNTER — Ambulatory Visit: Payer: Medicare PPO

## 2019-06-28 ENCOUNTER — Ambulatory Visit: Payer: Medicare PPO | Attending: Internal Medicine

## 2019-06-28 DIAGNOSIS — Z23 Encounter for immunization: Secondary | ICD-10-CM

## 2019-06-28 NOTE — Progress Notes (Signed)
   Covid-19 Vaccination Clinic  Name:  Cheryl Pearson    MRN: [id number] DOB: 07/06/1940  06/28/2019  Cheryl Pearson was observed post Covid-19 immunization for 15 minutes without incident. She was provided with Vaccine Information Sheet and instruction to access the V-Safe system.   Cheryl Pearson was instructed to call 911 with any severe reactions post vaccine: . Difficulty breathing  . Swelling of face and throat  . A fast heartbeat  . A bad rash all over body  . Dizziness and weakness   Immunizations Administered    Name Date Dose VIS Date Route   Pfizer COVID-19 Vaccine 06/28/2019  9:09 AM 0.3 mL 04/07/2019 Intramuscular   Manufacturer: Pfizer, Inc   Lot: [id number]   NDC: [address]-2     

## 2019-06-28 NOTE — Progress Notes (Signed)
   Covid-19 Vaccination Clinic  Name:  Cheryl Pearson    MRN: [id number] DOB: 04-16-1941  06/28/2019  [name] was observed post Covid-19 immunization for 15 minutes without incident. She was provided with Vaccine Information Sheet and instruction to access the V-Safe system.   [name] was instructed to call 911 with any severe reactions post vaccine: Marland Kitchen Difficulty breathing  . Swelling of face and throat  . A fast heartbeat  . A bad rash all over body  . Dizziness and weakness   Immunizations Administered    Name Date Dose VIS Date Route   Pfizer COVID-19 Vaccine 06/28/2019  9:09 AM 0.3 mL 04/07/2019 Intramuscular   Manufacturer: [employer], [employer]   Lot: [id number]   NDC: [address]-5

## 2020-01-13 IMAGING — DX DG CHEST 2V
2 series · 2 of 2 positions shown · non-contrast
Comparison: 09/13/2015

CLINICAL DATA: Cough for several years

EXAM:
CHEST - 2 VIEW

[dg chest 2 view (1 of 2)]
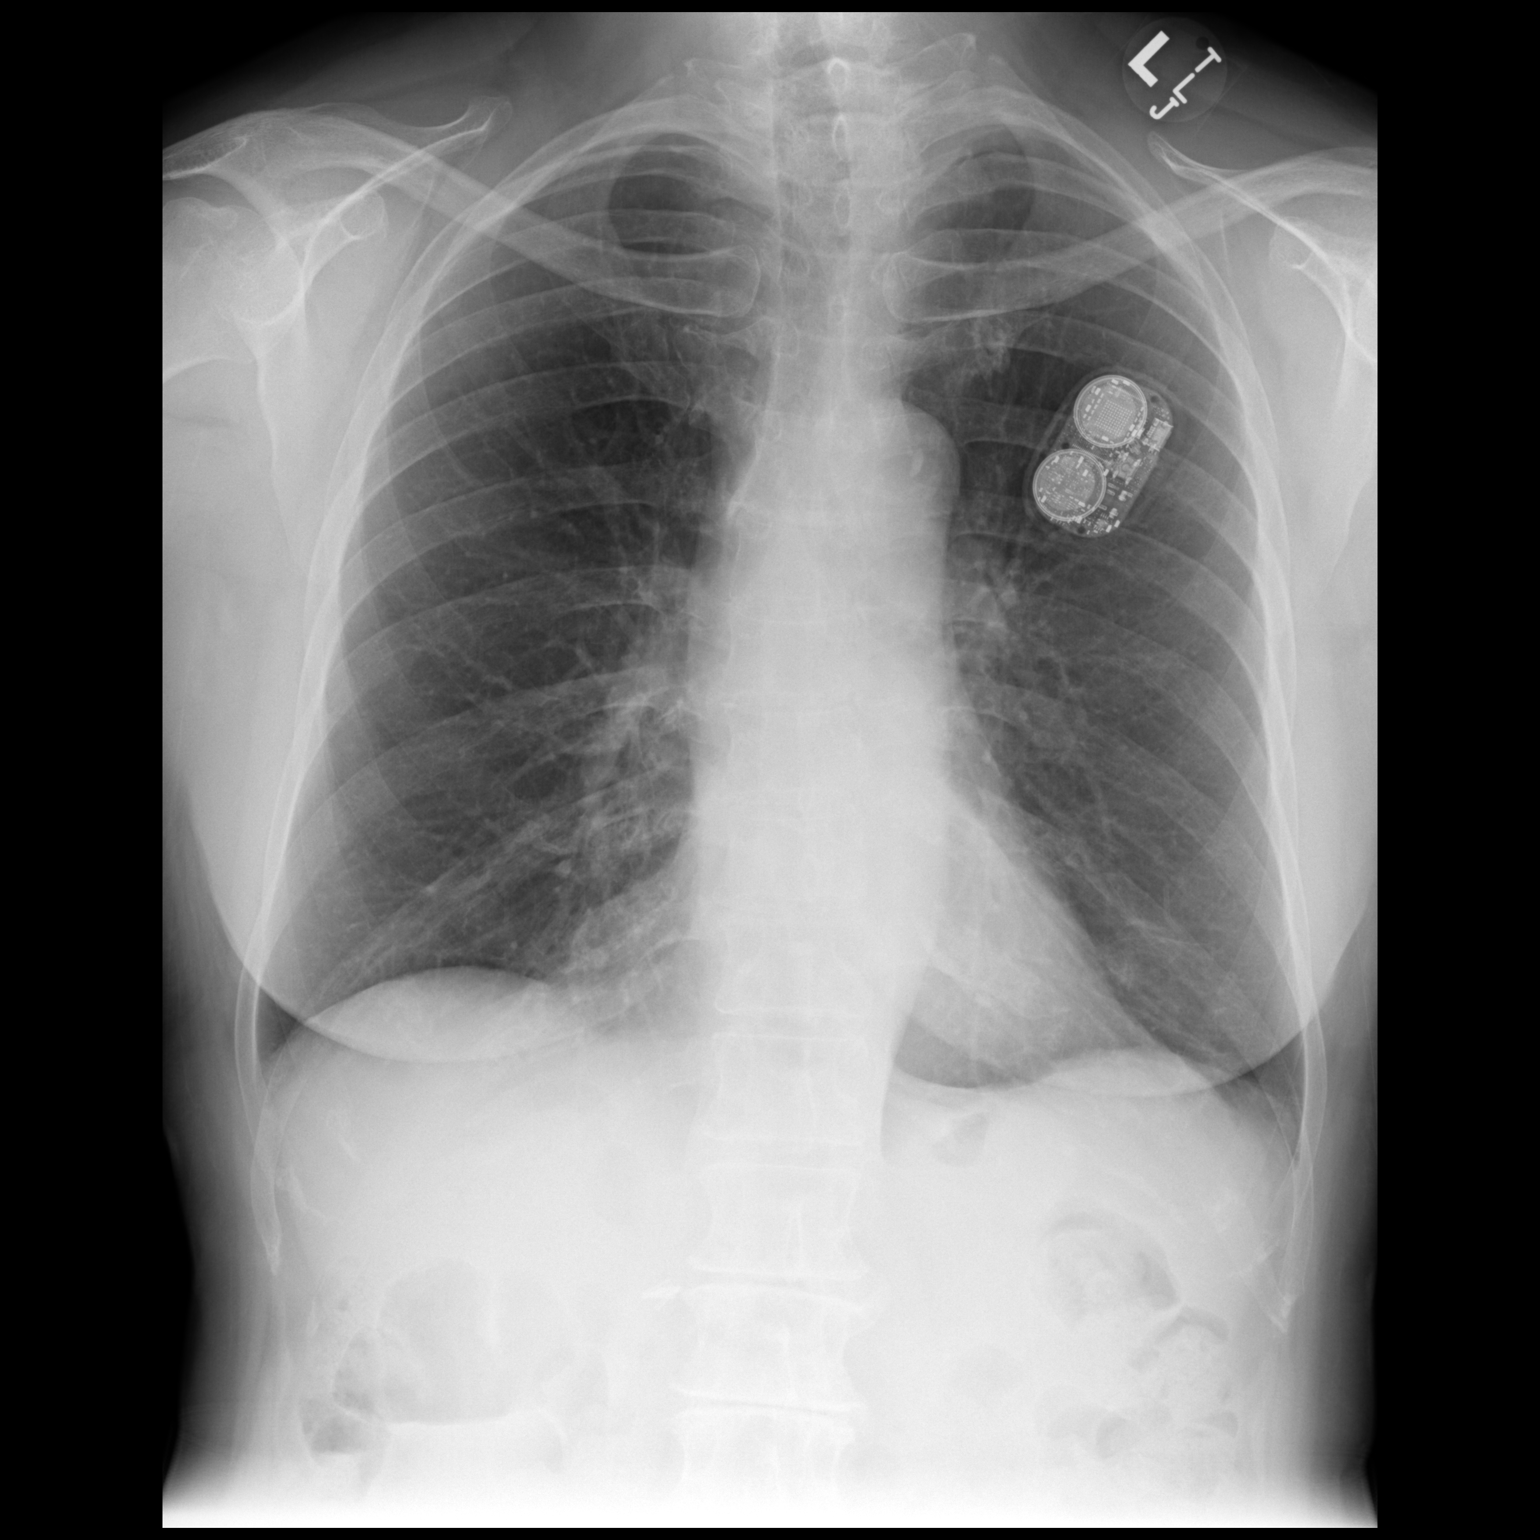

[dg chest 2 view (2 of 2)]
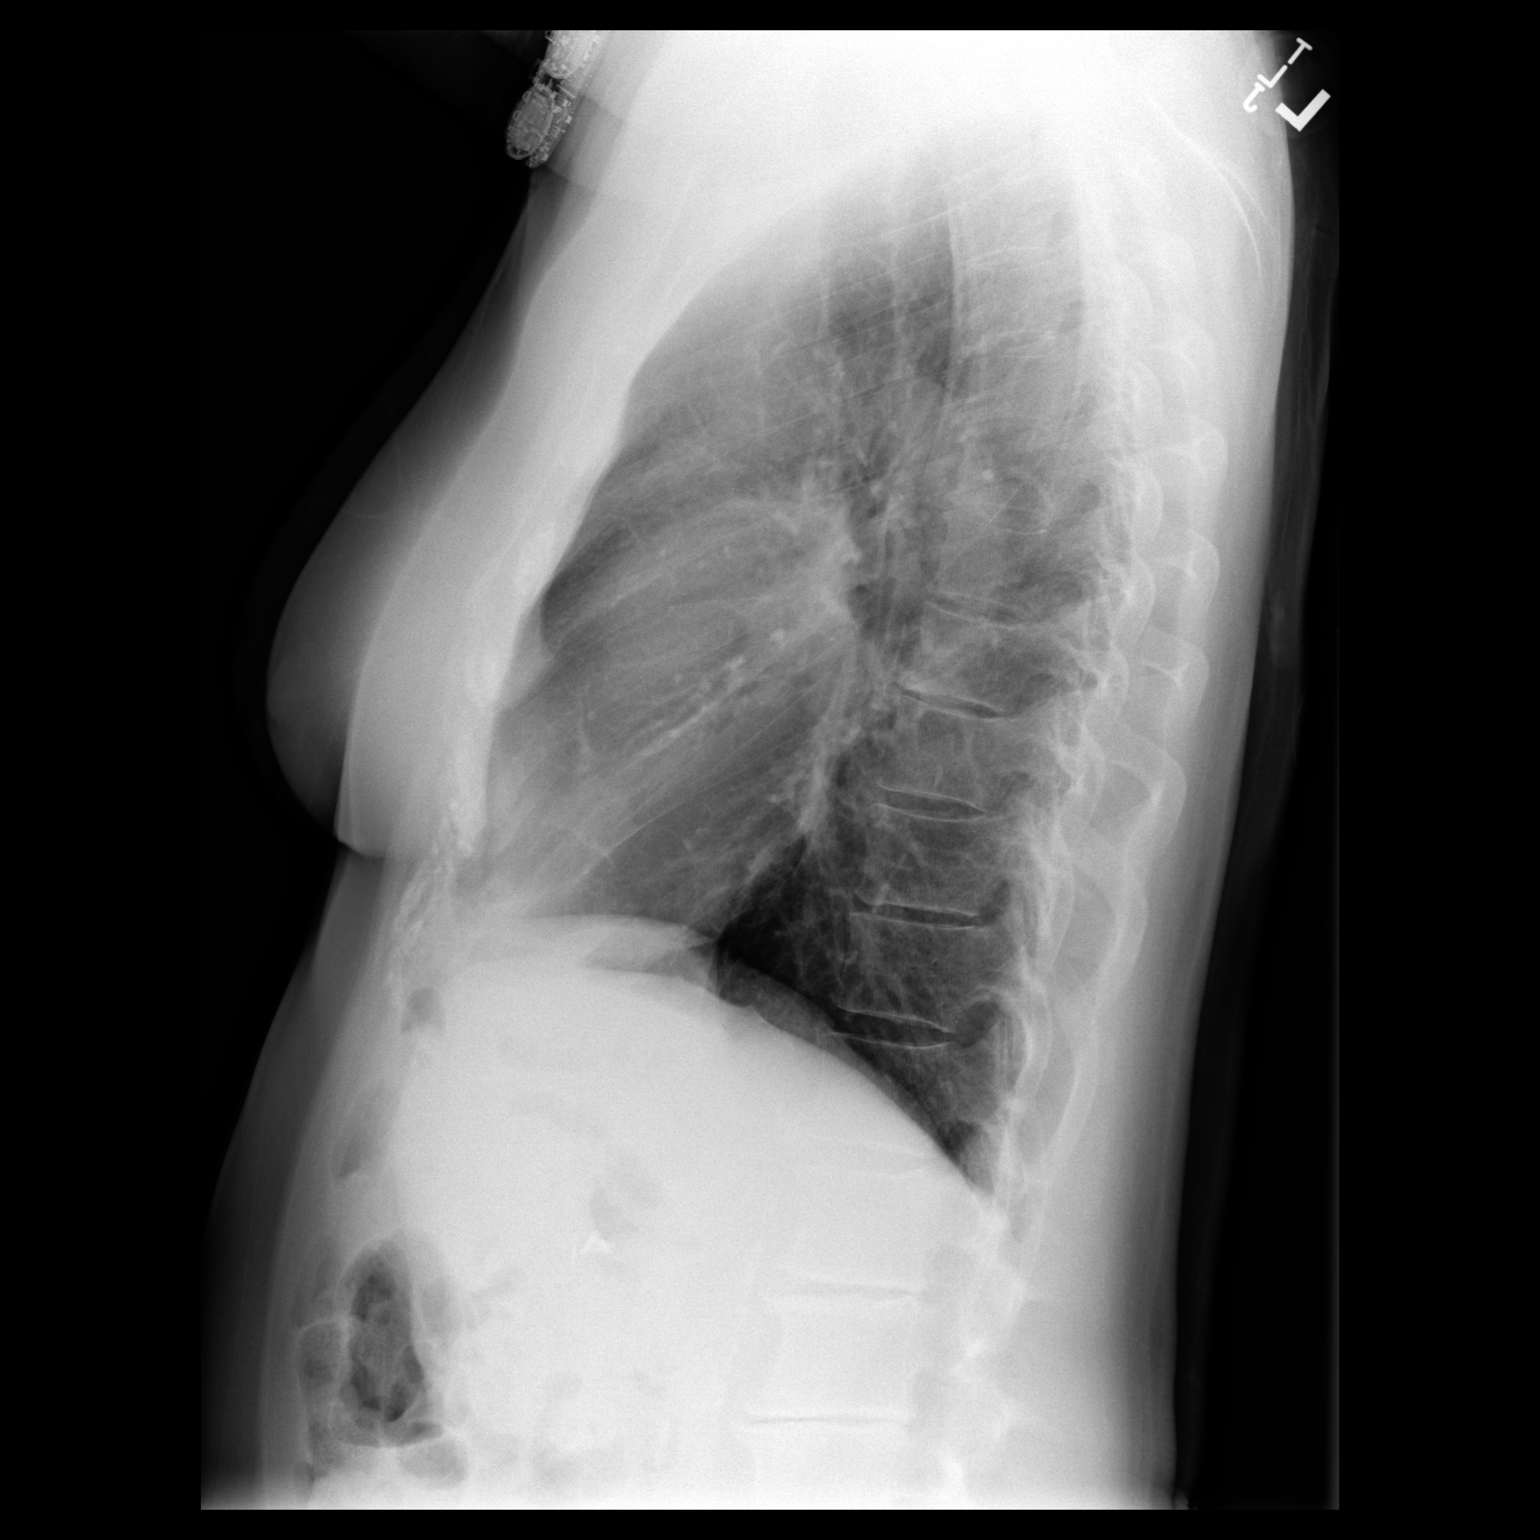

[2 of 2 positions shown; findings below may reference images not displayed]

FINDINGS: The heart size and mediastinal contours are within normal limits.
Both lungs are clear. The visualized skeletal structures are
unremarkable.
IMPRESSION: No active cardiopulmonary disease.

## 2020-01-29 ENCOUNTER — Other Ambulatory Visit: Payer: Self-pay | Admitting: Geriatric Medicine

## 2020-01-30 ENCOUNTER — Other Ambulatory Visit: Payer: Self-pay | Admitting: Geriatric Medicine

## 2020-01-30 DIAGNOSIS — M5442 Lumbago with sciatica, left side: Secondary | ICD-10-CM

## 2020-02-10 ENCOUNTER — Ambulatory Visit: Payer: Medicare PPO | Attending: Internal Medicine

## 2020-02-10 ENCOUNTER — Other Ambulatory Visit: Payer: Self-pay

## 2020-02-10 DIAGNOSIS — Z23 Encounter for immunization: Secondary | ICD-10-CM

## 2020-02-10 NOTE — Progress Notes (Signed)
   Covid-19 Vaccination Clinic  Name:  Cheryl Pearson    MRN: [id number] DOB: [birth year]  02/10/2020  Cheryl Pearson was observed post Covid-19 immunization for 15 minutes without incident. She was provided with Vaccine Information Sheet and instruction to access the V-Safe system.   Cheryl Pearson was instructed to call 911 with any severe reactions post vaccine: Marland Kitchen Difficulty breathing  . Swelling of face and throat  . A fast heartbeat  . A bad rash all over body  . Dizziness and weakness

## 2020-02-17 ENCOUNTER — Other Ambulatory Visit: Payer: Self-pay

## 2020-02-17 ENCOUNTER — Ambulatory Visit
Admission: RE | Admit: 2020-02-17 | Discharge: 2020-02-17 | Disposition: A | Discharge status: Home / Self Care | Payer: Medicare PPO | Source: Ambulatory Visit | Attending: Geriatric Medicine | Admitting: Geriatric Medicine

## 2020-02-17 DIAGNOSIS — M5442 Lumbago with sciatica, left side: Secondary | ICD-10-CM

## 2020-03-07 ENCOUNTER — Ambulatory Visit: Payer: Medicare PPO | Attending: Neurosurgery

## 2020-03-07 ENCOUNTER — Other Ambulatory Visit: Payer: Self-pay

## 2020-03-07 DIAGNOSIS — R2689 Other abnormalities of gait and mobility: Secondary | ICD-10-CM

## 2020-03-07 DIAGNOSIS — R2681 Unsteadiness on feet: Secondary | ICD-10-CM | POA: Diagnosis present

## 2020-03-07 DIAGNOSIS — M6281 Muscle weakness (generalized): Secondary | ICD-10-CM | POA: Insufficient documentation

## 2020-03-07 DIAGNOSIS — G8929 Other chronic pain: Secondary | ICD-10-CM | POA: Insufficient documentation

## 2020-03-07 DIAGNOSIS — M5441 Lumbago with sciatica, right side: Secondary | ICD-10-CM | POA: Diagnosis present

## 2020-03-07 DIAGNOSIS — M5442 Lumbago with sciatica, left side: Secondary | ICD-10-CM | POA: Insufficient documentation

## 2020-03-07 NOTE — Therapy (Signed)
[facility] [address] [place], [place], [address] Phone: [phone]   Fax:  [phone]  Physical Therapy Evaluation  Patient Details  Name: Cheryl Pearson MRN: [id number] Date of Birth: 09/27/1940 Referring Provider (PT): Dr. Lisbeth RenshawNeelesh Nundkumar   Encounter Date: 03/07/2020   PT End of Session - 03/07/20 1353    Visit Number 1    Number of Visits 17    Date for PT Re-Evaluation 06/05/20    Authorization Type [employer] medicare-awaiting auth    PT Start Time [phone]   pt late, as in restroom   PT Stop Time 1019    PT Time Calculation (min) 44 min    Equipment Utilized During Treatment Other (comment)   min guard to S prn   Activity Tolerance Patient tolerated treatment well    Behavior [facility] for tasks assessed/performed           Past Medical History:  Diagnosis Date   Anxiety    Arthritis    Colitis    Early age-related macular degeneration    Hernia    Hypertension    Infertility, female    Menopausal symptoms    Rosacea    Thyroid disease 1968   hypothyroidism treated with meds for short time then all labs normal    Past Surgical History:  Procedure Laterality Date   bone spur Left    COLONOSCOPY  11/2011   polyp, ulcerative coliits inactive   COLONOSCOPY  2005   COLONOSCOPY  1970's   diagnosed with ulcerative colitis   ESOPHAGOGASTRODUODENOSCOPY (EGD) WITH PROPOFOL N/A 11/20/2014   Procedure: ESOPHAGOGASTRODUODENOSCOPY (EGD) WITH PROPOFOL;  Surgeon: Carman ChingJames Edwards, MD;  Location: [facility];  Service: Endoscopy;  Laterality: N/A;   INGUINAL HERNIA REPAIR Right 9318 mos old   LAPAROSCOPIC CHOLECYSTECTOMY  09/1998   SAVORY DILATION N/A 11/20/2014   Procedure: SAVORY DILATION;  Surgeon: Carman ChingJames Edwards, MD;  Location: [facility];  Service: Endoscopy;  Laterality: N/A;   WISDOM TOOTH EXTRACTION  1968    There were no vitals filed for this visit.    Subjective Assessment  - 03/07/20 0939    Subjective Pt reported LBP and hip pain has been increasing over the last year and still has unsteadiness on feet from Tx myelopathy. Pt states she has sciatica down BLEs. She was up to walking one mile in neighborhood prior to pain starting. Per pt MRI showed "slipping" of L4-L5. Pt reports primary goal is to reduce LBP/hip pain. At worst pain is: 10/10-causes pt to wake at night (R hip), at best: 0/10 (sitting upright in one particular chair and lying down). Pt reports challenges walking over uneven surfaces, walking long distances, she notices imbalance. Pt is having a good day today, pain wise. No falls in last six months.    Patient is accompained by: Family member   husband: Raiford NobleRick   Pertinent History Tx myelopathy, HTN, macular degeneration, B cataract extraction (2021), hypothyroidism years ago, anxiety, arthritis.    How long can you stand comfortably? Pt has pain as soon as she wakes up.    How long can you walk comfortably? It depends, but pain is better in afternoon: less than five minutes    Patient Stated Goals Do decr. pain    Currently in Pain? No/denies    Pain Score 0-No pain              [facility] PT Assessment - 03/07/20 0953      Assessment  Medical Diagnosis Unsteadiness on feet    Referring Provider (PT) Dr. Lisbeth Renshaw    Onset Date/Surgical Date 07/06/19    Hand Dominance Right    Prior Therapy OPPT for Tx myelopathy      Precautions   Precautions Fall      Restrictions   Weight Bearing Restrictions No      Balance Screen   Has the patient fallen in the past 6 months No    Has the patient had a decrease in activity level because of a fear of falling?  Yes    Is the patient reluctant to leave their home because of a fear of falling?  No      Home Environment   Living Environment Private residence    Living Arrangements Spouse/significant other    Available Help at Discharge Family;Available 24 hours/day    Type of Home House    Home  Access Stairs to enter    Entrance Stairs-Number of Steps 2    Entrance Stairs-Rails Left;Right;Cannot reach both    Home Layout One level   4" step into [employer] - 2 wheels;Cane - single point;Shower seat;Toilet riser;Other (comment)   SPC with quad attachment, metal shelves to hold onto   Additional Comments uses SPC with quad attachment most of the time      Prior Function   Level of Independence Independent with household mobility with device;Independent with basic ADLs   does not drive   Vocation Retired    Leisure Walking, get together with friends      Cognition   Overall Cognitive Status Within Functional Limits for tasks assessed      Sensation   Light Touch Appears Intact    Additional Comments But pt reported intermittent N/T in L shoulder and BLEs      Coordination   Gross Motor Movements are Fluid and Coordinated Yes    Fine Motor Movements are Fluid and Coordinated No    Heel Shin Test Decr. range and time with RLE      Posture/Postural Control   Posture/Postural Control Postural limitations    Postural Limitations Increased lumbar lordosis      ROM / Strength   AROM / PROM / Strength AROM;Strength      AROM   Overall AROM  Deficits    Overall AROM Comments BUE: WFL. LLE: WFL. RLE WNL except, Decr. R hip flex and R dorsiflexion 2/2 weakness.      Strength   Overall Strength Deficits    Overall Strength Comments LLE: WFL (grossly 4/5 in all muscle groups), RLE: hip flex: 2+/5, knee ext: 3+/5, knee flex: 3+/5, ankle DF: 2/5. B seated hip abd/add: 3+/5.      Transfers   Transfers Sit to Stand;Stand to Sit    Sit to Stand 5: Supervision;Without upper extremity assist;From chair/3-in-1    Sit to Stand Details (indicate cue type and reason) S for safety.    Stand to Sit 5: Supervision;Without upper extremity assist;To chair/3-in-1      Ambulation/Gait   Ambulation/Gait Yes    Ambulation/Gait Assistance 5: Supervision    Ambulation/Gait  Assistance Details S for safety. Intermittent narrow BOS. SPC in hand.     Ambulation Distance (Feet) 100 Feet    Assistive device None    Gait Pattern Narrow base of support    Ambulation Surface Level;Indoor    Gait velocity 3.78ft/sec.     Gait Comments Pt held cane and reported today was  a really good day, as pain was minimal.                       Objective measurements completed on examination: See above findings.               PT Education - 03/07/20 1352    Education Details PT educated pt on exam findings, insurance auth process, and outcome measure results. PT educated on POC, frequency and duration.    Person(s) Educated Patient    Methods Explanation    Comprehension Verbalized understanding            PT Short Term Goals - 03/07/20 1652      PT SHORT TERM GOAL #1   Title Pt will be IND in HEP to improve strength, balance, and pain. TARGET DATE FOR ALL STGS: 04/04/20    Baseline No HEP    Time 4    Period Weeks    Status New      PT SHORT TERM GOAL #2   Title Pt will amb. 300', IND, over even indoor terrain to improve safety during amb. at home.    Time 4    Period Weeks    Status New      PT SHORT TERM GOAL #3   Title Perform FGA and write goals as indicated.    Time 4    Period Weeks    Status New      PT SHORT TERM GOAL #4   Title Pt will report back pain during ADLs', at worst,  is </= 7/10 to improve QOL and ability to perform ADLs safely.    Baseline 10/10 at worst    Time 4    Period Weeks    Status New             PT Long Term Goals - 03/07/20 1657      PT LONG TERM GOAL #1   Title Pt will amb. 500', IND, over even and paved outdoors surfaces in order to improve functional mobility.    Time 8    Period Weeks    Status New      PT LONG TERM GOAL #2   Title Pt will report LBP/hip pain at worst during ADLs is </=2/10 to improve QOL and perform ADLs safely.    Baseline 10/10    Time 8    Period Weeks    Status  New      PT LONG TERM GOAL #3   Title Pt will report ability to stand for 30+ minutes without incr. in LBP in order to perform ADLs and improve QOL.    Baseline 10/10 at worst    Time 8    Period Weeks    Status New                  Plan - 03/07/20 1354    Clinical Impression Statement Pt is a pleasant 79y/o female presenting to OPPT neuro 2/2 LBP and hip pain and unsteadiness on feet (2/2 hx of Thoracic myelopathy). Pt's PMH is significant for the following: Tx myelopathy, HTN, macular degeneration, B cataract extraction (2021), hypothyroidism years ago, anxiety, arthritis. Pt's gait speed was WNL, however, pt reported she is having a great day and pain is the limiting factor for imbalance and endurance when LBP incr. Today's session limited 2/2 time constraints, therefore, PT will formally assess pt's balance next session but impairments suspected based on muscle weakness and gait  deviations. The following impairments were noted: gait deviations, imbalance,decr. strength, decr. ROM, pain, and postural dysfunction. Pt would benefit from skilled PT to improve safety during functional mobility.    Personal Factors and Comorbidities Age;Comorbidity 3+;Time since onset of injury/illness/exacerbation;Past/Current Experience    Comorbidities Tx myelopathy, HTN, macular degeneration, B cataract extraction (2021), hypothyroidism years ago, anxiety, arthritis.    Examination-Activity Limitations Bend;Bathing;Locomotion Level;Transfers;Reach Overhead;Carry;Dressing;Stand;Stairs;Squat    Examination-Participation Restrictions Cleaning;Community Activity;Driving;Interpersonal Relationship;Laundry;Meal Prep    Stability/Clinical Decision Making Evolving/Moderate complexity    Clinical Decision Making Moderate    Rehab Potential Good    PT Frequency 2x / week    PT Duration 8 weeks    PT Treatment/Interventions ADLs/Self Care Home Management;Aquatic Therapy;Biofeedback;Electrical Stimulation;DME  Instruction;Balance training;Therapeutic exercise;Therapeutic activities;Patient/family education;Functional mobility training;Stair training;Gait training;Neuromuscular re-education;Manual techniques;Vestibular;Orthotic Fit/Training    PT Next Visit Plan Back exam, FGA testing, and write goals. HEP for LE strength and balance.    Consulted and Agree with Plan of Care Patient;Family member/caregiver    Family Member Consulted Rick: husband           Patient will benefit from skilled therapeutic intervention in order to improve the following deficits and impairments:  Abnormal gait, Decreased coordination, Decreased range of motion, Decreased endurance, Decreased knowledge of use of DME, Decreased balance, Decreased mobility, Decreased strength, Postural dysfunction, Impaired flexibility, Pain  Visit Diagnosis: Other abnormalities of gait and mobility - Plan: PT plan of care cert/re-cert  Muscle weakness (generalized) - Plan: PT plan of care cert/re-cert  Unsteadiness on feet - Plan: PT plan of care cert/re-cert  Chronic midline low back pain with bilateral sciatica - Plan: PT plan of care cert/re-cert     Problem List Patient Active Problem List   Diagnosis Date Noted   Neurogenic bladder 06/29/2018   Essential hypertension    Cervical spondylosis without myelopathy    Steroid-induced hyperglycemia    Postoperative pain    Hyponatremia    Leucocytosis    Thoracic myelopathy 04/22/2018   ALLERGIC RHINITIS 01/19/2007    Jamari Diana L 03/07/2020, 5:06 PM  [facility] [address] [place], [place], [address] Phone: [phone]   Fax:  [phone]  Name: Cheryl Pearson MRN: [id number] Date of Birth: 03-15-1941  Zerita Boers, PT,DPT 03/07/20 5:06 PM Phone: [phone] Fax: [phone]

## 2020-03-14 ENCOUNTER — Ambulatory Visit: Payer: Medicare PPO

## 2020-03-14 ENCOUNTER — Other Ambulatory Visit: Payer: Self-pay

## 2020-03-14 DIAGNOSIS — R2681 Unsteadiness on feet: Secondary | ICD-10-CM

## 2020-03-14 DIAGNOSIS — G8929 Other chronic pain: Secondary | ICD-10-CM

## 2020-03-14 DIAGNOSIS — R2689 Other abnormalities of gait and mobility: Secondary | ICD-10-CM | POA: Diagnosis not present

## 2020-03-14 DIAGNOSIS — M5442 Lumbago with sciatica, left side: Secondary | ICD-10-CM

## 2020-03-14 DIAGNOSIS — M6281 Muscle weakness (generalized): Secondary | ICD-10-CM

## 2020-03-14 NOTE — Patient Instructions (Signed)
SLEEPING POSITION:  BACK: with pillow placed under knees SIDE: sleep with body pillow placed between knees and rest upper arm on pillow  SPINAL STENOSIS: Think neutral spine-don't arch spine  Piriformis Stretch, Sitting    Sit, one ankle on opposite knee, same-side hand on crossed knee. Push down on knee, keeping spine straight. (PROGRESSION) Lean torso forward, with flat back, until tension is felt in hamstrings and gluteals of crossed-leg side. Hold _30__ seconds. Repeat on other side and hold for 30 seconds.  Repeat _3__ times per session. Do _3__ sessions per day. YOU CAN ALSO TRY THIS LYING DOWN.  Copyright  VHI. All rights reserved.    WALKING MORE.

## 2020-03-14 NOTE — Therapy (Signed)
[facility] [address] [place], [place], [address] Phone: [phone]   Fax:  [phone]  Physical Therapy Treatment  Patient Details  Name: Cheryl Pearson MRN: [id number] Date of Birth: 12-11-1940 Referring Provider (PT): Dr. Lisbeth Renshaw   Encounter Date: 03/14/2020   PT End of Session - 03/14/20 1548    Visit Number 2    Number of Visits 17    Date for PT Re-Evaluation 06/05/20    Authorization Type [employer] medicare-awaiting auth    PT Start Time 1316    PT Stop Time 1400    PT Time Calculation (min) 44 min    Equipment Utilized During Treatment Other (comment)    Activity Tolerance Patient tolerated treatment well    Behavior During Therapy [facility] for tasks assessed/performed           Past Medical History:  Diagnosis Date  . Anxiety   . Arthritis   . Colitis   . Early age-related macular degeneration   . Hernia   . Hypertension   . Infertility, female   . Menopausal symptoms   . Rosacea   . Thyroid disease 1968   hypothyroidism treated with meds for short time then all labs normal    Past Surgical History:  Procedure Laterality Date  . bone spur Left   . COLONOSCOPY  11/2011   polyp, ulcerative coliits inactive  . COLONOSCOPY  2005  . COLONOSCOPY  1970's   diagnosed with ulcerative colitis  . ESOPHAGOGASTRODUODENOSCOPY (EGD) WITH PROPOFOL N/A 11/20/2014   Procedure: ESOPHAGOGASTRODUODENOSCOPY (EGD) WITH PROPOFOL;  Surgeon: Carman Ching, MD;  Location: [facility];  Service: Endoscopy;  Laterality: N/A;  . INGUINAL HERNIA REPAIR Right 41 mos old  . LAPAROSCOPIC CHOLECYSTECTOMY  09/1998  . SAVORY DILATION N/A 11/20/2014   Procedure: SAVORY DILATION;  Surgeon: Carman Ching, MD;  Location: [facility];  Service: Endoscopy;  Laterality: N/A;  . WISDOM TOOTH EXTRACTION  1968    There were no vitals filed for this visit.   Subjective Assessment - 03/14/20 1319    Subjective Pt stated she  feels more "normal" as the anti-inflammatory has worn off. She felt really good last time 2/2 medication was still in her system. Pt denied falls since last visit.    Patient is accompained by: Family member    Pertinent History Tx myelopathy, HTN, macular degeneration, B cataract extraction (2021), hypothyroidism years ago, anxiety, arthritis.    Patient Stated Goals Do decr. pain    Currently in Pain? No/denies              [facility] PT Assessment - 03/14/20 1323      AROM   Overall AROM  Deficits    AROM Assessment Site Lumbar    Lumbar Flexion approx. 75%    Lumbar Extension limited 2/2 spinal stenosis dx   centralized pain but has stenosis   Lumbar - Right Side Bend WNL   with concordant LBP and B hip pain    Lumbar - Left Side Bend WNL   with concordant LBP and B hip pain   Lumbar - Right Rotation WNL   with concordant pain   Lumbar - Left Rotation WNL   with concordant pain     Functional Gait  Assessment   Gait assessed  --    Gait Level Surface Walks 20 ft, slow speed, abnormal gait pattern, evidence for imbalance or deviates 10-15 in outside of the 12 in walkway width. Requires more than 7  sec to ambulate 20 ft.   > 7sec/   Change in Gait Speed Makes only minor adjustments to walking speed, or accomplishes a change in speed with significant gait deviations, deviates 10-15 in outside the 12 in walkway width, or changes speed but loses balance but is able to recover and continue walking.   with cane   Gait with Horizontal Head Turns Performs head turns smoothly with slight change in gait velocity (eg, minor disruption to smooth gait path), deviates 6-10 in outside 12 in walkway width, or uses an assistive device.   with cane   Gait with Vertical Head Turns Performs task with slight change in gait velocity (eg, minor disruption to smooth gait path), deviates 6 - 10 in outside 12 in walkway width or uses assistive device    Gait and Pivot Turn Pivot turns safely in greater than 3 sec  and stops with no loss of balance, or pivot turns safely within 3 sec and stops with mild imbalance, requires small steps to catch balance.    Step Over Obstacle Is able to step over one shoe box (4.5 in total height) but must slow down and adjust steps to clear box safely. May require verbal cueing.    Gait with Narrow Base of Support Ambulates less than 4 steps heel to toe or cannot perform without assistance.    Gait with Eyes Closed Walks 20 ft, uses assistive device, slower speed, mild gait deviations, deviates 6-10 in outside 12 in walkway width. Ambulates 20 ft in less than 9 sec but greater than 7 sec.    Ambulating Backwards Walks 20 ft, slow speed, abnormal gait pattern, evidence for imbalance, deviates 10-15 in outside 12 in walkway width.    Steps Two feet to a stair, must use rail.   R hip pain to ascend, L knee pain to descend   Total Score 13    FGA comment: 13/30: high falls risk          Pt noted to amb. Today with L heel strike. Pt stated this is because she's fearful of losing balance.               [facility] Adult PT Treatment/Exercise - 03/14/20 1544      Self-Care   Self-Care Other Self-Care Comments    Other Self-Care Comments  PT educated pt on different sleeping positions (back and side) with body pillow to reduce pain. PT educated pt on incr. walking and to maintain neutral spine 2/2 spinal stenosis.       Exercises   Exercises Knee/Hip      Knee/Hip Exercises: Stretches   Piriformis Stretch Both;3 reps;30 seconds    Piriformis Stretch Limitations PT provided stretch as HEP and provided cues and demo for proper technique. Also, educated pt on how to modify stretch in supine position.                 Self care:  PT Education - 03/14/20 1547    Education Details PT educated pt on FGA results and falls risk. PT educated pt on the importance of sleeping on back or side vs. stomach 2/2 pain and spinal stenosis. PT also educated pt on the importance of  improving core and LE strength to decr. pain.    Person(s) Educated Patient;Spouse    Methods Explanation;Demonstration;Verbal cues;Handout    Comprehension Returned demonstration;Verbalized understanding            PT Short Term Goals - 03/14/20 1555  PT SHORT TERM GOAL #1   Title Pt will be IND in HEP to improve strength, balance, and pain. TARGET DATE FOR ALL STGS: 04/04/20    Baseline No HEP    Time 4    Period Weeks    Status New      PT SHORT TERM GOAL #2   Title Pt will amb. 300', IND, over even indoor terrain to improve safety during amb. at home.    Time 4    Period Weeks    Status New      PT SHORT TERM GOAL #3   Title Perform FGA and write goals as indicated.    Time 4    Period Weeks    Status Achieved      PT SHORT TERM GOAL #4   Title Pt will report back pain during ADLs', at worst,  is </= 7/10 to improve QOL and ability to perform ADLs safely.    Baseline 10/10 at worst    Time 4    Period Weeks    Status New      PT SHORT TERM GOAL #5   Title Pt will improve FGA score to >/=17/30 to decr. falls risk.    Baseline 13/30    Time 4    Period Weeks    Status New             PT Long Term Goals - 03/14/20 1556      PT LONG TERM GOAL #1   Title Pt will amb. 500', IND, over even and paved outdoors surfaces in order to improve functional mobility.    Time 8    Period Weeks    Status New      PT LONG TERM GOAL #2   Title Pt will report LBP/hip pain at worst during ADLs is </=2/10 to improve QOL and perform ADLs safely.    Baseline 10/10    Time 8    Period Weeks    Status New      PT LONG TERM GOAL #3   Title Pt will report ability to stand for 30+ minutes without incr. in LBP in order to perform ADLs and improve QOL.    Baseline 10/10 at worst    Time 8    Period Weeks    Status New      PT LONG TERM GOAL #4   Title Pt will improve FGA score to >/=21/30 to decr. falls risk.    Baseline 13/30    Time 8    Period Weeks    Status New                  Plan - 03/14/20 1551    Clinical Impression Statement Pt's FGA score indicated pt is at high risk for falls. Pt noted to experience incr. difficulty with L heel strike and more pronounced gait deviations today vs. evaluation and pt stated this is due to anti-inflammotory medication wearing off. Pt noted to have limited lumbar ROM and concordant pain (with BLE pain) provoked with forward bending, however, pt's imaging shows spinal stenosis therefore, pt needs to minimize Lx spine ext. PT will focus on balance, core/LE strengthening, stretching and strengthening to reduce pain. Continue with POC.    Personal Factors and Comorbidities Age;Comorbidity 3+;Time since onset of injury/illness/exacerbation;Past/Current Experience    Comorbidities Tx myelopathy, HTN, macular degeneration, B cataract extraction (2021), hypothyroidism years ago, anxiety, arthritis.    Examination-Activity Limitations Bend;Bathing;Locomotion Level;Transfers;Reach Overhead;Carry;Dressing;Stand;Stairs;Squat    Examination-Participation  Restrictions Cleaning;Community Activity;Driving;Interpersonal Relationship;Laundry;Meal Prep    Stability/Clinical Decision Making Evolving/Moderate complexity    Rehab Potential Good    PT Frequency 2x / week    PT Duration 8 weeks    PT Treatment/Interventions ADLs/Self Care Home Management;Aquatic Therapy;Biofeedback;Electrical Stimulation;DME Instruction;Balance training;Therapeutic exercise;Therapeutic activities;Patient/family education;Functional mobility training;Stair training;Gait training;Neuromuscular re-education;Manual techniques;Vestibular;Orthotic Fit/Training    PT Next Visit Plan HEP for core and LE strength, flexibility and balance. Check for leg length discrepency.    PT Home Exercise Plan piriformis stretch    Consulted and Agree with Plan of Care Patient;Family member/caregiver    Family Member Consulted Rick: husband           Patient will benefit  from skilled therapeutic intervention in order to improve the following deficits and impairments:  Abnormal gait, Decreased coordination, Decreased range of motion, Decreased endurance, Decreased knowledge of use of DME, Decreased balance, Decreased mobility, Decreased strength, Postural dysfunction, Impaired flexibility, Pain  Visit Diagnosis: Other abnormalities of gait and mobility  Chronic midline low back pain with bilateral sciatica  Muscle weakness (generalized)  Unsteadiness on feet     Problem List Patient Active Problem List   Diagnosis Date Noted  . Neurogenic bladder 06/29/2018  . Essential hypertension   . Cervical spondylosis without myelopathy   . Steroid-induced hyperglycemia   . Postoperative pain   . Hyponatremia   . Leucocytosis   . Thoracic myelopathy 04/22/2018  . ALLERGIC RHINITIS 01/19/2007    Cheryl Pearson L 03/14/2020, 4:02 PM  [facility] [address] [place], [place], [address] Phone: [phone]   Fax:  [phone]  Name: JODIE CAVEY MRN: [id number] Date of Birth: 1941-04-18  Zerita Boers, PT,DPT 03/14/20 4:03 PM Phone: [phone] Fax: [phone]

## 2020-03-18 ENCOUNTER — Other Ambulatory Visit: Payer: Self-pay

## 2020-03-18 ENCOUNTER — Ambulatory Visit: Payer: Medicare PPO

## 2020-03-18 DIAGNOSIS — R2689 Other abnormalities of gait and mobility: Secondary | ICD-10-CM | POA: Diagnosis not present

## 2020-03-18 DIAGNOSIS — M6281 Muscle weakness (generalized): Secondary | ICD-10-CM

## 2020-03-18 NOTE — Therapy (Signed)
[facility] [address] [place], [place], [address] Phone: [phone]   Fax:  [phone]  Physical Therapy Treatment  Patient Details  Name: Cheryl Pearson MRN: [id number] Date of Birth: 1940/07/22 Referring Provider (PT): Dr. Lisbeth Renshaw   Encounter Date: 03/18/2020   PT End of Session - 03/18/20 1450    Visit Number 3    Number of Visits 17    Date for PT Re-Evaluation 06/05/20    Authorization Type [employer] medicare-awaiting auth    PT Start Time 1448    PT Stop Time 1530    PT Time Calculation (min) 42 min    Equipment Utilized During Treatment Other (comment)    Activity Tolerance Patient tolerated treatment well    Behavior During Therapy [facility] for tasks assessed/performed           Past Medical History:  Diagnosis Date   Anxiety    Arthritis    Colitis    Early age-related macular degeneration    Hernia    Hypertension    Infertility, female    Menopausal symptoms    Rosacea    Thyroid disease 1968   hypothyroidism treated with meds for short time then all labs normal    Past Surgical History:  Procedure Laterality Date   bone spur Left    COLONOSCOPY  11/2011   polyp, ulcerative coliits inactive   COLONOSCOPY  2005   COLONOSCOPY  1970's   diagnosed with ulcerative colitis   ESOPHAGOGASTRODUODENOSCOPY (EGD) WITH PROPOFOL N/A 11/20/2014   Procedure: ESOPHAGOGASTRODUODENOSCOPY (EGD) WITH PROPOFOL;  Surgeon: Carman Ching, MD;  Location: [facility];  Service: Endoscopy;  Laterality: N/A;   INGUINAL HERNIA REPAIR Right 31 mos old   LAPAROSCOPIC CHOLECYSTECTOMY  09/1998   SAVORY DILATION N/A 11/20/2014   Procedure: SAVORY DILATION;  Surgeon: Carman Ching, MD;  Location: [facility];  Service: Endoscopy;  Laterality: N/A;   WISDOM TOOTH EXTRACTION  1968    There were no vitals filed for this visit.   Subjective Assessment - 03/18/20 1449    Subjective Pt reports that  she took Advil this morning and it really helped her back pain. She is not sure of the strength on it and will check before next time so we can add in.    Patient is accompained by: Family member    Pertinent History Tx myelopathy, HTN, macular degeneration, B cataract extraction (2021), hypothyroidism years ago, anxiety, arthritis.    Patient Stated Goals Do decr. pain    Currently in Pain? No/denies                             [facility] Adult PT Treatment/Exercise - 03/18/20 1453      Ambulation/Gait   Ambulation/Gait Yes    Ambulation/Gait Assistance 5: Supervision    Ambulation/Gait Assistance Details walked in/out of clinic with cane. Between activities without AD.    Assistive device Straight cane;None    Gait Pattern Step-through pattern;Decreased step length - right;Decreased step length - left      Therapeutic Activites    Therapeutic Activities Other Therapeutic Activities    Other Therapeutic Activities PT reviewed positioning to help with keeping back in neutral position to sleep with pillow between knees in sidelying or under knees in supine. Pt did get body pillow but finds really cumbersom in the bed.       Neuro Re-ed    Neuro Re-ed Details  Standing  at bottom of steps: alternating toe taps x 10 with 1 light fingertip support, staggered stance x 30 sec each position, standing feet together x 30 sec. Standing with lateral taps on bottom step with right leg x 10. CGA for safety with activities.       Exercises   Exercises Other Exercises    Other Exercises  Pt performed supine exercises for core stabilization in hooklying: transverse abdominus contraction (TA) education with PT palpating and then having pt palpate. Verbal cues to be sure to breath throughout and not hold breath. TA  with unilateral bent knee fall outs x 10, TA with marching x 10, TA with bridge x 10, TA with heel slides x 10. Verbal cues for form throughout.                  PT  Education - 03/18/20 1856    Education Details Started initial HEP.    Person(s) Educated Patient;Spouse    Methods Explanation;Demonstration;Handout    Comprehension Verbalized understanding            PT Short Term Goals - 03/14/20 1555      PT SHORT TERM GOAL #1   Title Pt will be IND in HEP to improve strength, balance, and pain. TARGET DATE FOR ALL STGS: 04/04/20    Baseline No HEP    Time 4    Period Weeks    Status New      PT SHORT TERM GOAL #2   Title Pt will amb. 300', IND, over even indoor terrain to improve safety during amb. at home.    Time 4    Period Weeks    Status New      PT SHORT TERM GOAL #3   Title Perform FGA and write goals as indicated.    Time 4    Period Weeks    Status Achieved      PT SHORT TERM GOAL #4   Title Pt will report back pain during ADLs', at worst,  is </= 7/10 to improve QOL and ability to perform ADLs safely.    Baseline 10/10 at worst    Time 4    Period Weeks    Status New      PT SHORT TERM GOAL #5   Title Pt will improve FGA score to >/=17/30 to decr. falls risk.    Baseline 13/30    Time 4    Period Weeks    Status New             PT Long Term Goals - 03/14/20 1556      PT LONG TERM GOAL #1   Title Pt will amb. 500', IND, over even and paved outdoors surfaces in order to improve functional mobility.    Time 8    Period Weeks    Status New      PT LONG TERM GOAL #2   Title Pt will report LBP/hip pain at worst during ADLs is </=2/10 to improve QOL and perform ADLs safely.    Baseline 10/10    Time 8    Period Weeks    Status New      PT LONG TERM GOAL #3   Title Pt will report ability to stand for 30+ minutes without incr. in LBP in order to perform ADLs and improve QOL.    Baseline 10/10 at worst    Time 8    Period Weeks    Status New  PT LONG TERM GOAL #4   Title Pt will improve FGA score to >/=21/30 to decr. falls risk.    Baseline 13/30    Time 8    Period Weeks    Status New                  Plan - 03/18/20 1857    Clinical Impression Statement Session focused on initiating core stabilization HEP. Pt denied any pain during session. Did well with exercises after cuing.    Personal Factors and Comorbidities Age;Comorbidity 3+;Time since onset of injury/illness/exacerbation;Past/Current Experience    Comorbidities Tx myelopathy, HTN, macular degeneration, B cataract extraction (2021), hypothyroidism years ago, anxiety, arthritis.    Examination-Activity Limitations Bend;Bathing;Locomotion Level;Transfers;Reach Overhead;Carry;Dressing;Stand;Stairs;Squat    Examination-Participation Restrictions Cleaning;Community Activity;Driving;Interpersonal Relationship;Laundry;Meal Prep    Stability/Clinical Decision Making Evolving/Moderate complexity    Rehab Potential Good    PT Frequency 2x / week    PT Duration 8 weeks    PT Treatment/Interventions ADLs/Self Care Home Management;Aquatic Therapy;Biofeedback;Electrical Stimulation;DME Instruction;Balance training;Therapeutic exercise;Therapeutic activities;Patient/family education;Functional mobility training;Stair training;Gait training;Neuromuscular re-education;Manual techniques;Vestibular;Orthotic Fit/Training    PT Next Visit Plan Continue to progress HEP with core stability, flexibility and balance. How is initial HEP going? Continue with balance training. Check for leg length discrepency.    PT Home Exercise Plan piriformis stretch    Consulted and Agree with Plan of Care Patient;Family member/caregiver    Family Member Consulted Rick: husband           Patient will benefit from skilled therapeutic intervention in order to improve the following deficits and impairments:  Abnormal gait, Decreased coordination, Decreased range of motion, Decreased endurance, Decreased knowledge of use of DME, Decreased balance, Decreased mobility, Decreased strength, Postural dysfunction, Impaired flexibility, Pain  Visit Diagnosis: Other  abnormalities of gait and mobility  Muscle weakness (generalized)     Problem List Patient Active Problem List   Diagnosis Date Noted   Neurogenic bladder 06/29/2018   Essential hypertension    Cervical spondylosis without myelopathy    Steroid-induced hyperglycemia    Postoperative pain    Hyponatremia    Leucocytosis    Thoracic myelopathy 04/22/2018   ALLERGIC RHINITIS 01/19/2007    Ronn Melena, PT, DPT, NCS 03/18/2020, 7:00 PM  [facility] [address] [place], [place], [address] Phone: [phone]   Fax:  [phone]  Name: Cheryl Pearson MRN: [id number] Date of Birth: 1941-01-15

## 2020-03-18 NOTE — Patient Instructions (Signed)
Access Code: [email or web address] URL: [email or web address] Date: 03/18/2020 Prepared by: Elmer Bales  Exercises Supine Transversus Abdominis Bracing - Hands on Stomach - 3 x daily - 7 x weekly - 2 sets - 10 reps - 30 sec hold Supine March - 2 x daily - 5 x weekly - 1 sets - 10 reps Supine Transversus Abdominis Bracing with Leg Extension - 2 x daily - 5 x weekly - 1 sets - 10 reps Supine Transversus Abdominis Bracing with Double Leg Fallout - 2 x daily - 5 x weekly - 1 sets - 10 reps Beginner Bridge - 2 x daily - 5 x weekly - 1 sets - 10 reps

## 2020-03-25 ENCOUNTER — Other Ambulatory Visit: Payer: Self-pay

## 2020-03-25 ENCOUNTER — Encounter: Payer: Self-pay | Admitting: Rehabilitation

## 2020-03-25 ENCOUNTER — Ambulatory Visit: Payer: Medicare PPO | Admitting: Rehabilitation

## 2020-03-25 DIAGNOSIS — R2689 Other abnormalities of gait and mobility: Secondary | ICD-10-CM

## 2020-03-25 DIAGNOSIS — M6281 Muscle weakness (generalized): Secondary | ICD-10-CM

## 2020-03-25 DIAGNOSIS — M5441 Lumbago with sciatica, right side: Secondary | ICD-10-CM

## 2020-03-25 NOTE — Patient Instructions (Signed)
Access Code: [email or web address] URL: [email or web address] Date: 03/25/2020 Prepared by: Harriet Butte  Exercises Supine Transversus Abdominis Bracing - Hands on Stomach - 3 x daily - 7 x weekly - 2 sets - 10 reps - 30 sec hold Supine March - 2 x daily - 5 x weekly - 1 sets - 10 reps Supine Transversus Abdominis Bracing with Leg Extension - 2 x daily - 5 x weekly - 1 sets - 10 reps Supine Transversus Abdominis Bracing with Double Leg Fallout - 2 x daily - 5 x weekly - 1 sets - 10 reps Beginner Bridge - 2 x daily - 5 x weekly - 1 sets - 10 reps Seated Table Hamstring Stretch - 1 x daily - 7 x weekly - 1 sets - 2-3 reps - 30-60 hold

## 2020-03-25 NOTE — Therapy (Signed)
[facility] [address] [place], [place], [address] Phone: [phone]   Fax:  [phone]  Physical Therapy Treatment  Patient Details  Name: Cheryl Pearson MRN: [id number] Date of Birth: 1941/01/01 Referring Provider (PT): Dr. Lisbeth Renshaw   Encounter Date: 03/25/2020   PT End of Session - 03/25/20 1125    Visit Number 4    Number of Visits 17    Date for PT Re-Evaluation 06/05/20    Authorization Type [employer] medicare-awaiting auth    PT Start Time 0845    PT Stop Time 0930    PT Time Calculation (min) 45 min    Equipment Utilized During Treatment Other (comment)    Activity Tolerance Patient tolerated treatment well    Behavior During Therapy [facility] for tasks assessed/performed           Past Medical History:  Diagnosis Date  . Anxiety   . Arthritis   . Colitis   . Early age-related macular degeneration   . Hernia   . Hypertension   . Infertility, female   . Menopausal symptoms   . Rosacea   . Thyroid disease 1968   hypothyroidism treated with meds for short time then all labs normal    Past Surgical History:  Procedure Laterality Date  . bone spur Left   . COLONOSCOPY  11/2011   polyp, ulcerative coliits inactive  . COLONOSCOPY  2005  . COLONOSCOPY  1970's   diagnosed with ulcerative colitis  . ESOPHAGOGASTRODUODENOSCOPY (EGD) WITH PROPOFOL N/A 11/20/2014   Procedure: ESOPHAGOGASTRODUODENOSCOPY (EGD) WITH PROPOFOL;  Surgeon: Carman Ching, MD;  Location: [facility];  Service: Endoscopy;  Laterality: N/A;  . INGUINAL HERNIA REPAIR Right 83 mos old  . LAPAROSCOPIC CHOLECYSTECTOMY  09/1998  . SAVORY DILATION N/A 11/20/2014   Procedure: SAVORY DILATION;  Surgeon: Carman Ching, MD;  Location: [facility];  Service: Endoscopy;  Laterality: N/A;  . WISDOM TOOTH EXTRACTION  1968    There were no vitals filed for this visit.   Subjective Assessment - 03/25/20 0845    Subjective Pt reports she  took Advil this morning and has no back pain at all.  Reports sleeping is better, just has more pain when getting out of bed in the morning.    Pertinent History Tx myelopathy, HTN, macular degeneration, B cataract extraction (2021), hypothyroidism years ago, anxiety, arthritis.    How long can you stand comfortably? Pt has pain as soon as she wakes up.    How long can you walk comfortably? It depends, but pain is better in afternoon: less than five minutes    Patient Stated Goals Do decr. pain    Currently in Pain? No/denies                             [facility] Adult PT Treatment/Exercise - 03/25/20 0855      Self-Care   Self-Care Other Self-Care Comments    Other Self-Care Comments  She does report she is using body pillow that helps some.  Also she reports she may have exercise ball at home and if she finds may want exercises on this.       Therapeutic Activites    Therapeutic Activities Other Therapeutic Activities    Other Therapeutic Activities Pt reports wanting to get into floor to do exercises as bed is so soft.  Reviewed and educated on safe manner of getting on/off floor using stand/tall kneel/quadruped/supine and  vice versa.  Pt demos that she wants to stand up in an almost squat position but due to current LE weakness would recommend she do with sturdy furniture nearby for support.        Exercises   Exercises Other Exercises    Other Exercises  Reviewed HEP from previous session.  Did slightly progress hooklying marching so that B feet are off surface briefly to increase abdominal activation.  Pt did well with update.  Also added seated hamstring stretch, see pt instructions for full details.  Only did one rep of 10 for all exercises except stretches.  Continued to focus on core activation with seated exercises on blue physioball.  Worked on transitions from ant to post pelvic tilt x 10reps, seated heel taps (raising heels but leaving toes on ground) alternating x 10  reps working up to [employer] marches x 10 reps on each side.  Pt needs min/guard A with slight stabilization at ball at times as she has a tendency to lean trunk posteriorly.  Provided intermittent tactile cues to correct.  Performed LAQs (not full range) x 10 reps with light UE support as needed for improved quality.  Also performed B UE flex/ext with elows extended holding 2.2 lb medicine ball x 10reps followed by diagonal movement x 10 reps each direction.  Pt tolerated well.               Access Code: [email or web address] URL: [email or web address] Date: 03/25/2020 Prepared by: Harriet ButteEmily Claudean Leavelle  Exercises Supine Transversus Abdominis Bracing - Hands on Stomach - 3 x daily - 7 x weekly - 2 sets - 10 reps - 30 sec hold Supine March - 2 x daily - 5 x weekly - 1 sets - 10 reps Supine Transversus Abdominis Bracing with Leg Extension - 2 x daily - 5 x weekly - 1 sets - 10 reps Supine Transversus Abdominis Bracing with Double Leg Fallout - 2 x daily - 5 x weekly - 1 sets - 10 reps Beginner Bridge - 2 x daily - 5 x weekly - 1 sets - 10 reps Seated Table Hamstring Stretch - 1 x daily - 7 x weekly - 1 sets - 2-3 reps - 30-60 hold      PT Education - 03/25/20 1125    Education Details added stretching to HEP    Person(s) Educated Patient;Spouse    Methods Explanation;Demonstration;Handout    Comprehension Verbalized understanding;Returned demonstration            PT Short Term Goals - 03/14/20 1555      PT SHORT TERM GOAL #1   Title Pt will be IND in HEP to improve strength, balance, and pain. TARGET DATE FOR ALL STGS: 04/04/20    Baseline No HEP    Time 4    Period Weeks    Status New      PT SHORT TERM GOAL #2   Title Pt will amb. 300', IND, over even indoor terrain to improve safety during amb. at home.    Time 4    Period Weeks    Status New      PT SHORT TERM GOAL #3   Title Perform FGA and write goals as indicated.    Time 4    Period Weeks    Status Achieved      PT  SHORT TERM GOAL #4   Title Pt will report back pain during ADLs', at worst,  is </= 7/10 to improve QOL and ability to perform ADLs  safely.    Baseline 10/10 at worst    Time 4    Period Weeks    Status New      PT SHORT TERM GOAL #5   Title Pt will improve FGA score to >/=17/30 to decr. falls risk.    Baseline 13/30    Time 4    Period Weeks    Status New             PT Long Term Goals - 03/14/20 1556      PT LONG TERM GOAL #1   Title Pt will amb. 500', IND, over even and paved outdoors surfaces in order to improve functional mobility.    Time 8    Period Weeks    Status New      PT LONG TERM GOAL #2   Title Pt will report LBP/hip pain at worst during ADLs is </=2/10 to improve QOL and perform ADLs safely.    Baseline 10/10    Time 8    Period Weeks    Status New      PT LONG TERM GOAL #3   Title Pt will report ability to stand for 30+ minutes without incr. in LBP in order to perform ADLs and improve QOL.    Baseline 10/10 at worst    Time 8    Period Weeks    Status New      PT LONG TERM GOAL #4   Title Pt will improve FGA score to >/=21/30 to decr. falls risk.    Baseline 13/30    Time 8    Period Weeks    Status New                 Plan - 03/25/20 1125    Clinical Impression Statement Session reviewed initial HEP which is going well.  Did progress one exercise for more core activation and added hamstring stretch.  Remainder of session continued to focus on core activation with seated physioball exercises.  Pt definitely continues to have weak core, but demos improvement in session.    Personal Factors and Comorbidities Age;Comorbidity 3+;Time since onset of injury/illness/exacerbation;Past/Current Experience    Comorbidities Tx myelopathy, HTN, macular degeneration, B cataract extraction (2021), hypothyroidism years ago, anxiety, arthritis.    Examination-Activity Limitations Bend;Bathing;Locomotion Level;Transfers;Reach  Overhead;Carry;Dressing;Stand;Stairs;Squat    Examination-Participation Restrictions Cleaning;Community Activity;Driving;Interpersonal Relationship;Laundry;Meal Prep    Stability/Clinical Decision Making Evolving/Moderate complexity    Rehab Potential Good    PT Frequency 2x / week    PT Duration 8 weeks    PT Treatment/Interventions ADLs/Self Care Home Management;Aquatic Therapy;Biofeedback;Electrical Stimulation;DME Instruction;Balance training;Therapeutic exercise;Therapeutic activities;Patient/family education;Functional mobility training;Stair training;Gait training;Neuromuscular re-education;Manual techniques;Vestibular;Orthotic Fit/Training    PT Next Visit Plan Exercises still going ok?  Continue with balance training and add this to HEp as able. Check for leg length discrepency (I mentioned this to her and she said she did not think it was affecting her so I didn't do)    PT Home Exercise Plan piriformis stretch    Consulted and Agree with Plan of Care Patient;Family member/caregiver    Family Member Consulted Rick: husband           Patient will benefit from skilled therapeutic intervention in order to improve the following deficits and impairments:  Abnormal gait, Decreased coordination, Decreased range of motion, Decreased endurance, Decreased knowledge of use of DME, Decreased balance, Decreased mobility, Decreased strength, Postural dysfunction, Impaired flexibility, Pain  Visit Diagnosis: Other abnormalities of gait and mobility  Muscle  weakness (generalized)  Chronic midline low back pain with bilateral sciatica     Problem List Patient Active Problem List   Diagnosis Date Noted  . Neurogenic bladder 06/29/2018  . Essential hypertension   . Cervical spondylosis without myelopathy   . Steroid-induced hyperglycemia   . Postoperative pain   . Hyponatremia   . Leucocytosis   . Thoracic myelopathy 04/22/2018  . ALLERGIC RHINITIS 01/19/2007    Harriet Butte, PT,  MPT [facility] [address] [place], [place], [address] Phone: [phone]   Fax:  [phone] 03/25/20, 11:29 AM  Name: Cheryl Pearson MRN: [id number] Date of Birth: 30-Apr-1940

## 2020-03-28 ENCOUNTER — Ambulatory Visit: Payer: Medicare PPO | Attending: Neurosurgery

## 2020-03-28 ENCOUNTER — Other Ambulatory Visit: Payer: Self-pay

## 2020-03-28 DIAGNOSIS — M5441 Lumbago with sciatica, right side: Secondary | ICD-10-CM | POA: Diagnosis present

## 2020-03-28 DIAGNOSIS — G8929 Other chronic pain: Secondary | ICD-10-CM | POA: Diagnosis present

## 2020-03-28 DIAGNOSIS — M5442 Lumbago with sciatica, left side: Secondary | ICD-10-CM | POA: Insufficient documentation

## 2020-03-28 DIAGNOSIS — M6281 Muscle weakness (generalized): Secondary | ICD-10-CM | POA: Insufficient documentation

## 2020-03-28 DIAGNOSIS — R2689 Other abnormalities of gait and mobility: Secondary | ICD-10-CM | POA: Diagnosis not present

## 2020-03-28 DIAGNOSIS — R2681 Unsteadiness on feet: Secondary | ICD-10-CM | POA: Diagnosis present

## 2020-03-28 NOTE — Therapy (Signed)
[facility] [address] [place], [place], [address] Phone: [phone]   Fax:  [phone]  Physical Therapy Treatment  Patient Details  Name: Cheryl Pearson MRN: [id number] Date of Birth: 05/02/1940 Referring Provider (PT): Dr. Lisbeth Renshaw   Encounter Date: 03/28/2020   PT End of Session - 03/28/20 1547    Visit Number 5    Number of Visits 17    Date for PT Re-Evaluation 06/05/20    Authorization Type [employer] medicare: Berkley Harvey 03/07/20-06/05/20    PT Start Time 1315    PT Stop Time 1357    PT Time Calculation (min) 42 min    Equipment Utilized During Treatment Gait belt    Activity Tolerance Patient tolerated treatment well    Behavior During Therapy [facility] for tasks assessed/performed           Past Medical History:  Diagnosis Date  . Anxiety   . Arthritis   . Colitis   . Early age-related macular degeneration   . Hernia   . Hypertension   . Infertility, female   . Menopausal symptoms   . Rosacea   . Thyroid disease 1968   hypothyroidism treated with meds for short time then all labs normal    Past Surgical History:  Procedure Laterality Date  . bone spur Left   . COLONOSCOPY  11/2011   polyp, ulcerative coliits inactive  . COLONOSCOPY  2005  . COLONOSCOPY  1970's   diagnosed with ulcerative colitis  . ESOPHAGOGASTRODUODENOSCOPY (EGD) WITH PROPOFOL N/A 11/20/2014   Procedure: ESOPHAGOGASTRODUODENOSCOPY (EGD) WITH PROPOFOL;  Surgeon: Carman Ching, MD;  Location: [facility];  Service: Endoscopy;  Laterality: N/A;  . INGUINAL HERNIA REPAIR Right 38 mos old  . LAPAROSCOPIC CHOLECYSTECTOMY  09/1998  . SAVORY DILATION N/A 11/20/2014   Procedure: SAVORY DILATION;  Surgeon: Carman Ching, MD;  Location: [facility];  Service: Endoscopy;  Laterality: N/A;  . WISDOM TOOTH EXTRACTION  1968    There were no vitals filed for this visit.   Subjective Assessment - 03/28/20 1318    Subjective Pt reported  she's still sore from PT session on Monday, and she wasn't able to perform therex 2/2 soreness but has been walking with husband 20-25 minutes. Pt noted to amb. into gym with improvement in gait deviations today.    Patient is accompained by: Family member    Pertinent History Tx myelopathy, HTN, macular degeneration, B cataract extraction (2021), hypothyroidism years ago, anxiety, arthritis.    Patient Stated Goals To decr. pain    Currently in Pain? No/denies   but had some muscle soreness prior to taking Advil.                            [facility] Adult PT Treatment/Exercise - 03/28/20 1542      High Level Balance   High Level Balance Activities Other (comment);Head turns   forward amb., head nods   High Level Balance Comments In //bars over red and blue mats with min guard to S: pt performed 4-6x/activity over mats with cues and demo for technique. Non-compliant surfaces pt performed SLS activities with min A to min guard for safety: cone taps, single, double, knock over/turn upright 4x5 cones/LE/activity.                Balance Exercises - 03/28/20 1545      Balance Exercises: Seated   Other Seated Exercises Comments Pt performed in physio  ball to improve core activation, balance and strength: alternating marches, lateral pelvic tilts, B shoulder flexion-alternating, marches with contralateral shoulder flexion x10/activity. Performed with min guard to S and cues for proper technique.               PT Short Term Goals - 03/14/20 1555      PT SHORT TERM GOAL #1   Title Pt will be IND in HEP to improve strength, balance, and pain. TARGET DATE FOR ALL STGS: 04/04/20    Baseline No HEP    Time 4    Period Weeks    Status New      PT SHORT TERM GOAL #2   Title Pt will amb. 300', IND, over even indoor terrain to improve safety during amb. at home.    Time 4    Period Weeks    Status New      PT SHORT TERM GOAL #3   Title Perform FGA and write goals as  indicated.    Time 4    Period Weeks    Status Achieved      PT SHORT TERM GOAL #4   Title Pt will report back pain during ADLs', at worst,  is </= 7/10 to improve QOL and ability to perform ADLs safely.    Baseline 10/10 at worst    Time 4    Period Weeks    Status New      PT SHORT TERM GOAL #5   Title Pt will improve FGA score to >/=17/30 to decr. falls risk.    Baseline 13/30    Time 4    Period Weeks    Status New             PT Long Term Goals - 03/14/20 1556      PT LONG TERM GOAL #1   Title Pt will amb. 500', IND, over even and paved outdoors surfaces in order to improve functional mobility.    Time 8    Period Weeks    Status New      PT LONG TERM GOAL #2   Title Pt will report LBP/hip pain at worst during ADLs is </=2/10 to improve QOL and perform ADLs safely.    Baseline 10/10    Time 8    Period Weeks    Status New      PT LONG TERM GOAL #3   Title Pt will report ability to stand for 30+ minutes without incr. in LBP in order to perform ADLs and improve QOL.    Baseline 10/10 at worst    Time 8    Period Weeks    Status New      PT LONG TERM GOAL #4   Title Pt will improve FGA score to >/=21/30 to decr. falls risk.    Baseline 13/30    Time 8    Period Weeks    Status New                 Plan - 03/28/20 1548    Clinical Impression Statement Pt demonstrated progress, as she was able to tolerate high level balance activities with only one seated rest break and no incr. in pain. Pt continues to require min A to S for safety during high level balance activities, especially those which require incr. vestibular input, such as eyes closed. Pt would continue benefit from skilled PT to improve safety during functional mobility.    Personal Factors and Comorbidities Age;Comorbidity 3+;Time  since onset of injury/illness/exacerbation;Past/Current Experience    Comorbidities Tx myelopathy, HTN, macular degeneration, B cataract extraction (2021),  hypothyroidism years ago, anxiety, arthritis.    Examination-Activity Limitations Bend;Bathing;Locomotion Level;Transfers;Reach Overhead;Carry;Dressing;Stand;Stairs;Squat    Examination-Participation Restrictions Cleaning;Community Activity;Driving;Interpersonal Relationship;Laundry;Meal Prep    Stability/Clinical Decision Making Evolving/Moderate complexity    Rehab Potential Good    PT Frequency 2x / week    PT Duration 8 weeks    PT Treatment/Interventions ADLs/Self Care Home Management;Aquatic Therapy;Biofeedback;Electrical Stimulation;DME Instruction;Balance training;Therapeutic exercise;Therapeutic activities;Patient/family education;Functional mobility training;Stair training;Gait training;Neuromuscular re-education;Manual techniques;Vestibular;Orthotic Fit/Training    PT Next Visit Plan Continue with balance training and add this to HEP as able. Check for leg length discrepency (I mentioned this to her and she said she did not think it was affecting her so I didn't do)    PT Home Exercise Plan piriformis stretch    Consulted and Agree with Plan of Care Patient;Family member/caregiver    Family Member Consulted Rick: husband           Patient will benefit from skilled therapeutic intervention in order to improve the following deficits and impairments:  Abnormal gait, Decreased coordination, Decreased range of motion, Decreased endurance, Decreased knowledge of use of DME, Decreased balance, Decreased mobility, Decreased strength, Postural dysfunction, Impaired flexibility, Pain  Visit Diagnosis: Other abnormalities of gait and mobility  Unsteadiness on feet  Muscle weakness (generalized)     Problem List Patient Active Problem List   Diagnosis Date Noted  . Neurogenic bladder 06/29/2018  . Essential hypertension   . Cervical spondylosis without myelopathy   . Steroid-induced hyperglycemia   . Postoperative pain   . Hyponatremia   . Leucocytosis   . Thoracic myelopathy  04/22/2018  . ALLERGIC RHINITIS 01/19/2007    Vlad Mayberry L 03/28/2020, 3:50 PM  [facility] [address] [place], [place], [address] Phone: [phone]   Fax:  [phone]  Name: Cheryl Pearson MRN: [id number] Date of Birth: [birth year]  Zerita Boers, PT,DPT 03/28/20 3:51 PM Phone: [phone] Fax: [phone]

## 2020-04-01 ENCOUNTER — Other Ambulatory Visit: Payer: Self-pay

## 2020-04-01 ENCOUNTER — Encounter: Payer: Self-pay | Admitting: Rehabilitation

## 2020-04-01 ENCOUNTER — Ambulatory Visit: Payer: Medicare PPO | Admitting: Rehabilitation

## 2020-04-01 DIAGNOSIS — R2689 Other abnormalities of gait and mobility: Secondary | ICD-10-CM | POA: Diagnosis not present

## 2020-04-01 DIAGNOSIS — M6281 Muscle weakness (generalized): Secondary | ICD-10-CM

## 2020-04-01 DIAGNOSIS — R2681 Unsteadiness on feet: Secondary | ICD-10-CM

## 2020-04-01 NOTE — Patient Instructions (Addendum)
  Access Code: [email or web address] URL: [email or web address] Date: 04/01/2020 Prepared by: Harriet Butte  Exercises Supine Transversus Abdominis Bracing - Hands on Stomach - 3 x daily - 7 x weekly - 2 sets - 10 reps - 30 sec hold Supine March - 2 x daily - 5 x weekly - 1 sets - 10 reps Supine Transversus Abdominis Bracing with Leg Extension - 2 x daily - 5 x weekly - 1 sets - 10 reps Supine Transversus Abdominis Bracing with Double Leg Fallout - 2 x daily - 5 x weekly - 1 sets - 10 reps Beginner Bridge - 2 x daily - 5 x weekly - 1 sets - 10 reps Seated Table Hamstring Stretch - 1 x daily - 7 x weekly - 1 sets - 2-3 reps - 30-60 hold Wide Stance with Head Rotation on Foam Pad - 1 x daily - 5 x weekly - 1 sets - 10 reps Wide Stance with Head Nods on Foam Pad - 1 x daily - 5 x weekly - 1 sets - 10 reps Wide Stance with Eyes Closed on Foam Pad - 1 x daily - 5 x weekly - 1 sets - 2 reps - 20 secs hold Prone Quadriceps Stretch with Strap - 1 x daily - 7 x weekly - 1 sets - 2 reps - 30 secs hold

## 2020-04-01 NOTE — Therapy (Signed)
[facility] [address] [place], [place], [address] Phone: [phone]   Fax:  [phone]  Physical Therapy Treatment  Patient Details  Name: Cheryl Pearson MRN: [id number] Date of Birth: 12/08/1940 Referring Provider (PT): Dr. Lisbeth Renshaw   Encounter Date: 04/01/2020   PT End of Session - 04/01/20 1245    Visit Number 6    Number of Visits 17    Date for PT Re-Evaluation 06/05/20    Authorization Type [employer] medicare: auth 03/07/20-06/05/20    Progress Note Due on Visit 10    PT Start Time 0925    PT Stop Time 1015    PT Time Calculation (min) 50 min    Equipment Utilized During Treatment Gait belt    Activity Tolerance Patient tolerated treatment well    Behavior During Therapy [facility] for tasks assessed/performed           Past Medical History:  Diagnosis Date  . Anxiety   . Arthritis   . Colitis   . Early age-related macular degeneration   . Hernia   . Hypertension   . Infertility, female   . Menopausal symptoms   . Rosacea   . Thyroid disease 1968   hypothyroidism treated with meds for short time then all labs normal    Past Surgical History:  Procedure Laterality Date  . bone spur Left   . COLONOSCOPY  11/2011   polyp, ulcerative coliits inactive  . COLONOSCOPY  2005  . COLONOSCOPY  1970's   diagnosed with ulcerative colitis  . ESOPHAGOGASTRODUODENOSCOPY (EGD) WITH PROPOFOL N/A 11/20/2014   Procedure: ESOPHAGOGASTRODUODENOSCOPY (EGD) WITH PROPOFOL;  Surgeon: Carman Ching, MD;  Location: [facility];  Service: Endoscopy;  Laterality: N/A;  . INGUINAL HERNIA REPAIR Right 91 mos old  . LAPAROSCOPIC CHOLECYSTECTOMY  09/1998  . SAVORY DILATION N/A 11/20/2014   Procedure: SAVORY DILATION;  Surgeon: Carman Ching, MD;  Location: [facility];  Service: Endoscopy;  Laterality: N/A;  . WISDOM TOOTH EXTRACTION  1968    There were no vitals filed for this visit.   Subjective Assessment - 04/01/20  0928    Subjective Pt reports doing well, no changes since last session.    Patient is accompained by: Family member    Pertinent History Tx myelopathy, HTN, macular degeneration, B cataract extraction (2021), hypothyroidism years ago, anxiety, arthritis.    Patient Stated Goals To decr. pain    Currently in Pain? Yes    Pain Score 3     Pain Location Leg    Pain Orientation Right;Left    Pain Descriptors / Indicators Aching;Tightness    Pain Type Chronic pain    Pain Onset More than a month ago    Pain Frequency Intermittent    Aggravating Factors  worse in the am    Pain Relieving Factors better with medication and getting up and moving                             [facility] Adult PT Treatment/Exercise - 04/01/20 2993      Neuro Re-ed    Neuro Re-ed Details  Corner balance:  on foam feet apart EO with head turns up/down x 10 reps, side/side x 10 reps, feet apart EC x 2 reps of 20 secs.  Added these to HEP, see pt instructions.  Continued with multi sensory balance challenges in // bars standing on foam airex with feet apart marching in place  x 20 reps (intermittent support as needed), alternating cone taps x 10 reps progressing to tapping across body and then in front before returning to ground x 10 reps on each side (again intermittent support as needed).  Cues for improved R LE activation when lifting LLE.        Exercises   Other Exercises  Core strengthening: quadruped LE extension x 10 reps, hip abd (with knee flex-fire hydrant) x 10 reps, bird dog x 10 reps.  Min tactile cues for core activation throughout.  Performed supine piriformis stretch in figure 4, pulling thigh towards body as an alternative for sitting as she has more pain in am and would like for her to stretch prior to mobility in the morning.  Also attempted standing quad stretch, but unable to perform so provided and performed in prone with pillow under hips to maintain neutral spine with strap x 30 secs on  each leg.  Did add this to HEP as her quads are very tight.               Access Code: [email or web address] URL: [email or web address] Date: 04/01/2020 Prepared by: Harriet ButteEmily Karrina Lye  Exercises Supine Transversus Abdominis Bracing - Hands on Stomach - 3 x daily - 7 x weekly - 2 sets - 10 reps - 30 sec hold Supine March - 2 x daily - 5 x weekly - 1 sets - 10 reps Supine Transversus Abdominis Bracing with Leg Extension - 2 x daily - 5 x weekly - 1 sets - 10 reps Supine Transversus Abdominis Bracing with Double Leg Fallout - 2 x daily - 5 x weekly - 1 sets - 10 reps Beginner Bridge - 2 x daily - 5 x weekly - 1 sets - 10 reps Seated Table Hamstring Stretch - 1 x daily - 7 x weekly - 1 sets - 2-3 reps - 30-60 hold Wide Stance with Head Rotation on Foam Pad - 1 x daily - 5 x weekly - 1 sets - 10 reps Wide Stance with Head Nods on Foam Pad - 1 x daily - 5 x weekly - 1 sets - 10 reps Wide Stance with Eyes Closed on Foam Pad - 1 x daily - 5 x weekly - 1 sets - 2 reps - 20 secs hold Prone Quadriceps Stretch with Strap - 1 x daily - 7 x weekly - 1 sets - 2 reps - 30 secs hold  Performed those in bold during session       PT Education - 04/01/20 1244    Education Details Added balance and quad stretch to HEP    Person(s) Educated Patient;Spouse    Methods Explanation;Demonstration;Handout    Comprehension Verbalized understanding;Returned demonstration            PT Short Term Goals - 03/14/20 1555      PT SHORT TERM GOAL #1   Title Pt will be IND in HEP to improve strength, balance, and pain. TARGET DATE FOR ALL STGS: 04/04/20    Baseline No HEP    Time 4    Period Weeks    Status New      PT SHORT TERM GOAL #2   Title Pt will amb. 300', IND, over even indoor terrain to improve safety during amb. at home.    Time 4    Period Weeks    Status New      PT SHORT TERM GOAL #3   Title Perform FGA and write goals as indicated.    Time  4    Period Weeks    Status Achieved       PT SHORT TERM GOAL #4   Title Pt will report back pain during ADLs', at worst,  is </= 7/10 to improve QOL and ability to perform ADLs safely.    Baseline 10/10 at worst    Time 4    Period Weeks    Status New      PT SHORT TERM GOAL #5   Title Pt will improve FGA score to >/=17/30 to decr. falls risk.    Baseline 13/30    Time 4    Period Weeks    Status New             PT Long Term Goals - 03/14/20 1556      PT LONG TERM GOAL #1   Title Pt will amb. 500', IND, over even and paved outdoors surfaces in order to improve functional mobility.    Time 8    Period Weeks    Status New      PT LONG TERM GOAL #2   Title Pt will report LBP/hip pain at worst during ADLs is </=2/10 to improve QOL and perform ADLs safely.    Baseline 10/10    Time 8    Period Weeks    Status New      PT LONG TERM GOAL #3   Title Pt will report ability to stand for 30+ minutes without incr. in LBP in order to perform ADLs and improve QOL.    Baseline 10/10 at worst    Time 8    Period Weeks    Status New      PT LONG TERM GOAL #4   Title Pt will improve FGA score to >/=21/30 to decr. falls risk.    Baseline 13/30    Time 8    Period Weeks    Status New                 Plan - 04/01/20 1245    Clinical Impression Statement Session focused on balance exercises requiring vestibular input as she continues to have difficulty with compliant surfaces and eyes closed, added 3 to HEP.  Also continue to work on core and LE strengthening with quadruped activity.  Pt tolerated well.  Note pts L>R quad muscle is very tight and causes pain, therefore added/performed prone quad stretch during session as well.    Personal Factors and Comorbidities Age;Comorbidity 3+;Time since onset of injury/illness/exacerbation;Past/Current Experience    Comorbidities Tx myelopathy, HTN, macular degeneration, B cataract extraction (2021), hypothyroidism years ago, anxiety, arthritis.    Examination-Activity  Limitations Bend;Bathing;Locomotion Level;Transfers;Reach Overhead;Carry;Dressing;Stand;Stairs;Squat    Examination-Participation Restrictions Cleaning;Community Activity;Driving;Interpersonal Relationship;Laundry;Meal Prep    Stability/Clinical Decision Making Evolving/Moderate complexity    Rehab Potential Good    PT Frequency 2x / week    PT Duration 8 weeks    PT Treatment/Interventions ADLs/Self Care Home Management;Aquatic Therapy;Biofeedback;Electrical Stimulation;DME Instruction;Balance training;Therapeutic exercise;Therapeutic activities;Patient/family education;Functional mobility training;Stair training;Gait training;Neuromuscular re-education;Manual techniques;Vestibular;Orthotic Fit/Training    PT Next Visit Plan STGs 12/9, Continue with high level balance, esp compliant surfaces, EC and SLS, core strengthening, flexibility as needed    Consulted and Agree with Plan of Care Patient;Family member/caregiver    Family Member Consulted Rick: husband           Patient will benefit from skilled therapeutic intervention in order to improve the following deficits and impairments:  Abnormal gait, Decreased coordination, Decreased range of motion, Decreased endurance,  Decreased knowledge of use of DME, Decreased balance, Decreased mobility, Decreased strength, Postural dysfunction, Impaired flexibility, Pain  Visit Diagnosis: Other abnormalities of gait and mobility  Unsteadiness on feet  Muscle weakness (generalized)     Problem List Patient Active Problem List   Diagnosis Date Noted  . Neurogenic bladder 06/29/2018  . Essential hypertension   . Cervical spondylosis without myelopathy   . Steroid-induced hyperglycemia   . Postoperative pain   . Hyponatremia   . Leucocytosis   . Thoracic myelopathy 04/22/2018  . ALLERGIC RHINITIS 01/19/2007    Harriet Butte, PT, MPT [facility] [address] [place], [place], [address] Phone:  [phone]   Fax:  [phone] 04/01/20, 12:48 PM  Name: Cheryl Pearson MRN: [id number] Date of Birth: [birth year]

## 2020-04-04 ENCOUNTER — Ambulatory Visit: Payer: Medicare PPO

## 2020-04-04 ENCOUNTER — Other Ambulatory Visit: Payer: Self-pay

## 2020-04-04 DIAGNOSIS — R2681 Unsteadiness on feet: Secondary | ICD-10-CM

## 2020-04-04 DIAGNOSIS — G8929 Other chronic pain: Secondary | ICD-10-CM

## 2020-04-04 DIAGNOSIS — R2689 Other abnormalities of gait and mobility: Secondary | ICD-10-CM

## 2020-04-04 DIAGNOSIS — M5441 Lumbago with sciatica, right side: Secondary | ICD-10-CM

## 2020-04-04 DIAGNOSIS — M6281 Muscle weakness (generalized): Secondary | ICD-10-CM

## 2020-04-04 NOTE — Therapy (Signed)
[facility] [address] [place], [place], [address] Phone: [phone]   Fax:  [phone]  Physical Therapy Treatment  Patient Details  Name: Cheryl Pearson MRN: [id number] Date of Birth: 1940-10-12 Referring Provider (PT): Dr. Consuella Lose   Encounter Date: 04/04/2020   PT End of Session - 04/04/20 1504    Visit Number 7    Number of Visits 17    Date for PT Re-Evaluation 06/05/20    Authorization Type [employer] medicare: auth 03/07/20-06/05/20    Progress Note Due on Visit 10    PT Start Time 1318    PT Stop Time 1359    PT Time Calculation (min) 41 min    Equipment Utilized During Treatment Gait belt    Activity Tolerance Patient tolerated treatment well    Behavior During Therapy [facility] for tasks assessed/performed           Past Medical History:  Diagnosis Date  . Anxiety   . Arthritis   . Colitis   . Early age-related macular degeneration   . Hernia   . Hypertension   . Infertility, female   . Menopausal symptoms   . Rosacea   . Thyroid disease 1968   hypothyroidism treated with meds for short time then all labs normal    Past Surgical History:  Procedure Laterality Date  . bone spur Left   . COLONOSCOPY  11/2011   polyp, ulcerative coliits inactive  . COLONOSCOPY  2005  . COLONOSCOPY  1970's   diagnosed with ulcerative colitis  . ESOPHAGOGASTRODUODENOSCOPY (EGD) WITH PROPOFOL N/A 11/20/2014   Procedure: ESOPHAGOGASTRODUODENOSCOPY (EGD) WITH PROPOFOL;  Surgeon: Laurence Spates, MD;  Location: [facility];  Service: Endoscopy;  Laterality: N/A;  . INGUINAL HERNIA REPAIR Right 39 mos old  . LAPAROSCOPIC CHOLECYSTECTOMY  09/1998  . SAVORY DILATION N/A 11/20/2014   Procedure: SAVORY DILATION;  Surgeon: Laurence Spates, MD;  Location: [facility];  Service: Endoscopy;  Laterality: N/A;  . WISDOM TOOTH EXTRACTION  1968    There were no vitals filed for this visit.   Subjective Assessment - 04/04/20  1323    Subjective Pt reports doing well, no changes since last session. Pt reported she did a few too many stretches yesterday and felt it this morning but feels better now.    Pertinent History Tx myelopathy, HTN, macular degeneration, B cataract extraction (2021), hypothyroidism years ago, anxiety, arthritis.    Patient Stated Goals To decr. pain    Currently in Pain? No/denies              [facility] PT Assessment - 04/04/20 1327      Functional Gait  Assessment   Gait assessed  Yes    Gait Level Surface Walks 20 ft, slow speed, abnormal gait pattern, evidence for imbalance or deviates 10-15 in outside of the 12 in walkway width. Requires more than 7 sec to ambulate 20 ft.   7.5 sec.   Change in Gait Speed Able to change speed, demonstrates mild gait deviations, deviates 6-10 in outside of the 12 in walkway width, or no gait deviations, unable to achieve a major change in velocity, or uses a change in velocity, or uses an assistive device.    Gait with Horizontal Head Turns Performs head turns smoothly with slight change in gait velocity (eg, minor disruption to smooth gait path), deviates 6-10 in outside 12 in walkway width, or uses an assistive device.    Gait with Vertical Head Turns Performs  task with slight change in gait velocity (eg, minor disruption to smooth gait path), deviates 6 - 10 in outside 12 in walkway width or uses assistive device    Gait and Pivot Turn Pivot turns safely in greater than 3 sec and stops with no loss of balance, or pivot turns safely within 3 sec and stops with mild imbalance, requires small steps to catch balance.    Step Over Obstacle Is able to step over one shoe box (4.5 in total height) but must slow down and adjust steps to clear box safely. May require verbal cueing.    Gait with Narrow Base of Support Ambulates 4-7 steps.   6 steps   Gait with Eyes Closed Walks 20 ft, uses assistive device, slower speed, mild gait deviations, deviates 6-10 in outside 12  in walkway width. Ambulates 20 ft in less than 9 sec but greater than 7 sec.    Ambulating Backwards Walks 20 ft, uses assistive device, slower speed, mild gait deviations, deviates 6-10 in outside 12 in walkway width.    Steps Alternating feet, must use rail.    Total Score 17    FGA comment: 17/30: high falls risk                         [facility] Adult PT Treatment/Exercise - 04/04/20 1327      Ambulation/Gait   Ambulation/Gait Yes    Ambulation/Gait Assistance 7: Independent    Ambulation/Gait Assistance Details Pt demonstrated good stride length, reciprocal, and no LOB during gait indoors over even terrain.    Ambulation Distance (Feet) 345 Feet   and 75, 100'   Assistive device None    Gait Pattern Step-through pattern    Ambulation Surface Level;Indoor    Gait velocity 3.95f/sec. and 3.349fsec.    Gait Comments Pt reported she feels more comfortable walking vs. day of eval per pt.               Balance Exercises - 04/04/20 1352      Balance Exercises: Seated   Other Seated Exercises Comments Pt performed on blue physio ball to improve core activation, balance and strength: alternating marches, B shoulder flexion-alternating, marches with contralateral shoulder flexion x10/activity. Performed with min guard to S and cues for proper technique.             PT Education - 04/04/20 1504    Education Details PT educated pt on outcome measures and progress of STGs. PT added physio ball exercises to HEP-will print next session.    Person(s) Educated Patient;Spouse    Methods Explanation;Demonstration;Tactile cues;Verbal cues    Comprehension Returned demonstration;Verbalized understanding;Need further instruction            PT Short Term Goals - 04/04/20 1326      PT SHORT TERM GOAL #1   Title Pt will be IND in HEP to improve strength, balance, and pain. TARGET DATE FOR ALL STGS: 04/04/20    Baseline No HEP    Time 4    Period Weeks    Status New       PT SHORT TERM GOAL #2   Title Pt will amb. 300', IND, over even indoor terrain to improve safety during amb. at home.    Time 4    Period Weeks    Status Achieved      PT SHORT TERM GOAL #3   Title Perform FGA and write goals as indicated.  Time 4    Period Weeks    Status Achieved      PT SHORT TERM GOAL #4   Title Pt will report back pain during ADLs', at worst,  is </= 7/10 to improve QOL and ability to perform ADLs safely.    Baseline 10/10 at worst at eval and pt reporting 6/10 on 04/04/20.    Time 4    Period Weeks    Status Achieved      PT SHORT TERM GOAL #5   Title Pt will improve FGA score to >/=17/30 to decr. falls risk.    Baseline 13/30 on eval, 17/30 on 04/04/20    Time 4    Period Weeks    Status Achieved             PT Long Term Goals - 03/14/20 1556      PT LONG TERM GOAL #1   Title Pt will amb. 500', IND, over even and paved outdoors surfaces in order to improve functional mobility.    Time 8    Period Weeks    Status New      PT LONG TERM GOAL #2   Title Pt will report LBP/hip pain at worst during ADLs is </=2/10 to improve QOL and perform ADLs safely.    Baseline 10/10    Time 8    Period Weeks    Status New      PT LONG TERM GOAL #3   Title Pt will report ability to stand for 30+ minutes without incr. in LBP in order to perform ADLs and improve QOL.    Baseline 10/10 at worst    Time 8    Period Weeks    Status New      PT LONG TERM GOAL #4   Title Pt will improve FGA score to >/=21/30 to decr. falls risk.    Baseline 13/30    Time 8    Period Weeks    Status New                 Plan - 04/04/20 1506    Clinical Impression Statement Pt demonstrated progress as she met STGs 2, 3, 4, and 5. PT will assess STG 1 (HEP) next session. Pt's gait speed has also improved without AD. However, pt's FGA score has improved but still indicated pt is at high risk for falls. Pt also noted to experience incr. postural sway with 180 degree  turns during amb. Pt would continue to benefit from skilled PT to improve pain and safety during functional mobility, will continue to progress towards LTGs.    Personal Factors and Comorbidities Age;Comorbidity 3+;Time since onset of injury/illness/exacerbation;Past/Current Experience    Comorbidities Tx myelopathy, HTN, macular degeneration, B cataract extraction (2021), hypothyroidism years ago, anxiety, arthritis.    Examination-Activity Limitations Bend;Bathing;Locomotion Level;Transfers;Reach Overhead;Carry;Dressing;Stand;Stairs;Squat    Examination-Participation Restrictions Cleaning;Community Activity;Driving;Interpersonal Relationship;Laundry;Meal Prep    Stability/Clinical Decision Making Evolving/Moderate complexity    Rehab Potential Good    PT Frequency 2x / week    PT Duration 8 weeks    PT Treatment/Interventions ADLs/Self Care Home Management;Aquatic Therapy;Biofeedback;Electrical Stimulation;DME Instruction;Balance training;Therapeutic exercise;Therapeutic activities;Patient/family education;Functional mobility training;Stair training;Gait training;Neuromuscular re-education;Manual techniques;Vestibular;Orthotic Fit/Training    PT Next Visit Plan Review/assess/modify STG 1 (HEP) and add physioball marches with contralateral shoulder flexion to HEP. Continue with high level balance, esp compliant surfaces, EC and SLS, core strengthening, flexibility as needed    Consulted and Agree with Plan of Care Patient;Family member/caregiver  Family Member Consulted Rick: husband           Patient will benefit from skilled therapeutic intervention in order to improve the following deficits and impairments:  Abnormal gait,Decreased coordination,Decreased range of motion,Decreased endurance,Decreased knowledge of use of DME,Decreased balance,Decreased mobility,Decreased strength,Postural dysfunction,Impaired flexibility,Pain  Visit Diagnosis: Other abnormalities of gait and  mobility  Unsteadiness on feet  Muscle weakness (generalized)  Chronic midline low back pain with bilateral sciatica     Problem List Patient Active Problem List   Diagnosis Date Noted  . Neurogenic bladder 06/29/2018  . Essential hypertension   . Cervical spondylosis without myelopathy   . Steroid-induced hyperglycemia   . Postoperative pain   . Hyponatremia   . Leucocytosis   . Thoracic myelopathy 04/22/2018  . ALLERGIC RHINITIS 01/19/2007    Charlyn Vialpando L 04/04/2020, 3:10 PM  [facility] [address] [place], [place], [address] Phone: [phone]   Fax:  [phone]  Name: Cheryl Pearson MRN: [id number] Date of Birth: [birth year]  Geoffry Paradise, PT,DPT 04/04/20 3:11 PM Phone: [phone] Fax: [phone]

## 2020-04-08 ENCOUNTER — Encounter: Payer: Self-pay | Admitting: Rehabilitation

## 2020-04-08 ENCOUNTER — Other Ambulatory Visit: Payer: Self-pay

## 2020-04-08 ENCOUNTER — Ambulatory Visit: Payer: Medicare PPO | Admitting: Rehabilitation

## 2020-04-08 DIAGNOSIS — R2689 Other abnormalities of gait and mobility: Secondary | ICD-10-CM

## 2020-04-08 DIAGNOSIS — R2681 Unsteadiness on feet: Secondary | ICD-10-CM

## 2020-04-08 DIAGNOSIS — M6281 Muscle weakness (generalized): Secondary | ICD-10-CM

## 2020-04-08 NOTE — Therapy (Signed)
[facility] [address] [place], [place], [address] Phone: [phone]   Fax:  [phone]  Physical Therapy Treatment  Patient Details  Name: Cheryl Pearson MRN: [id number] Date of Birth: 07/18/1940 Referring Provider (PT): Dr. Consuella Lose   Encounter Date: 04/08/2020   PT End of Session - 04/08/20 1509    Visit Number 8    Number of Visits 17    Date for PT Re-Evaluation 06/05/20    Authorization Type [employer]: auth 03/07/20-06/05/20    Progress Note Due on Visit 10    PT Start Time 1148    PT Stop Time 1231    PT Time Calculation (min) 43 min    Equipment Utilized During Treatment Gait belt    Activity Tolerance Patient tolerated treatment well    Behavior During Therapy [facility] for tasks assessed/performed           Past Medical History:  Diagnosis Date  . Anxiety   . Arthritis   . Colitis   . Early age-related macular degeneration   . Hernia   . Hypertension   . Infertility, female   . Menopausal symptoms   . Rosacea   . Thyroid disease 1968   hypothyroidism treated with meds for short time then all labs normal    Past Surgical History:  Procedure Laterality Date  . bone spur Left   . COLONOSCOPY  11/2011   polyp, ulcerative coliits inactive  . COLONOSCOPY  2005  . COLONOSCOPY  1970's   diagnosed with ulcerative colitis  . ESOPHAGOGASTRODUODENOSCOPY (EGD) WITH PROPOFOL N/A 11/20/2014   Procedure: ESOPHAGOGASTRODUODENOSCOPY (EGD) WITH PROPOFOL;  Surgeon: Laurence Spates, MD;  Location: [facility];  Service: Endoscopy;  Laterality: N/A;  . INGUINAL HERNIA REPAIR Right 11 mos old  . LAPAROSCOPIC CHOLECYSTECTOMY  09/1998  . SAVORY DILATION N/A 11/20/2014   Procedure: SAVORY DILATION;  Surgeon: Laurence Spates, MD;  Location: [facility];  Service: Endoscopy;  Laterality: N/A;  . WISDOM TOOTH EXTRACTION  1968    There were no vitals filed for this visit.   Subjective Assessment - 04/08/20  1153    Subjective Still having more pain in the morning, stretching aggravates pain somewhat. Modified quad stretch to standing with leg propped on chair with UE support.    Patient is accompained by: Family member    Pertinent History Tx myelopathy, HTN, macular degeneration, B cataract extraction (2021), hypothyroidism years ago, anxiety, arthritis.    How long can you stand comfortably? Pt has pain as soon as she wakes up.    How long can you walk comfortably? It depends, but pain is better in afternoon: less than five minutes    Patient Stated Goals To decr. pain    Currently in Pain? No/denies                             [facility] Adult PT Treatment/Exercise - 04/08/20 1150      Ambulation/Gait   Ambulation/Gait Yes    Ambulation/Gait Assistance 5: Supervision    Ambulation/Gait Assistance Details At end of session worked on gait with larger step width for improved balance and gait speed.  Note marked improvement.  She reports at times (in am) that she takes small steps as she is afraid she will fall.  Educatd on why short shuffled steps will increase fall risk.    Ambulation Distance (Feet) 200 Feet    Assistive device None  Gait Pattern Step-through pattern    Ambulation Surface Level;Indoor      High Level Balance   High Level Balance Comments Reviewed corner balance tasks during session, see pt instruction.  Had her narrow feet slightly to increase difficulty with head turns.  Also worked on ankle/hip strategy with standing on ramp incline (on soft mat) in staggered foot position maintaiing balance x 20 secs on each side x 2 sets.  Intermittent min/guard for safety.  Forward/retro stepping from foam airex to ground x 10 reps each side with light UE support as needed.  Progressing to lateral stepping to ground from foam x 10 reps on each side.  cues for controlled slow movement and improved stance leg activation.      Exercises   Other Exercises  For hip and thigh  strength:  Standing on 4" step with BUE support lowering whole foot to ground slowly x 10 reps on each side progressing to cloesd chain hip abd on step with BUE support x 10 reps on each side.              Access Code: [email or web address] URL: [email or web address] Date: 04/08/2020 Prepared by: Cameron Sprang  Exercises Supine Transversus Abdominis Bracing - Hands on Stomach - 3 x daily - 7 x weekly - 2 sets - 10 reps - 30 sec hold Supine Transversus Abdominis Bracing with Leg Extension - 2 x daily - 5 x weekly - 1 sets - 10 reps Supine Transversus Abdominis Bracing with Double Leg Fallout - 2 x daily - 5 x weekly - 1 sets - 10 reps Beginner Bridge - 2 x daily - 5 x weekly - 1 sets - 10 reps Seated Table Hamstring Stretch - 1 x daily - 7 x weekly - 1 sets - 2-3 reps - 30-60 hold Wide Stance with Head Rotation on Foam Pad - 1 x daily - 5 x weekly - 1 sets - 10 reps (had her narrow feet) Wide Stance with Head Nods on Foam Pad - 1 x daily - 5 x weekly - 1 sets - 10 reps (had her narrow feet) Wide Stance with Eyes Closed on Foam Pad - 1 x daily - 5 x weekly - 1 sets - 2 reps - 20 secs hold (had her narrow feet) Prone Quadriceps Stretch with Strap - 1 x daily - 7 x weekly - 1 sets - 2 reps - 30 secs hold Seated March with Opposite Arm Flexion on Swiss Ball - 1 x daily - 5 x weekly - 1 sets - 10 reps       PT Short Term Goals - 04/08/20 1510      PT SHORT TERM GOAL #1   Title Pt will be IND in HEP to improve strength, balance, and pain. TARGET DATE FOR ALL STGS: 04/04/20    Baseline met    Time 4    Period Weeks    Status Achieved      PT SHORT TERM GOAL #2   Title Pt will amb. 300', IND, over even indoor terrain to improve safety during amb. at home.    Time 4    Period Weeks    Status Achieved      PT SHORT TERM GOAL #3   Title Perform FGA and write goals as indicated.    Time 4    Period Weeks    Status Achieved      PT SHORT TERM GOAL #4   Title Pt will report back  pain  during ADLs', at worst,  is </= 7/10 to improve QOL and ability to perform ADLs safely.    Baseline 10/10 at worst at eval and pt reporting 6/10 on 04/04/20.    Time 4    Period Weeks    Status Achieved      PT SHORT TERM GOAL #5   Title Pt will improve FGA score to >/=17/30 to decr. falls risk.    Baseline 13/30 on eval, 17/30 on 04/04/20    Time 4    Period Weeks    Status Achieved             PT Long Term Goals - 03/14/20 1556      PT LONG TERM GOAL #1   Title Pt will amb. 500', IND, over even and paved outdoors surfaces in order to improve functional mobility.    Time 8    Period Weeks    Status New      PT LONG TERM GOAL #2   Title Pt will report LBP/hip pain at worst during ADLs is </=2/10 to improve QOL and perform ADLs safely.    Baseline 10/10    Time 8    Period Weeks    Status New      PT LONG TERM GOAL #3   Title Pt will report ability to stand for 30+ minutes without incr. in LBP in order to perform ADLs and improve QOL.    Baseline 10/10 at worst    Time 8    Period Weeks    Status New      PT LONG TERM GOAL #4   Title Pt will improve FGA score to >/=21/30 to decr. falls risk.    Baseline 13/30    Time 8    Period Weeks    Status New                 Plan - 04/08/20 1509    Clinical Impression Statement Pt has met STG for HEP and added additional physioball exercise today.  Had her narrow her feet slightly for corner balance tasks and continued to work on high level balance and stepping during session along with LE strengthening.  Pt tolerated well.    Personal Factors and Comorbidities Age;Comorbidity 3+;Time since onset of injury/illness/exacerbation;Past/Current Experience    Comorbidities Tx myelopathy, HTN, macular degeneration, B cataract extraction (2021), hypothyroidism years ago, anxiety, arthritis.    Examination-Activity Limitations Bend;Bathing;Locomotion Level;Transfers;Reach Overhead;Carry;Dressing;Stand;Stairs;Squat     Examination-Participation Restrictions Cleaning;Community Activity;Driving;Interpersonal Relationship;Laundry;Meal Prep    Stability/Clinical Decision Making Evolving/Moderate complexity    Rehab Potential Good    PT Frequency 2x / week    PT Duration 8 weeks    PT Treatment/Interventions ADLs/Self Care Home Management;Aquatic Therapy;Biofeedback;Electrical Stimulation;DME Instruction;Balance training;Therapeutic exercise;Therapeutic activities;Patient/family education;Functional mobility training;Stair training;Gait training;Neuromuscular re-education;Manual techniques;Vestibular;Orthotic Fit/Training    PT Next Visit Plan Continue with high level balance, esp compliant surfaces, EC and SLS, core strengthening, flexibility as needed    Consulted and Agree with Plan of Care Patient;Family member/caregiver    Family Member Consulted Rick: husband           Patient will benefit from skilled therapeutic intervention in order to improve the following deficits and impairments:  Abnormal gait,Decreased coordination,Decreased range of motion,Decreased endurance,Decreased knowledge of use of DME,Decreased balance,Decreased mobility,Decreased strength,Postural dysfunction,Impaired flexibility,Pain  Visit Diagnosis: Other abnormalities of gait and mobility  Unsteadiness on feet  Muscle weakness (generalized)     Problem List Patient Active Problem List   Diagnosis Date  Noted  . Neurogenic bladder 06/29/2018  . Essential hypertension   . Cervical spondylosis without myelopathy   . Steroid-induced hyperglycemia   . Postoperative pain   . Hyponatremia   . Leucocytosis   . Thoracic myelopathy 04/22/2018  . ALLERGIC RHINITIS 01/19/2007    Cameron Sprang, PT, MPT [facility] [address] [place], [place], [address] Phone: [phone]   Fax:  [phone] 04/08/20, 3:12 PM  Name: Cheryl Pearson MRN: [id number] Date of Birth: 1940/05/21

## 2020-04-08 NOTE — Patient Instructions (Addendum)
Access Code: [email or web address] URL: [email or web address] Date: 04/08/2020 Prepared by: Harriet Butte  Exercises Supine Transversus Abdominis Bracing - Hands on Stomach - 3 x daily - 7 x weekly - 2 sets - 10 reps - 30 sec hold Supine Transversus Abdominis Bracing with Leg Extension - 2 x daily - 5 x weekly - 1 sets - 10 reps Supine Transversus Abdominis Bracing with Double Leg Fallout - 2 x daily - 5 x weekly - 1 sets - 10 reps Beginner Bridge - 2 x daily - 5 x weekly - 1 sets - 10 reps Seated Table Hamstring Stretch - 1 x daily - 7 x weekly - 1 sets - 2-3 reps - 30-60 hold Wide Stance with Head Rotation on Foam Pad - 1 x daily - 5 x weekly - 1 sets - 10 reps (had her narrow feet) Wide Stance with Head Nods on Foam Pad - 1 x daily - 5 x weekly - 1 sets - 10 reps (had her narrow feet) Wide Stance with Eyes Closed on Foam Pad - 1 x daily - 5 x weekly - 1 sets - 2 reps - 20 secs hold (had her narrow feet) Prone Quadriceps Stretch with Strap - 1 x daily - 7 x weekly - 1 sets - 2 reps - 30 secs hold Seated March with Opposite Arm Flexion on Swiss Ball - 1 x daily - 5 x weekly - 1 sets - 10 reps

## 2020-04-11 ENCOUNTER — Ambulatory Visit: Payer: Medicare PPO

## 2020-04-11 ENCOUNTER — Other Ambulatory Visit: Payer: Self-pay

## 2020-04-11 DIAGNOSIS — R2689 Other abnormalities of gait and mobility: Secondary | ICD-10-CM | POA: Diagnosis not present

## 2020-04-11 DIAGNOSIS — R2681 Unsteadiness on feet: Secondary | ICD-10-CM

## 2020-04-11 NOTE — Therapy (Signed)
[facility] [address] [place], [place], [address] Phone: [phone]   Fax:  [phone]  Physical Therapy Treatment  Patient Details  Name: Cheryl Pearson MRN: [id number] Date of Birth: 11-09-1940 Referring Provider (PT): Dr. Consuella Lose   Encounter Date: 04/11/2020   PT End of Session - 04/11/20 1724    Visit Number 9    Number of Visits 17    Date for PT Re-Evaluation 06/05/20    Authorization Type [employer] medicare: auth 03/07/20-06/05/20    Progress Note Due on Visit 10    PT Start Time 1316    PT Stop Time 1358    PT Time Calculation (min) 42 min    Equipment Utilized During Treatment Gait belt    Activity Tolerance Patient tolerated treatment well    Behavior During Therapy [facility] for tasks assessed/performed           Past Medical History:  Diagnosis Date   Anxiety    Arthritis    Colitis    Early age-related macular degeneration    Hernia    Hypertension    Infertility, female    Menopausal symptoms    Rosacea    Thyroid disease 1968   hypothyroidism treated with meds for short time then all labs normal    Past Surgical History:  Procedure Laterality Date   bone spur Left    COLONOSCOPY  11/2011   polyp, ulcerative coliits inactive   COLONOSCOPY  2005   COLONOSCOPY  1970's   diagnosed with ulcerative colitis   ESOPHAGOGASTRODUODENOSCOPY (EGD) WITH PROPOFOL N/A 11/20/2014   Procedure: ESOPHAGOGASTRODUODENOSCOPY (EGD) WITH PROPOFOL;  Surgeon: Laurence Spates, MD;  Location: [facility];  Service: Endoscopy;  Laterality: N/A;   INGUINAL HERNIA REPAIR Right 54 mos old   LAPAROSCOPIC CHOLECYSTECTOMY  09/1998   SAVORY DILATION N/A 11/20/2014   Procedure: SAVORY DILATION;  Surgeon: Laurence Spates, MD;  Location: [facility];  Service: Endoscopy;  Laterality: N/A;   WISDOM TOOTH EXTRACTION  1968    There were no vitals filed for this visit.   Subjective Assessment - 04/11/20  1321    Subjective Pt denied falls or changes since last visit. Pt was sore after last visit but it was tolerable per pt. Pt reported she's walking 0.5 miles outside with husband about 4 days/week.    Patient is accompained by: Family member    Pertinent History Tx myelopathy, HTN, macular degeneration, B cataract extraction (2021), hypothyroidism years ago, anxiety, arthritis.    Patient Stated Goals To decr. pain    Currently in Pain? No/denies                             [facility] Adult PT Treatment/Exercise - 04/11/20 1322      Ambulation/Gait   Ambulation/Gait Yes    Ambulation/Gait Assistance 5: Supervision;4: Min guard    Ambulation/Gait Assistance Details Pt amb. in/outdoors over even, uneven paved surfaces. Cues to improve heel strike, arm swing, and stride length outdoors. Incr. sway while turning outdoors.    Ambulation Distance (Feet) 500 Feet   outdoors, 115'x3 indoors   Assistive device None   intermittent use of cane with attachment   Gait Pattern Step-through pattern;Decreased stride length    Ambulation Surface Level;Indoor;Outdoor;Unlevel;Paved      High Level Balance   High Level Balance Activities Backward walking;Tandem walking;Head turns;Other (comment)   head nods   High Level Balance Comments Performed in //bars  with red and blue mats: 4-6x/activity. Cues and demo for weight shifting during tandem gait, heel strike with head turns/nods.  min gaurd to S prn.                    PT Short Term Goals - 04/08/20 1510      PT SHORT TERM GOAL #1   Title Pt will be IND in HEP to improve strength, balance, and pain. TARGET DATE FOR ALL STGS: 04/04/20    Baseline met    Time 4    Period Weeks    Status Achieved      PT SHORT TERM GOAL #2   Title Pt will amb. 300', IND, over even indoor terrain to improve safety during amb. at home.    Time 4    Period Weeks    Status Achieved      PT SHORT TERM GOAL #3   Title Perform FGA and write goals  as indicated.    Time 4    Period Weeks    Status Achieved      PT SHORT TERM GOAL #4   Title Pt will report back pain during ADLs', at worst,  is </= 7/10 to improve QOL and ability to perform ADLs safely.    Baseline 10/10 at worst at eval and pt reporting 6/10 on 04/04/20.    Time 4    Period Weeks    Status Achieved      PT SHORT TERM GOAL #5   Title Pt will improve FGA score to >/=17/30 to decr. falls risk.    Baseline 13/30 on eval, 17/30 on 04/04/20    Time 4    Period Weeks    Status Achieved             PT Long Term Goals - 04/11/20 1724      PT LONG TERM GOAL #1   Title Pt will amb. 500', IND, over even and paved outdoors surfaces in order to improve functional mobility. TARGET DATE FOR ALL LTGS: 04/25/20    Time 8    Period Weeks    Status New      PT LONG TERM GOAL #2   Title Pt will report LBP/hip pain at worst during ADLs is </=2/10 to improve QOL and perform ADLs safely.    Baseline 10/10    Time 8    Period Weeks    Status New      PT LONG TERM GOAL #3   Title Pt will report ability to stand for 30+ minutes without incr. in LBP in order to perform ADLs and improve QOL.    Baseline 10/10 at worst    Time 8    Period Weeks    Status New      PT LONG TERM GOAL #4   Title Pt will improve FGA score to >/=21/30 to decr. falls risk.    Baseline 13/30    Time 8    Period Weeks    Status New                 Plan - 04/11/20 1725    Clinical Impression Statement Pt demonstrated progress as she was able to tolerate high level balance and gait over uneven, paved surfaces without a rest break or LOB. Pt used cane intermittently today vs. constantly during amb. Pt did experience incr. postural sway while performing high level balance activities over compliant surfaces. Pt would continue to benefit from skilled PT  to improve safety during functional mobility.    Personal Factors and Comorbidities Age;Comorbidity 3+;Time since onset of  injury/illness/exacerbation;Past/Current Experience    Comorbidities Tx myelopathy, HTN, macular degeneration, B cataract extraction (2021), hypothyroidism years ago, anxiety, arthritis.    Examination-Activity Limitations Bend;Bathing;Locomotion Level;Transfers;Reach Overhead;Carry;Dressing;Stand;Stairs;Squat    Examination-Participation Restrictions Cleaning;Community Activity;Driving;Interpersonal Relationship;Laundry;Meal Prep    Stability/Clinical Decision Making Evolving/Moderate complexity    Rehab Potential Good    PT Frequency 2x / week    PT Duration 8 weeks    PT Treatment/Interventions ADLs/Self Care Home Management;Aquatic Therapy;Biofeedback;Electrical Stimulation;DME Instruction;Balance training;Therapeutic exercise;Therapeutic activities;Patient/family education;Functional mobility training;Stair training;Gait training;Neuromuscular re-education;Manual techniques;Vestibular;Orthotic Fit/Training    PT Next Visit Plan Progress note due next session. Continue with high level balance, esp compliant surfaces, EC and SLS, core strengthening, flexibility as needed    Consulted and Agree with Plan of Care Patient;Family member/caregiver    Family Member Consulted Rick: husband           Patient will benefit from skilled therapeutic intervention in order to improve the following deficits and impairments:  Abnormal gait,Decreased coordination,Decreased range of motion,Decreased endurance,Decreased knowledge of use of DME,Decreased balance,Decreased mobility,Decreased strength,Postural dysfunction,Impaired flexibility,Pain  Visit Diagnosis: Other abnormalities of gait and mobility  Unsteadiness on feet     Problem List Patient Active Problem List   Diagnosis Date Noted   Neurogenic bladder 06/29/2018   Essential hypertension    Cervical spondylosis without myelopathy    Steroid-induced hyperglycemia    Postoperative pain    Hyponatremia    Leucocytosis    Thoracic  myelopathy 04/22/2018   ALLERGIC RHINITIS 01/19/2007    Golden Emile L 04/11/2020, 5:32 PM  [facility] [address] [place], [place], [address] Phone: [phone]   Fax:  [phone]  Name: Cheryl Pearson MRN: [id number] Date of Birth: 1941-04-10  Geoffry Paradise, PT,DPT 04/11/20 5:33 PM Phone: [phone] Fax: [phone]

## 2020-04-16 ENCOUNTER — Other Ambulatory Visit: Payer: Self-pay

## 2020-04-16 ENCOUNTER — Ambulatory Visit: Payer: Medicare PPO

## 2020-04-16 DIAGNOSIS — R2681 Unsteadiness on feet: Secondary | ICD-10-CM

## 2020-04-16 DIAGNOSIS — R2689 Other abnormalities of gait and mobility: Secondary | ICD-10-CM

## 2020-04-16 DIAGNOSIS — M6281 Muscle weakness (generalized): Secondary | ICD-10-CM

## 2020-04-16 NOTE — Patient Instructions (Signed)
Access Code: [email or web address] URL: [email or web address] Date: 04/16/2020 Prepared by: Elmer Bales  Exercises Supine Transversus Abdominis Bracing - Hands on Stomach - 3 x daily - 7 x weekly - 2 sets - 10 reps - 30 sec hold Supine Transversus Abdominis Bracing with Leg Extension - 2 x daily - 5 x weekly - 1 sets - 10 reps Supine Transversus Abdominis Bracing with Double Leg Fallout - 2 x daily - 5 x weekly - 1 sets - 10 reps Beginner Bridge - 2 x daily - 5 x weekly - 1 sets - 10 reps Seated Table Hamstring Stretch - 1 x daily - 7 x weekly - 1 sets - 2-3 reps - 30-60 hold Wide Stance with Head Rotation on Foam Pad - 1 x daily - 5 x weekly - 1 sets - 10 reps Wide Stance with Head Nods on Foam Pad - 1 x daily - 5 x weekly - 1 sets - 10 reps Wide Stance with Eyes Closed on Foam Pad - 1 x daily - 5 x weekly - 1 sets - 2 reps - 20 secs hold Prone Quadriceps Stretch with Strap - 1 x daily - 7 x weekly - 1 sets - 2 reps - 30 secs hold Seated March with Opposite Arm Flexion on Swiss Ball - 1 x daily - 5 x weekly - 1 sets - 10 reps Tandem Stance in Corner - 2 x daily - 5 x weekly - 1 sets - 3 reps - 30 sec hold

## 2020-04-16 NOTE — Therapy (Signed)
[facility] [address] [place], [place], [address] Phone: [phone]   Fax:  [phone]  Physical Therapy Treatment/Progress note  Patient Details  Name: Cheryl Pearson MRN: [id number] Date of Birth: 1941/04/06 Referring Provider (PT): Dr. Consuella Lose     Progress Note  Reporting period 03/07/20 to 04/16/20  See Note below for Objective Data and Assessment of Progress/Goals  Encounter Date: 04/16/2020   PT End of Session - 04/16/20 1406    Visit Number 10    Number of Visits 17    Date for PT Re-Evaluation 06/05/20    Authorization Type [employer]: auth 03/07/20-06/05/20    Progress Note Due on Visit 10    PT Start Time 1401    PT Stop Time 1444    PT Time Calculation (min) 43 min    Equipment Utilized During Treatment Gait belt    Activity Tolerance Patient tolerated treatment well    Behavior During Therapy [facility] for tasks assessed/performed           Past Medical History:  Diagnosis Date  . Anxiety   . Arthritis   . Colitis   . Early age-related macular degeneration   . Hernia   . Hypertension   . Infertility, female   . Menopausal symptoms   . Rosacea   . Thyroid disease 1968   hypothyroidism treated with meds for short time then all labs normal    Past Surgical History:  Procedure Laterality Date  . bone spur Left   . COLONOSCOPY  11/2011   polyp, ulcerative coliits inactive  . COLONOSCOPY  2005  . COLONOSCOPY  1970's   diagnosed with ulcerative colitis  . ESOPHAGOGASTRODUODENOSCOPY (EGD) WITH PROPOFOL N/A 11/20/2014   Procedure: ESOPHAGOGASTRODUODENOSCOPY (EGD) WITH PROPOFOL;  Surgeon: Laurence Spates, MD;  Location: [facility];  Service: Endoscopy;  Laterality: N/A;  . INGUINAL HERNIA REPAIR Right 58 mos old  . LAPAROSCOPIC CHOLECYSTECTOMY  09/1998  . SAVORY DILATION N/A 11/20/2014   Procedure: SAVORY DILATION;  Surgeon: Laurence Spates, MD;  Location: [facility];  Service:  Endoscopy;  Laterality: N/A;  . WISDOM TOOTH EXTRACTION  1968    There were no vitals filed for this visit.   Subjective Assessment - 04/16/20 1406    Subjective Pt reports that she is doing well. Going out of town for Christmas so needs to cancel Thursday visit.    Patient is accompained by: Family member    Pertinent History Tx myelopathy, HTN, macular degeneration, B cataract extraction (2021), hypothyroidism years ago, anxiety, arthritis.    Patient Stated Goals To decr. pain    Currently in Pain? Yes    Pain Score 1     Pain Location Hip    Pain Orientation Right;Left    Pain Descriptors / Indicators Aching    Pain Onset More than a month ago    Pain Frequency Intermittent    Aggravating Factors  certain movements especially when first gets up in morning.    Pain Relieving Factors medication and getting up and moving.                             [facility] Adult PT Treatment/Exercise - 04/16/20 1409      Ambulation/Gait   Ambulation/Gait Yes    Ambulation/Gait Assistance 5: Supervision    Ambulation/Gait Assistance Details Pt was instructed to relax arms to allow more trunk rotation and arm swing and increase step length.  Ambulation Distance (Feet) 345 Feet    Assistive device None    Gait Pattern Step-through pattern;Decreased step length - right;Decreased step length - left    Ambulation Surface Level;Indoor      Neuro Re-ed    Neuro Re-ed Details  Dynamic gait activities: marching forward 30' x 2 and backwards gait 30' x 2. In // bars: tandem gait without UE support only occasionally touching bars 8' x 6, tandem stance 30 sec x 2 each position. Pt reported feeling easier to perform with visual cues from mirror. Corner balance: standing on pillow with feet together x 30 sec eyes open then alternating shoulder flexion with tightening tummy x 10, eyes closed x 30 sec, then adding in alternating shoulder flexion x 10, then eyes closed with head turns left/right  and up/down x 10. Tandem stance in corner 30 sec each position.Close SBA/CGA for safety. Husband observing throughout.                    PT Short Term Goals - 04/08/20 1510      PT SHORT TERM GOAL #1   Title Pt will be IND in HEP to improve strength, balance, and pain. TARGET DATE FOR ALL STGS: 04/04/20    Baseline met    Time 4    Period Weeks    Status Achieved      PT SHORT TERM GOAL #2   Title Pt will amb. 300', IND, over even indoor terrain to improve safety during amb. at home.    Time 4    Period Weeks    Status Achieved      PT SHORT TERM GOAL #3   Title Perform FGA and write goals as indicated.    Time 4    Period Weeks    Status Achieved      PT SHORT TERM GOAL #4   Title Pt will report back pain during ADLs', at worst,  is </= 7/10 to improve QOL and ability to perform ADLs safely.    Baseline 10/10 at worst at eval and pt reporting 6/10 on 04/04/20.    Time 4    Period Weeks    Status Achieved      PT SHORT TERM GOAL #5   Title Pt will improve FGA score to >/=17/30 to decr. falls risk.    Baseline 13/30 on eval, 17/30 on 04/04/20    Time 4    Period Weeks    Status Achieved             PT Long Term Goals - 04/11/20 1724      PT LONG TERM GOAL #1   Title Pt will amb. 500', IND, over even and paved outdoors surfaces in order to improve functional mobility. TARGET DATE FOR ALL LTGS: 04/25/20    Time 8    Period Weeks    Status New      PT LONG TERM GOAL #2   Title Pt will report LBP/hip pain at worst during ADLs is </=2/10 to improve QOL and perform ADLs safely.    Baseline 10/10    Time 8    Period Weeks    Status New      PT LONG TERM GOAL #3   Title Pt will report ability to stand for 30+ minutes without incr. in LBP in order to perform ADLs and improve QOL.    Baseline 10/10 at worst    Time 8    Period Weeks      Status New      PT LONG TERM GOAL #4   Title Pt will improve FGA score to >/=21/30 to decr. falls risk.    Baseline  13/30    Time 8    Period Weeks    Status New                 Plan - 04/16/20 1925    Clinical Impression Statement Pt continues to show progress towards all goals. She is showing improving balance with progressing to more narrow BOS activities. Pt is challenged most on compliant surfaces. Able to ambulate on level surfaces supervision without AD and supervision with cane on nonlevel at this time. Back pain has been less limiting with pt able to walk and further and tolerate more standing activities. Pt continues to benefit from skilled PT to continue to progress balance and functional mobility.    Personal Factors and Comorbidities Age;Comorbidity 3+;Time since onset of injury/illness/exacerbation;Past/Current Experience    Comorbidities Tx myelopathy, HTN, macular degeneration, B cataract extraction (2021), hypothyroidism years ago, anxiety, arthritis.    Examination-Activity Limitations Bend;Bathing;Locomotion Level;Transfers;Reach Overhead;Carry;Dressing;Stand;Stairs;Squat    Examination-Participation Restrictions Cleaning;Community Activity;Driving;Interpersonal Relationship;Laundry;Meal Prep    Stability/Clinical Decision Making Evolving/Moderate complexity    Rehab Potential Good    PT Frequency 2x / week    PT Duration 8 weeks    PT Treatment/Interventions ADLs/Self Care Home Management;Aquatic Therapy;Biofeedback;Electrical Stimulation;DME Instruction;Balance training;Therapeutic exercise;Therapeutic activities;Patient/family education;Functional mobility training;Stair training;Gait training;Neuromuscular re-education;Manual techniques;Vestibular;Orthotic Fit/Training    PT Next Visit Plan Continue with high level balance, esp compliant surfaces, EC and SLS, core strengthening, flexibility as needed    Consulted and Agree with Plan of Care Patient;Family member/caregiver    Family Member Consulted Rick: husband           Patient will benefit from skilled therapeutic  intervention in order to improve the following deficits and impairments:  Abnormal gait,Decreased coordination,Decreased range of motion,Decreased endurance,Decreased knowledge of use of DME,Decreased balance,Decreased mobility,Decreased strength,Postural dysfunction,Impaired flexibility,Pain  Visit Diagnosis: Other abnormalities of gait and mobility  Muscle weakness (generalized)  Unsteadiness on feet     Problem List Patient Active Problem List   Diagnosis Date Noted  . Neurogenic bladder 06/29/2018  . Essential hypertension   . Cervical spondylosis without myelopathy   . Steroid-induced hyperglycemia   . Postoperative pain   . Hyponatremia   . Leucocytosis   . Thoracic myelopathy 04/22/2018  . ALLERGIC RHINITIS 01/19/2007    Emily A Parker, PT, DPT, NCS 04/16/2020, 7:31 PM  [facility] [address] [place], [place], [address] Phone: [phone]   Fax:  [phone]  Name: Torii F Reinertsen MRN: [id number] Date of Birth: 10/07/1940   

## 2020-04-18 ENCOUNTER — Ambulatory Visit: Payer: Medicare PPO

## 2020-04-22 ENCOUNTER — Ambulatory Visit: Payer: Medicare PPO | Admitting: Rehabilitation

## 2020-04-25 ENCOUNTER — Ambulatory Visit: Payer: Medicare PPO

## 2020-04-25 ENCOUNTER — Other Ambulatory Visit: Payer: Self-pay

## 2020-04-25 DIAGNOSIS — M5442 Lumbago with sciatica, left side: Secondary | ICD-10-CM

## 2020-04-25 DIAGNOSIS — R2689 Other abnormalities of gait and mobility: Secondary | ICD-10-CM | POA: Diagnosis not present

## 2020-04-25 DIAGNOSIS — R2681 Unsteadiness on feet: Secondary | ICD-10-CM

## 2020-04-25 DIAGNOSIS — M6281 Muscle weakness (generalized): Secondary | ICD-10-CM

## 2020-04-25 NOTE — Therapy (Signed)
[facility] [address] [place], [place], [address] Phone: [phone]   Fax:  [phone]  Physical Therapy Treatment  Patient Details  Name: Cheryl Pearson MRN: [id number] Date of Birth: May 07, 1940 Referring Provider (PT): Dr. Consuella Lose   Encounter Date: 04/25/2020   PT End of Session - 04/25/20 1410    Visit Number 11    Number of Visits 17    Date for PT Re-Evaluation 06/05/20    Authorization Type [employer] medicare: auth 03/07/20-06/05/20    Progress Note Due on Visit 10    PT Start Time 6384    PT Stop Time 1358    PT Time Calculation (min) 41 min    Equipment Utilized During Treatment Gait belt    Activity Tolerance Patient tolerated treatment well    Behavior During Therapy [facility] for tasks assessed/performed           Past Medical History:  Diagnosis Date   Anxiety    Arthritis    Colitis    Early age-related macular degeneration    Hernia    Hypertension    Infertility, female    Menopausal symptoms    Rosacea    Thyroid disease 1968   hypothyroidism treated with meds for short time then all labs normal    Past Surgical History:  Procedure Laterality Date   bone spur Left    COLONOSCOPY  11/2011   polyp, ulcerative coliits inactive   COLONOSCOPY  2005   COLONOSCOPY  1970's   diagnosed with ulcerative colitis   ESOPHAGOGASTRODUODENOSCOPY (EGD) WITH PROPOFOL N/A 11/20/2014   Procedure: ESOPHAGOGASTRODUODENOSCOPY (EGD) WITH PROPOFOL;  Surgeon: Laurence Spates, MD;  Location: [facility];  Service: Endoscopy;  Laterality: N/A;   INGUINAL HERNIA REPAIR Right 70 mos old   LAPAROSCOPIC CHOLECYSTECTOMY  09/1998   SAVORY DILATION N/A 11/20/2014   Procedure: SAVORY DILATION;  Surgeon: Laurence Spates, MD;  Location: [facility];  Service: Endoscopy;  Laterality: N/A;   WISDOM TOOTH EXTRACTION  1968    There were no vitals filed for this visit.   Subjective Assessment - 04/25/20  1321    Subjective Pt denied changes or falls since last visit. Pt hasn't been practicing balance HEP. Pt reported she was able to walk forward and backwards on sand over Christmas.    Patient is accompained by: Family member   Rick: husband   Pertinent History Tx myelopathy, HTN, macular degeneration, B cataract extraction (2021), hypothyroidism years ago, anxiety, arthritis.    Patient Stated Goals To decr. pain    Currently in Pain? No/denies                             [facility] Adult PT Treatment/Exercise - 04/25/20 1342      Ambulation/Gait   Ambulation/Gait Yes    Ambulation/Gait Assistance 5: Supervision    Ambulation/Gait Assistance Details Cues to improve reciprocal arm swing and Pearson heel strike.    Ambulation Distance (Feet) 230 Feet   x4   Assistive device None    Gait Pattern Step-through pattern;Decreased step length - right;Decreased step length - left    Ambulation Surface Level;Indoor    Gait Comments SpO2: 98% and HR: 108bpm after amb. Pt reported slight SOB with SpO2 decr. to 96% with amb.      Knee/Hip Exercises: Supine   Other Supine Knee/Hip Exercises With green physioball: 3x10 reps of DKTC and x10 reps bridges. Cues for proper technique  and to continue breathing through exercise.         Bridges limited to small range to not place lumbar spine in ext.       Balance Exercises - 04/25/20 1341      Balance Exercises: Standing   Standing Eyes Opened Narrow base of support (BOS);Wide (BOA);Foam/compliant surface;3 reps   10-30 sec holds   Standing Eyes Closed Narrow base of support (BOS);Wide (BOA);Foam/compliant surface;3 reps;10 secs    Other Standing Exercises Standing in //bars with min A to S for safety. Pt also performed single and double cone taps with BLE x10 reps/activity.               PT Short Term Goals - 04/08/20 1510      PT SHORT TERM GOAL #1   Title Pt will be IND in HEP to improve strength, balance, and pain. TARGET DATE  FOR ALL STGS: 04/04/20    Baseline met    Time 4    Period Weeks    Status Achieved      PT SHORT TERM GOAL #2   Title Pt will amb. 300', IND, over even indoor terrain to improve safety during amb. at home.    Time 4    Period Weeks    Status Achieved      PT SHORT TERM GOAL #3   Title Perform FGA and write goals as indicated.    Time 4    Period Weeks    Status Achieved      PT SHORT TERM GOAL #4   Title Pt will report back pain during ADLs', at worst,  is </= 7/10 to improve QOL and ability to perform ADLs safely.    Baseline 10/10 at worst at eval and pt reporting 6/10 on 04/04/20.    Time 4    Period Weeks    Status Achieved      PT SHORT TERM GOAL #5   Title Pt will improve FGA score to >/=17/30 to decr. falls risk.    Baseline 13/30 on eval, 17/30 on 04/04/20    Time 4    Period Weeks    Status Achieved             PT Long Term Goals - 04/25/20 1413      PT LONG TERM GOAL #1   Title Pt will amb. 500', IND, over even and paved outdoors surfaces in order to improve functional mobility. TARGET DATE FOR ALL LTGS: 05/02/20    Time 8    Period Weeks    Status New      PT LONG TERM GOAL #2   Title Pt will report LBP/hip pain at worst during ADLs is </=2/10 to improve QOL and perform ADLs safely.    Baseline 10/10    Time 8    Period Weeks    Status New      PT LONG TERM GOAL #3   Title Pt will report ability to stand for 30+ minutes without incr. in LBP in order to perform ADLs and improve QOL.    Baseline 10/10 at worst    Time 8    Period Weeks    Status New      PT LONG TERM GOAL #4   Title Pt will improve FGA score to >/=21/30 to decr. falls risk.    Baseline 13/30    Time 8    Period Weeks    Status New  Plan - 04/25/20 1411    Clinical Impression Statement Pt demonstrated progress as she was able to amb. today without AD, for longer distances with less gait deviations. Pt was noted to experience decr. Pearson heel strike, narrow BOS  during turns and towards end of session 2/2 fatigue. Pt required seated rest breaks after each bout of amb. 2/2 SOB. Pt continues to experience incr .postural sway during activities which require incr. input from vestibular system. Pt would continue to benefit from skilled PT to improve safety during functional mobility. PT updated pt's LTG date to 05/03/19, as incorrect date of 04/25/20 was initially added.    Personal Factors and Comorbidities Age;Comorbidity 3+;Time since onset of injury/illness/exacerbation;Past/Current Experience    Comorbidities Tx myelopathy, HTN, macular degeneration, B cataract extraction (2021), hypothyroidism years ago, anxiety, arthritis.    Examination-Activity Limitations Bend;Bathing;Locomotion Level;Transfers;Reach Overhead;Carry;Dressing;Stand;Stairs;Squat    Examination-Participation Restrictions Cleaning;Community Activity;Driving;Interpersonal Relationship;Laundry;Meal Prep    Stability/Clinical Decision Making Evolving/Moderate complexity    Rehab Potential Good    PT Frequency 2x / week    PT Duration 8 weeks    PT Treatment/Interventions ADLs/Self Care Home Management;Aquatic Therapy;Biofeedback;Electrical Stimulation;DME Instruction;Balance training;Therapeutic exercise;Therapeutic activities;Patient/family education;Functional mobility training;Stair training;Gait training;Neuromuscular re-education;Manual techniques;Vestibular;Orthotic Fit/Training    PT Next Visit Plan Check LTGs on 05/03/19. Continue with high level balance, esp compliant surfaces, EC and SLS, core strengthening, flexibility as needed    Consulted and Agree with Plan of Care Patient;Family member/caregiver           Patient will benefit from skilled therapeutic intervention in order to improve the following deficits and impairments:  Abnormal gait,Decreased coordination,Decreased range of motion,Decreased endurance,Decreased knowledge of use of DME,Decreased balance,Decreased  mobility,Decreased strength,Postural dysfunction,Impaired flexibility,Pain  Visit Diagnosis: Other abnormalities of gait and mobility  Muscle weakness (generalized)  Unsteadiness on feet  Chronic midline low back pain with bilateral sciatica     Problem List Patient Active Problem List   Diagnosis Date Noted   Neurogenic bladder 06/29/2018   Essential hypertension    Cervical spondylosis without myelopathy    Steroid-induced hyperglycemia    Postoperative pain    Hyponatremia    Leucocytosis    Thoracic myelopathy 04/22/2018   ALLERGIC RHINITIS 01/19/2007    Cheryl Pearson 04/25/2020, 2:16 PM  [facility] [address] [place], [place], [address] Phone: [phone]   Fax:  [phone]  Name: Cheryl Pearson MRN: [id number] Date of Birth: 20-Dec-1940  Geoffry Paradise, PT,DPT 04/25/20 2:16 PM Phone: [phone] Fax: [phone]

## 2020-04-30 ENCOUNTER — Ambulatory Visit: Payer: Medicare PPO | Attending: Neurosurgery

## 2020-04-30 ENCOUNTER — Other Ambulatory Visit: Payer: Self-pay

## 2020-04-30 VITALS — HR 84

## 2020-04-30 DIAGNOSIS — H26491 Other secondary cataract, right eye: Secondary | ICD-10-CM | POA: Diagnosis not present

## 2020-04-30 DIAGNOSIS — R2689 Other abnormalities of gait and mobility: Secondary | ICD-10-CM | POA: Insufficient documentation

## 2020-04-30 DIAGNOSIS — M6281 Muscle weakness (generalized): Secondary | ICD-10-CM | POA: Diagnosis not present

## 2020-04-30 DIAGNOSIS — R2681 Unsteadiness on feet: Secondary | ICD-10-CM | POA: Insufficient documentation

## 2020-04-30 DIAGNOSIS — M5442 Lumbago with sciatica, left side: Secondary | ICD-10-CM | POA: Diagnosis not present

## 2020-04-30 DIAGNOSIS — M5441 Lumbago with sciatica, right side: Secondary | ICD-10-CM | POA: Diagnosis not present

## 2020-04-30 DIAGNOSIS — G8929 Other chronic pain: Secondary | ICD-10-CM | POA: Diagnosis not present

## 2020-04-30 NOTE — Therapy (Signed)
[name] [address] [place], [place], [address] Phone: [phone]   Fax:  [phone]  Physical Therapy Treatment  Patient Details  Name: Cheryl Pearson MRN: [id number] Date of Birth: 1940/06/06 Referring Provider (PT): Dr. Consuella Lose   Encounter Date: 04/30/2020   PT End of Session - 04/30/20 1317    Visit Number 12    Number of Visits 17    Date for PT Re-Evaluation 06/05/20    Authorization Type [employer]: auth 03/07/20-06/05/20    Progress Note Due on Visit 10    PT Start Time 1315    PT Stop Time 1358    PT Time Calculation (min) 43 min    Equipment Utilized During Treatment Gait belt    Activity Tolerance Patient tolerated treatment well    Behavior During Therapy [facility] for tasks assessed/performed           Past Medical History:  Diagnosis Date  . Anxiety   . Arthritis   . Colitis   . Early age-related macular degeneration   . Hernia   . Hypertension   . Infertility, female   . Menopausal symptoms   . Rosacea   . Thyroid disease 1968   hypothyroidism treated with meds for short time then all labs normal    Past Surgical History:  Procedure Laterality Date  . bone spur Left   . COLONOSCOPY  11/2011   polyp, ulcerative coliits inactive  . COLONOSCOPY  2005  . COLONOSCOPY  1970's   diagnosed with ulcerative colitis  . ESOPHAGOGASTRODUODENOSCOPY (EGD) WITH PROPOFOL N/A 11/20/2014   Procedure: ESOPHAGOGASTRODUODENOSCOPY (EGD) WITH PROPOFOL;  Surgeon: Laurence Spates, MD;  Location: [facility];  Service: Endoscopy;  Laterality: N/A;  . INGUINAL HERNIA REPAIR Right 43 mos old  . LAPAROSCOPIC CHOLECYSTECTOMY  09/1998  . SAVORY DILATION N/A 11/20/2014   Procedure: SAVORY DILATION;  Surgeon: Laurence Spates, MD;  Location: [facility];  Service: Endoscopy;  Laterality: N/A;  . WISDOM TOOTH EXTRACTION  1968    Vitals:   04/30/20 1318  Pulse: 84     Subjective Assessment - 04/30/20 1318     Subjective Pt reports that she is doing well. Took her Advil so back doing pretty good.    Patient is accompained by: Family member   Rick: husband   Pertinent History Tx myelopathy, HTN, macular degeneration, B cataract extraction (2021), hypothyroidism years ago, anxiety, arthritis.    Patient Stated Goals To decr. pain    Currently in Pain? Yes    Pain Score --   0.5   Pain Location Back    Pain Orientation Lower    Pain Descriptors / Indicators Aching    Pain Type Chronic pain    Pain Onset More than a month ago    Pain Frequency Intermittent    Aggravating Factors  certain movements especially when first gets up in morning.    Pain Relieving Factors advil and getting up and moving.                             [facility] Adult PT Treatment/Exercise - 04/30/20 1323      Ambulation/Gait   Ambulation/Gait Yes    Ambulation/Gait Assistance 5: Supervision    Ambulation/Gait Assistance Details HR=100, RPE=2/10 after. Pt reported a little SOB from trying to work on upright posture as she walked as well.    Ambulation Distance (Feet) 920 Feet    Assistive  device None    Gait Pattern Step-through pattern;Decreased arm swing - right;Decreased arm swing - left    Ambulation Surface Level;Indoor      Neuro Re-ed    Neuro Re-ed Details  In // bars: marching gait 8' x 6, tandem gait 8' x 8 with fingertip support on one hand. Step-ups on soft blue foam x 10 without UE support getting balance each time. Standing on soft blue foam beam x 30 sec eyes open. Standing with feet together x 30sec eyes open, 30 sec eyes closed with increased sway CGA, feet together with head turns left/right x 10 and up/down x 10. SBA/CGA for safety.      Knee/Hip Exercises: Aerobic   Other Aerobic Sci Fit legs only level 3 x 5 min. No pain in legs after just felt like that did something.                    PT Short Term Goals - 04/08/20 1510      PT SHORT TERM GOAL #1   Title Pt will be  IND in HEP to improve strength, balance, and pain. TARGET DATE FOR ALL STGS: 04/04/20    Baseline met    Time 4    Period Weeks    Status Achieved      PT SHORT TERM GOAL #2   Title Pt will amb. 300', IND, over even indoor terrain to improve safety during amb. at home.    Time 4    Period Weeks    Status Achieved      PT SHORT TERM GOAL #3   Title Perform FGA and write goals as indicated.    Time 4    Period Weeks    Status Achieved      PT SHORT TERM GOAL #4   Title Pt will report back pain during ADLs', at worst,  is </= 7/10 to improve QOL and ability to perform ADLs safely.    Baseline 10/10 at worst at eval and pt reporting 6/10 on 04/04/20.    Time 4    Period Weeks    Status Achieved      PT SHORT TERM GOAL #5   Title Pt will improve FGA score to >/=17/30 to decr. falls risk.    Baseline 13/30 on eval, 17/30 on 04/04/20    Time 4    Period Weeks    Status Achieved             PT Long Term Goals - 04/25/20 1413      PT LONG TERM GOAL #1   Title Pt will amb. 500', IND, over even and paved outdoors surfaces in order to improve functional mobility. TARGET DATE FOR ALL LTGS: 05/02/20    Time 8    Period Weeks    Status New      PT LONG TERM GOAL #2   Title Pt will report LBP/hip pain at worst during ADLs is </=2/10 to improve QOL and perform ADLs safely.    Baseline 10/10    Time 8    Period Weeks    Status New      PT LONG TERM GOAL #3   Title Pt will report ability to stand for 30+ minutes without incr. in LBP in order to perform ADLs and improve QOL.    Baseline 10/10 at worst    Time 8    Period Weeks    Status New      PT LONG TERM  GOAL #4   Title Pt will improve FGA score to >/=21/30 to decr. falls risk.    Baseline 13/30    Time 8    Period Weeks    Status New                 Plan - 04/30/20 1828    Clinical Impression Statement Pt able to increase gait distance today with good vital response. No significant reports of back/leg pain  during session but reports still having issues in the morning.    Personal Factors and Comorbidities Age;Comorbidity 3+;Time since onset of injury/illness/exacerbation;Past/Current Experience    Comorbidities Tx myelopathy, HTN, macular degeneration, B cataract extraction (2021), hypothyroidism years ago, anxiety, arthritis.    Examination-Activity Limitations Bend;Bathing;Locomotion Level;Transfers;Reach Overhead;Carry;Dressing;Stand;Stairs;Squat    Examination-Participation Restrictions Cleaning;Community Activity;Driving;Interpersonal Relationship;Laundry;Meal Prep    Stability/Clinical Decision Making Evolving/Moderate complexity    Rehab Potential Good    PT Frequency 2x / week    PT Duration 8 weeks    PT Treatment/Interventions ADLs/Self Care Home Management;Aquatic Therapy;Biofeedback;Electrical Stimulation;DME Instruction;Balance training;Therapeutic exercise;Therapeutic activities;Patient/family education;Functional mobility training;Stair training;Gait training;Neuromuscular re-education;Manual techniques;Vestibular;Orthotic Fit/Training    PT Next Visit Plan Check LTGs on 3/7/90WI determine recert versus d/c. Continue with high level balance, esp compliant surfaces, EC and SLS, core strengthening, flexibility as needed    Consulted and Agree with Plan of Care Patient;Family member/caregiver           Patient will benefit from skilled therapeutic intervention in order to improve the following deficits and impairments:  Abnormal gait,Decreased coordination,Decreased range of motion,Decreased endurance,Decreased knowledge of use of DME,Decreased balance,Decreased mobility,Decreased strength,Postural dysfunction,Impaired flexibility,Pain  Visit Diagnosis: Other abnormalities of gait and mobility  Muscle weakness (generalized)     Problem List Patient Active Problem List   Diagnosis Date Noted  . Neurogenic bladder 06/29/2018  . Essential hypertension   . Cervical spondylosis  without myelopathy   . Steroid-induced hyperglycemia   . Postoperative pain   . Hyponatremia   . Leucocytosis   . Thoracic myelopathy 04/22/2018  . ALLERGIC RHINITIS 01/19/2007    Electa Sniff, PT, DPT, NCS 04/30/2020, 6:30 PM  [facility] [address] [place], [place], [address] Phone: [phone]   Fax:  [phone]  Name: ZADIA UHDE MRN: [id number] Date of Birth: May 01, 1940

## 2020-05-02 ENCOUNTER — Ambulatory Visit: Payer: Medicare PPO

## 2020-05-02 ENCOUNTER — Other Ambulatory Visit: Payer: Self-pay

## 2020-05-02 DIAGNOSIS — M5441 Lumbago with sciatica, right side: Secondary | ICD-10-CM | POA: Diagnosis not present

## 2020-05-02 DIAGNOSIS — R2681 Unsteadiness on feet: Secondary | ICD-10-CM | POA: Diagnosis not present

## 2020-05-02 DIAGNOSIS — G8929 Other chronic pain: Secondary | ICD-10-CM | POA: Diagnosis not present

## 2020-05-02 DIAGNOSIS — M6281 Muscle weakness (generalized): Secondary | ICD-10-CM | POA: Diagnosis not present

## 2020-05-02 DIAGNOSIS — R2689 Other abnormalities of gait and mobility: Secondary | ICD-10-CM

## 2020-05-02 DIAGNOSIS — M5442 Lumbago with sciatica, left side: Secondary | ICD-10-CM | POA: Diagnosis not present

## 2020-05-02 NOTE — Therapy (Signed)
[facility] [address] [place], [place], [address] Phone: [phone]   Fax:  [phone]  Physical Therapy Treatment/Recert  Patient Details  Name: Cheryl Pearson MRN: [id number] Date of Birth: [birth year] Referring Provider (PT): Dr. Consuella Lose   Encounter Date: 05/02/2020   PT End of Session - 05/02/20 1316    Visit Number 13    Number of Visits 19    Date for PT Re-Evaluation 07/01/20   30 day poc, 60 day cert   Authorization Type [employer] medicare: auth 03/07/20-06/05/20    Progress Note Due on Visit 10    PT Start Time 1315    PT Stop Time 1403    PT Time Calculation (min) 48 min    Equipment Utilized During Treatment Gait belt    Activity Tolerance Patient tolerated treatment well    Behavior During Therapy [facility] for tasks assessed/performed           Past Medical History:  Diagnosis Date  . Anxiety   . Arthritis   . Colitis   . Early age-related macular degeneration   . Hernia   . Hypertension   . Infertility, female   . Menopausal symptoms   . Rosacea   . Thyroid disease 1968   hypothyroidism treated with meds for short time then all labs normal    Past Surgical History:  Procedure Laterality Date  . bone spur Left   . COLONOSCOPY  11/2011   polyp, ulcerative coliits inactive  . COLONOSCOPY  2005  . COLONOSCOPY  1970's   diagnosed with ulcerative colitis  . ESOPHAGOGASTRODUODENOSCOPY (EGD) WITH PROPOFOL N/A 11/20/2014   Procedure: ESOPHAGOGASTRODUODENOSCOPY (EGD) WITH PROPOFOL;  Surgeon: Laurence Spates, MD;  Location: [facility];  Service: Endoscopy;  Laterality: N/A;  . INGUINAL HERNIA REPAIR Right 47 mos old  . LAPAROSCOPIC CHOLECYSTECTOMY  09/1998  . SAVORY DILATION N/A 11/20/2014   Procedure: SAVORY DILATION;  Surgeon: Laurence Spates, MD;  Location: [facility];  Service: Endoscopy;  Laterality: N/A;  . WISDOM TOOTH EXTRACTION  1968    There were no vitals filed for this visit.    Subjective Assessment - 05/02/20 1316    Subjective Pt reports that back still gives her issues earlier in the day. Has trouble straightening up. Pain 5/10 in mornings. Doing much better now that she has gotten moving and taken her Advil.    Patient is accompained by: Family member   Rick: husband   Pertinent History Tx myelopathy, HTN, macular degeneration, B cataract extraction (2021), hypothyroidism years ago, anxiety, arthritis.    Patient Stated Goals To decr. pain    Currently in Pain? No/denies    Pain Onset More than a month ago              [facility] PT Assessment - 05/02/20 1317      Assessment   Medical Diagnosis Unsteadiness on feet    Referring Provider (PT) Dr. Consuella Lose    Onset Date/Surgical Date 07/06/19      Functional Gait  Assessment   Gait assessed  Yes    Gait Level Surface Walks 20 ft in less than 7 sec but greater than 5.5 sec, uses assistive device, slower speed, mild gait deviations, or deviates 6-10 in outside of the 12 in walkway width.    Change in Gait Speed Able to smoothly change walking speed without loss of balance or gait deviation. Deviate no more than 6 in outside of the 12 in walkway width.  Gait with Horizontal Head Turns Performs head turns smoothly with slight change in gait velocity (eg, minor disruption to smooth gait path), deviates 6-10 in outside 12 in walkway width, or uses an assistive device.    Gait with Vertical Head Turns Performs head turns with no change in gait. Deviates no more than 6 in outside 12 in walkway width.    Gait and Pivot Turn Pivot turns safely within 3 sec and stops quickly with no loss of balance.    Step Over Obstacle Is able to step over 2 stacked shoe boxes taped together (9 in total height) without changing gait speed. No evidence of imbalance.    Gait with Narrow Base of Support Ambulates 7-9 steps.    Gait with Eyes Closed Walks 20 ft, uses assistive device, slower speed, mild gait deviations, deviates  6-10 in outside 12 in walkway width. Ambulates 20 ft in less than 9 sec but greater than 7 sec.    Ambulating Backwards Walks 20 ft, no assistive devices, good speed, no evidence for imbalance, normal gait    Steps Alternating feet, must use rail.    Total Score 25                         [facility] Adult PT Treatment/Exercise - 05/02/20 1317      Transfers   Comments 30 sec sit to stand=6 from chair without hands.      Ambulation/Gait   Ambulation/Gait Yes    Ambulation/Gait Assistance 6: Modified independent (Device/Increase time);5: Supervision    Ambulation/Gait Assistance Details Pt reported 3/10 RPE after feeling a little out of breath. Was able to carry on conversation throughout with PT. Does stagger slightly to the right at times but able to self correct. Supervision over grass.    Ambulation Distance (Feet) 850 Feet    Assistive device None    Gait Pattern Step-through pattern    Ambulation Surface Level;Indoor      Therapeutic Activites    Therapeutic Activities Other Therapeutic Activities    Other Therapeutic Activities PT discussed with pt ways to protect back with activities: standing at sink keeping knees slightly bent, avoiding repetitive bending/lifting/twisting.                  PT Education - 05/02/20 1905    Education Details Discussed recert plan to continue to focus on core strengthening, proper body mechanics, pain management. Discussed results of testing.    Person(s) Educated Patient;Spouse    Methods Explanation    Comprehension Verbalized understanding            PT Short Term Goals - 04/08/20 1510      PT SHORT TERM GOAL #1   Title Pt will be IND in HEP to improve strength, balance, and pain. TARGET DATE FOR ALL STGS: 04/04/20    Baseline met    Time 4    Period Weeks    Status Achieved      PT SHORT TERM GOAL #2   Title Pt will amb. 300', IND, over even indoor terrain to improve safety during amb. at home.    Time 4     Period Weeks    Status Achieved      PT SHORT TERM GOAL #3   Title Perform FGA and write goals as indicated.    Time 4    Period Weeks    Status Achieved      PT SHORT TERM GOAL #4  Title Pt will report back pain during ADLs', at worst,  is </= 7/10 to improve QOL and ability to perform ADLs safely.    Baseline 10/10 at worst at eval and pt reporting 6/10 on 04/04/20.    Time 4    Period Weeks    Status Achieved      PT SHORT TERM GOAL #5   Title Pt will improve FGA score to >/=17/30 to decr. falls risk.    Baseline 13/30 on eval, 17/30 on 04/04/20    Time 4    Period Weeks    Status Achieved             PT Long Term Goals - 05/02/20 1339      PT LONG TERM GOAL #1   Title Pt will amb. 500', IND, over even and paved outdoors surfaces in order to improve functional mobility. TARGET DATE FOR ALL LTGS: 05/02/20    Baseline Pt is able to walk >500' without AD on level surfaces mod I and on nonlevel supervision. She is mod I with use of cane on grassy surfaces and does bring cane with her.    Time 8    Period Weeks    Status Achieved      PT LONG TERM GOAL #2   Title Pt will report LBP/hip pain at worst during ADLs is </=2/10 to improve QOL and perform ADLs safely.    Baseline 10/10. 05/02/20 Pt reports that pain varies during the day. Earlier in day pain up to 5/10. Later in day she doesn't have much pain at all.    Time 8    Period Weeks    Status Not Met      PT LONG TERM GOAL #3   Title Pt will report ability to stand for 30+ minutes without incr. in LBP in order to perform ADLs and improve QOL.    Baseline 10/10 at worst. 05/02/20 Pt reports being able to stand about 15 minutes or less before increased back pain depending on time of day.    Time 8    Period Weeks    Status Not Met      PT LONG TERM GOAL #4   Title Pt will improve FGA score to >/=21/30 to decr. falls risk.    Baseline 13/30. 05/02/20 25/30    Time 8    Period Weeks    Status Achieved            Updated PT goals:  PT Short Term Goals - 05/02/20 1915      PT SHORT TERM GOAL #1   Title STGs=LTGs           PT Long Term Goals - 05/02/20 1915      PT LONG TERM GOAL #1   Title Pt will be independent with progressive HEP for balance and core stabilization to continue gains on own.    Time 4    Period Weeks    Status New    Target Date 06/01/20      PT LONG TERM GOAL #2   Title Pt will report LBP/hip pain at worst during ADLs is </=3/10 to improve QOL and perform ADLs safely.    Baseline 10/10. 05/02/20 Pt reports that pain varies during the day. Earlier in day pain up to 5/10. Later in day she doesn't have much pain at all.    Time 4    Period Weeks    Status Revised    Target Date 06/01/20  PT LONG TERM GOAL #3   Title Pt will be instructed in ways to better manage her back pain to improve function and verbalize understanding.    Time 4    Period Weeks    Status New    Target Date 06/01/20      PT LONG TERM GOAL #4   Title Pt will improve FGA score to >/=27/30 to decr. falls risk.    Baseline 13/30. 05/02/20 25/30    Time 4    Period Weeks    Status Revised    Target Date 06/01/20                Plan - 05/02/20 1908    Clinical Impression Statement PT has met gait goal and FGA goal with increase to 25/30 showing lower fall risk. Pt's back pain has improved some with highest pain rated 5/10 now in early part of day. No pain during session but back/hip pain does continue to affect her daily activities. Pt will benefit from continued PT to continue to work on higher level balance and pain management to improve her function.    Personal Factors and Comorbidities Age;Comorbidity 3+;Time since onset of injury/illness/exacerbation;Past/Current Experience    Comorbidities Tx myelopathy, HTN, macular degeneration, B cataract extraction (2021), hypothyroidism years ago, anxiety, arthritis.    Examination-Activity Limitations Bend;Bathing;Locomotion  Level;Transfers;Reach Overhead;Carry;Dressing;Stand;Stairs;Squat    Examination-Participation Restrictions Cleaning;Community Activity;Driving;Interpersonal Relationship;Laundry;Meal Prep    Stability/Clinical Decision Making Evolving/Moderate complexity    Rehab Potential Good    PT Frequency 2x / week   followed by 1x/week for 2 weeks.   PT Duration 2 weeks    PT Treatment/Interventions ADLs/Self Care Home Management;Aquatic Therapy;Biofeedback;Electrical Stimulation;DME Instruction;Balance training;Therapeutic exercise;Therapeutic activities;Patient/family education;Functional mobility training;Stair training;Gait training;Neuromuscular re-education;Manual techniques;Vestibular;Orthotic Fit/Training    PT Next Visit Plan Recert to focus more on decreasing back/hip pain. Focus on core stabilization progression, neutral spine due to stenosis. Proper body mechanics to protect back. Pain is mostly early in day and very limiting. Continue with high level balance, esp compliant surfaces, EC and SLS.    Consulted and Agree with Plan of Care Patient;Family member/caregiver           Patient will benefit from skilled therapeutic intervention in order to improve the following deficits and impairments:  Abnormal gait,Decreased coordination,Decreased range of motion,Decreased endurance,Decreased knowledge of use of DME,Decreased balance,Decreased mobility,Decreased strength,Postural dysfunction,Impaired flexibility,Pain  Visit Diagnosis: Other abnormalities of gait and mobility  Muscle weakness (generalized)  Chronic midline low back pain with bilateral sciatica  Unsteadiness on feet     Problem List Patient Active Problem List   Diagnosis Date Noted  . Neurogenic bladder 06/29/2018  . Essential hypertension   . Cervical spondylosis without myelopathy   . Steroid-induced hyperglycemia   . Postoperative pain   . Hyponatremia   . Leucocytosis   . Thoracic myelopathy 04/22/2018  . ALLERGIC  RHINITIS 01/19/2007    Electa Sniff, PT, DPT, NCS 05/02/2020, 7:15 PM  [facility] [address] [place], [place], [address] Phone: [phone]   Fax:  [phone]  Name: Cheryl Pearson MRN: [id number] Date of Birth: 09-17-1940

## 2020-05-07 ENCOUNTER — Ambulatory Visit: Payer: Medicare PPO

## 2020-05-07 ENCOUNTER — Other Ambulatory Visit: Payer: Self-pay

## 2020-05-07 DIAGNOSIS — R2681 Unsteadiness on feet: Secondary | ICD-10-CM | POA: Diagnosis not present

## 2020-05-07 DIAGNOSIS — M6281 Muscle weakness (generalized): Secondary | ICD-10-CM | POA: Diagnosis not present

## 2020-05-07 DIAGNOSIS — M5442 Lumbago with sciatica, left side: Secondary | ICD-10-CM | POA: Diagnosis not present

## 2020-05-07 DIAGNOSIS — M5441 Lumbago with sciatica, right side: Secondary | ICD-10-CM | POA: Diagnosis not present

## 2020-05-07 DIAGNOSIS — G8929 Other chronic pain: Secondary | ICD-10-CM | POA: Diagnosis not present

## 2020-05-07 DIAGNOSIS — R2689 Other abnormalities of gait and mobility: Secondary | ICD-10-CM

## 2020-05-07 NOTE — Therapy (Signed)
[facility] [address] [place], [place], [address] Phone: [phone]   Fax:  [phone]  Physical Therapy Treatment  Patient Details  Name: Cheryl Pearson MRN: [id number] Date of Birth: [birth year] Referring Provider (PT): Dr. Lisbeth Renshaw   Encounter Date: 05/07/2020   PT End of Session - 05/07/20 1320    Visit Number 14    Number of Visits 19    Date for PT Re-Evaluation 07/01/20   30 day poc, 60 day cert   Authorization Type [employer]: auth 03/07/20-06/05/20    Progress Note Due on Visit 10    PT Start Time 1318    PT Stop Time 1358    PT Time Calculation (min) 40 min    Equipment Utilized During Treatment Gait belt    Activity Tolerance Patient tolerated treatment well    Behavior During Therapy [facility] for tasks assessed/performed           Past Medical History:  Diagnosis Date  . Anxiety   . Arthritis   . Colitis   . Early age-related macular degeneration   . Hernia   . Hypertension   . Infertility, female   . Menopausal symptoms   . Rosacea   . Thyroid disease 1968   hypothyroidism treated with meds for short time then all labs normal    Past Surgical History:  Procedure Laterality Date  . bone spur Left   . COLONOSCOPY  11/2011   polyp, ulcerative coliits inactive  . COLONOSCOPY  2005  . COLONOSCOPY  1970's   diagnosed with ulcerative colitis  . ESOPHAGOGASTRODUODENOSCOPY (EGD) WITH PROPOFOL N/A 11/20/2014   Procedure: ESOPHAGOGASTRODUODENOSCOPY (EGD) WITH PROPOFOL;  Surgeon: Carman Ching, MD;  Location: [facility];  Service: Endoscopy;  Laterality: N/A;  . INGUINAL HERNIA REPAIR Right 48 mos old  . LAPAROSCOPIC CHOLECYSTECTOMY  09/1998  . SAVORY DILATION N/A 11/20/2014   Procedure: SAVORY DILATION;  Surgeon: Carman Ching, MD;  Location: [facility];  Service: Endoscopy;  Laterality: N/A;  . WISDOM TOOTH EXTRACTION  1968    There were no vitals filed for this visit.    Subjective Assessment - 05/07/20 1321    Subjective Pt reports that back is still bothering her more in the morning but doing well later on in day. Wants to be able to get up off floor easier when does exercises on mat.    Patient is accompained by: Family member   Rick: husband   Pertinent History Tx myelopathy, HTN, macular degeneration, B cataract extraction (2021), hypothyroidism years ago, anxiety, arthritis.    Patient Stated Goals To decr. pain    Currently in Pain? No/denies    Pain Onset More than a month ago                             [facility] Adult PT Treatment/Exercise - 05/07/20 1322      Ambulation/Gait   Ambulation/Gait Yes    Ambulation/Gait Assistance 5: Supervision    Ambulation/Gait Assistance Details Pt stepped over 8" hurdle each lap around having to pause to prepare for step and unsteady with stepping over. Steps smaller today. Verbal cues to try to relax arms and increase step length.    Ambulation Distance (Feet) 345 Feet    Assistive device None    Gait Pattern Step-through pattern;Decreased step length - right;Decreased step length - left;Decreased arm swing - right;Decreased arm swing - left    Ambulation Surface  Level;Indoor    Curb 5: Supervision    Curb Details (indicate cue type and reason) performed x 6 bouts with performing with each leg.    Gait Comments Pt performed stepping over 8" hurdle x 6 supervision/CGA. Pt was able to get step smoother as went on but nervous at times.      Therapeutic Activites    Therapeutic Activities Other Therapeutic Activities    Other Therapeutic Activities Practiced floor to standing transfer with red mat on floor: started with coming down to mat with reaching down to floor and getting on knees supervision then performed the opposite to come back up with hands on floor and once feet under walking hands up legs to stand close SBA/CGA. Pt unsteady at times. Repeated with trialing getting in half kneeling but pt  did not have enough balance to maintain this position well to even think about pushing up to stand from here with hands on leg. In half tall kneeling performed trying to maintain position without hand support then added in alternating shoulder flexion x 10 with PT stabilizing at pelvis. Instructed to tighten tummy to help stabilize. Performed in each position. Then performed floor to standing transfer with hands on mat from half tall kneeling using RLE to push up x 3. Pt instructed that she can work on this from couch or chair at home for functional strengthening. Advised to put her mat next to couch to make getting up safer and easier.                  PT Education - 05/07/20 1412    Education Details floor transfer    Person(s) Educated Patient;Spouse    Methods Explanation;Demonstration    Comprehension Verbalized understanding;Returned demonstration            PT Short Term Goals - 05/02/20 1915      PT SHORT TERM GOAL #1   Title STGs=LTGs             PT Long Term Goals - 05/02/20 1915      PT LONG TERM GOAL #1   Title Pt will be independent with progressive HEP for balance and core stabilization to continue gains on own.    Time 4    Period Weeks    Status New    Target Date 06/01/20      PT LONG TERM GOAL #2   Title Pt will report LBP/hip pain at worst during ADLs is </=3/10 to improve QOL and perform ADLs safely.    Baseline 10/10. 05/02/20 Pt reports that pain varies during the day. Earlier in day pain up to 5/10. Later in day she doesn't have much pain at all.    Time 4    Period Weeks    Status Revised    Target Date 06/01/20      PT LONG TERM GOAL #3   Title Pt will be instructed in ways to better manage her back pain to improve function and verbalize understanding.    Time 4    Period Weeks    Status New    Target Date 06/01/20      PT LONG TERM GOAL #4   Title Pt will improve FGA score to >/=27/30 to decr. falls risk.    Baseline 13/30. 05/02/20  25/30    Time 4    Period Weeks    Status Revised    Target Date 06/01/20  Plan - 05/07/20 1414    Clinical Impression Statement PT focused more on floor to standing transfers per pt request today. Pt able to perform without a support surface but needs CGA for safety. Advised to put her mat near sofa to aide with getting up. Pt was more tentative with steps today.    Personal Factors and Comorbidities Age;Comorbidity 3+;Time since onset of injury/illness/exacerbation;Past/Current Experience    Comorbidities Tx myelopathy, HTN, macular degeneration, B cataract extraction (2021), hypothyroidism years ago, anxiety, arthritis.    Examination-Activity Limitations Bend;Bathing;Locomotion Level;Transfers;Reach Overhead;Carry;Dressing;Stand;Stairs;Squat    Examination-Participation Restrictions Cleaning;Community Activity;Driving;Interpersonal Relationship;Laundry;Meal Prep    Stability/Clinical Decision Making Evolving/Moderate complexity    Rehab Potential Good    PT Frequency 2x / week   followed by 1x/week for 2 weeks.   PT Duration 2 weeks    PT Treatment/Interventions ADLs/Self Care Home Management;Aquatic Therapy;Biofeedback;Electrical Stimulation;DME Instruction;Balance training;Therapeutic exercise;Therapeutic activities;Patient/family education;Functional mobility training;Stair training;Gait training;Neuromuscular re-education;Manual techniques;Vestibular;Orthotic Fit/Training    PT Next Visit Plan How did floor transfer go at home? Focus on core stabilization progression, neutral spine due to stenosis. Proper body mechanics to protect back. Pain is mostly early in day and very limiting. Continue with high level balance, esp compliant surfaces, EC and SLS.    Consulted and Agree with Plan of Care Patient;Family member/caregiver           Patient will benefit from skilled therapeutic intervention in order to improve the following deficits and impairments:  Abnormal  gait,Decreased coordination,Decreased range of motion,Decreased endurance,Decreased knowledge of use of DME,Decreased balance,Decreased mobility,Decreased strength,Postural dysfunction,Impaired flexibility,Pain  Visit Diagnosis: Other abnormalities of gait and mobility  Muscle weakness (generalized)  Unsteadiness on feet     Problem List Patient Active Problem List   Diagnosis Date Noted  . Neurogenic bladder 06/29/2018  . Essential hypertension   . Cervical spondylosis without myelopathy   . Steroid-induced hyperglycemia   . Postoperative pain   . Hyponatremia   . Leucocytosis   . Thoracic myelopathy 04/22/2018  . ALLERGIC RHINITIS 01/19/2007    Ronn Melena, PT, DPT, NCS 05/07/2020, 2:22 PM  [facility] [address] [place], [place], [address] Phone: [phone]   Fax:  [phone]  Name: Cheryl Pearson MRN: [id number] Date of Birth: [birth year]

## 2020-05-09 ENCOUNTER — Other Ambulatory Visit: Payer: Self-pay

## 2020-05-09 ENCOUNTER — Ambulatory Visit: Payer: Medicare PPO

## 2020-05-09 DIAGNOSIS — R2689 Other abnormalities of gait and mobility: Secondary | ICD-10-CM | POA: Diagnosis not present

## 2020-05-09 DIAGNOSIS — M5442 Lumbago with sciatica, left side: Secondary | ICD-10-CM | POA: Diagnosis not present

## 2020-05-09 DIAGNOSIS — M6281 Muscle weakness (generalized): Secondary | ICD-10-CM | POA: Diagnosis not present

## 2020-05-09 DIAGNOSIS — M5441 Lumbago with sciatica, right side: Secondary | ICD-10-CM | POA: Diagnosis not present

## 2020-05-09 DIAGNOSIS — G8929 Other chronic pain: Secondary | ICD-10-CM | POA: Diagnosis not present

## 2020-05-09 DIAGNOSIS — R2681 Unsteadiness on feet: Secondary | ICD-10-CM | POA: Diagnosis not present

## 2020-05-09 NOTE — Therapy (Signed)
[facility] [address] [place], [place], [address] Phone: [phone]   Fax:  [phone]  Physical Therapy Treatment  Patient Details  Name: Cheryl Pearson MRN: [id number] Date of Birth: 1941-03-16 Referring Provider (PT): Dr. Lisbeth Renshaw   Encounter Date: 05/09/2020   PT End of Session - 05/09/20 1400    Visit Number 15    Number of Visits 19    Date for PT Re-Evaluation 07/01/20   30 day poc, 60 day cert   Authorization Type [employer] medicare: auth 03/07/20-06/05/20    Progress Note Due on Visit 10    PT Start Time 1400    PT Stop Time 1445    PT Time Calculation (min) 45 min    Equipment Utilized During Treatment Gait belt    Activity Tolerance Patient tolerated treatment well    Behavior During Therapy [facility] for tasks assessed/performed           Past Medical History:  Diagnosis Date  . Anxiety   . Arthritis   . Colitis   . Early age-related macular degeneration   . Hernia   . Hypertension   . Infertility, female   . Menopausal symptoms   . Rosacea   . Thyroid disease 1968   hypothyroidism treated with meds for short time then all labs normal    Past Surgical History:  Procedure Laterality Date  . bone spur Left   . COLONOSCOPY  11/2011   polyp, ulcerative coliits inactive  . COLONOSCOPY  2005  . COLONOSCOPY  1970's   diagnosed with ulcerative colitis  . ESOPHAGOGASTRODUODENOSCOPY (EGD) WITH PROPOFOL N/A 11/20/2014   Procedure: ESOPHAGOGASTRODUODENOSCOPY (EGD) WITH PROPOFOL;  Surgeon: Carman Ching, MD;  Location: [facility];  Service: Endoscopy;  Laterality: N/A;  . INGUINAL HERNIA REPAIR Right 37 mos old  . LAPAROSCOPIC CHOLECYSTECTOMY  09/1998  . SAVORY DILATION N/A 11/20/2014   Procedure: SAVORY DILATION;  Surgeon: Carman Ching, MD;  Location: [facility];  Service: Endoscopy;  Laterality: N/A;  . WISDOM TOOTH EXTRACTION  1968    There were no vitals filed for this visit.    Subjective Assessment - 05/09/20 1400    Subjective Pt reports she has been trying to work on getting off floor at home and doing pretty good. She does find her muscles get sore from doing it.    Patient is accompained by: Family member   Rick: husband   Pertinent History Tx myelopathy, HTN, macular degeneration, B cataract extraction (2021), hypothyroidism years ago, anxiety, arthritis.    Patient Stated Goals To decr. pain    Currently in Pain? Yes    Pain Score 2     Pain Location Hip   and knee   Pain Orientation Left    Pain Descriptors / Indicators Aching    Pain Type Chronic pain    Pain Onset More than a month ago                             [facility] Adult PT Treatment/Exercise - 05/09/20 1402      Ambulation/Gait   Ambulation/Gait Yes    Ambulation/Gait Assistance 5: Supervision    Ambulation/Gait Assistance Details Pt ambulated with holding 3# weight in 1 hand 115' then 230' with switching hands with weight on command. Pt reports hips get tired as she goes on. Walking with holding [place]miton racket in one hand trying to keep birdie on racket 115' with right  hand then 39' with left hand. Dropped birdie 1 time with left hand.    Ambulation Distance (Feet) 545 Feet    Assistive device None    Gait Pattern Step-through pattern;Decreased step length - right;Decreased step length - left    Ambulation Surface Level;Indoor      Exercises   Exercises Other Exercises    Other Exercises  Hooklying transverse abdominus contraction with hip flexion raising each leg up 1 at a time then back down x 10, bridges x 10, TA with heel slide x 10 keeping leg off mat, bilateral bent knee fall outs x 10. Standing stepping forward with opposite shoulder flexion with 3# weight x 10 each side. Pt cued to keep core tight throughout. Pt less steady with stepping forward with left leg. Mini-squats in front of mat with holding 3# weights in each hand. At bottom of steps: stepping up on bottom  step then balancing on 1 leg with opposite hip flexion x 5 each leg with fingertip support of 1 hand as needed then repeated with raising 3# weight over head x 5 each leg with 1 UE light support CGA.                    PT Short Term Goals - 05/02/20 1915      PT SHORT TERM GOAL #1   Title STGs=LTGs             PT Long Term Goals - 05/02/20 1915      PT LONG TERM GOAL #1   Title Pt will be independent with progressive HEP for balance and core stabilization to continue gains on own.    Time 4    Period Weeks    Status New    Target Date 06/01/20      PT LONG TERM GOAL #2   Title Pt will report LBP/hip pain at worst during ADLs is </=3/10 to improve QOL and perform ADLs safely.    Baseline 10/10. 05/02/20 Pt reports that pain varies during the day. Earlier in day pain up to 5/10. Later in day she doesn't have much pain at all.    Time 4    Period Weeks    Status Revised    Target Date 06/01/20      PT LONG TERM GOAL #3   Title Pt will be instructed in ways to better manage her back pain to improve function and verbalize understanding.    Time 4    Period Weeks    Status New    Target Date 06/01/20      PT LONG TERM GOAL #4   Title Pt will improve FGA score to >/=27/30 to decr. falls risk.    Baseline 13/30. 05/02/20 25/30    Time 4    Period Weeks    Status Revised    Target Date 06/01/20                 Plan - 05/09/20 1604    Clinical Impression Statement PT focused on core stabilization progression during session today incorporating more standing activities for balance component as well. Pt improved with practice but is less steady with increased SLS on right LE. Did well with maintaining TA contraction with activities.    Personal Factors and Comorbidities Age;Comorbidity 3+;Time since onset of injury/illness/exacerbation;Past/Current Experience    Comorbidities Tx myelopathy, HTN, macular degeneration, B cataract extraction (2021), hypothyroidism  years ago, anxiety, arthritis.    Examination-Activity Limitations Bend;Bathing;Locomotion Level;Transfers;Reach Overhead;Carry;Dressing;Stand;Stairs;Squat  Examination-Participation Restrictions Cleaning;Community Activity;Driving;Interpersonal Relationship;Laundry;Meal Prep    Stability/Clinical Decision Making Evolving/Moderate complexity    Rehab Potential Good    PT Frequency 2x / week   followed by 1x/week for 2 weeks.   PT Duration 2 weeks    PT Treatment/Interventions ADLs/Self Care Home Management;Aquatic Therapy;Biofeedback;Electrical Stimulation;DME Instruction;Balance training;Therapeutic exercise;Therapeutic activities;Patient/family education;Functional mobility training;Stair training;Gait training;Neuromuscular re-education;Manual techniques;Vestibular;Orthotic Fit/Training    PT Next Visit Plan Focus on core stabilization progression, neutral spine due to stenosis. Proper body mechanics to protect back. Pain is mostly early in day and very limiting. Continue with high level balance, esp compliant surfaces, EC and SLS.    Consulted and Agree with Plan of Care Patient;Family member/caregiver           Patient will benefit from skilled therapeutic intervention in order to improve the following deficits and impairments:  Abnormal gait,Decreased coordination,Decreased range of motion,Decreased endurance,Decreased knowledge of use of DME,Decreased balance,Decreased mobility,Decreased strength,Postural dysfunction,Impaired flexibility,Pain  Visit Diagnosis: Other abnormalities of gait and mobility  Muscle weakness (generalized)  Chronic midline low back pain with bilateral sciatica     Problem List Patient Active Problem List   Diagnosis Date Noted  . Neurogenic bladder 06/29/2018  . Essential hypertension   . Cervical spondylosis without myelopathy   . Steroid-induced hyperglycemia   . Postoperative pain   . Hyponatremia   . Leucocytosis   . Thoracic myelopathy  04/22/2018  . ALLERGIC RHINITIS 01/19/2007    Ronn Melena, PT, DPT, NCS 05/09/2020, 4:06 PM  [place] [facility] [address] [place], [place], [address] Phone: [phone]   Fax:  [phone]  Name: Cheryl Pearson MRN: [id number] Date of Birth: 1940/09/15

## 2020-05-13 ENCOUNTER — Ambulatory Visit: Payer: Medicare PPO | Admitting: Rehabilitation

## 2020-05-16 ENCOUNTER — Other Ambulatory Visit: Payer: Self-pay

## 2020-05-16 ENCOUNTER — Ambulatory Visit: Payer: Medicare PPO

## 2020-05-16 DIAGNOSIS — M5442 Lumbago with sciatica, left side: Secondary | ICD-10-CM

## 2020-05-16 DIAGNOSIS — R2689 Other abnormalities of gait and mobility: Secondary | ICD-10-CM | POA: Diagnosis not present

## 2020-05-16 DIAGNOSIS — M6281 Muscle weakness (generalized): Secondary | ICD-10-CM

## 2020-05-16 DIAGNOSIS — G8929 Other chronic pain: Secondary | ICD-10-CM

## 2020-05-16 DIAGNOSIS — M5441 Lumbago with sciatica, right side: Secondary | ICD-10-CM | POA: Diagnosis not present

## 2020-05-16 DIAGNOSIS — R2681 Unsteadiness on feet: Secondary | ICD-10-CM | POA: Diagnosis not present

## 2020-05-16 NOTE — Patient Instructions (Signed)
Access Code: [email or web address] URL: [email or web address] Date: 05/16/2020 Prepared by: Elmer Bales  Exercises Supine Transversus Abdominis Bracing - Hands on Stomach - 3 x daily - 7 x weekly - 2 sets - 10 reps - 30 sec hold Supine Transversus Abdominis Bracing with Leg Extension - 2 x daily - 5 x weekly - 1 sets - 10 reps Supine Transversus Abdominis Bracing with Double Leg Fallout - 2 x daily - 5 x weekly - 1 sets - 10 reps Beginner Bridge - 2 x daily - 5 x weekly - 1 sets - 10 reps Seated Table Hamstring Stretch - 1 x daily - 7 x weekly - 1 sets - 2-3 reps - 30-60 hold Wide Stance with Head Rotation on Foam Pad - 1 x daily - 5 x weekly - 1 sets - 10 reps Wide Stance with Head Nods on Foam Pad - 1 x daily - 5 x weekly - 1 sets - 10 reps Wide Stance with Eyes Closed on Foam Pad - 1 x daily - 5 x weekly - 1 sets - 2 reps - 20 secs hold Prone Quadriceps Stretch with Strap - 1 x daily - 7 x weekly - 1 sets - 2 reps - 30 secs hold Seated March with Opposite Arm Flexion on Swiss Ball - 1 x daily - 5 x weekly - 1 sets - 10 reps Tandem Stance in Corner - 2 x daily - 5 x weekly - 1 sets - 3 reps - 30 sec hold Seated lateral trunk stretch - 3 x daily - 7 x weekly - 1 sets - 4 reps - 20 sec hold Modified Thomas Stretch - 3 x daily - 7 x weekly - 1 sets - 4 reps - 20-30 sec hold

## 2020-05-16 NOTE — Therapy (Signed)
[facility] [address] [place], [place], [address] Phone: [phone]   Fax:  [phone]  Physical Therapy Treatment  Patient Details  Name: Cheryl Pearson MRN: [id number] Date of Birth: Dec 15, 1940 Referring Provider (PT): Dr. Lisbeth Renshaw   Encounter Date: 05/16/2020   PT End of Session - 05/16/20 1406    Visit Number 16    Number of Visits 19    Date for PT Re-Evaluation 07/01/20   30 day poc, 60 day cert   Authorization Type [employer] medicare: auth 03/07/20-06/05/20    Progress Note Due on Visit 10    PT Start Time 1403    PT Stop Time 1453    PT Time Calculation (min) 50 min    Equipment Utilized During Treatment Gait belt    Activity Tolerance Patient tolerated treatment well    Behavior During Therapy [facility] for tasks assessed/performed           Past Medical History:  Diagnosis Date  . Anxiety   . Arthritis   . Colitis   . Early age-related macular degeneration   . Hernia   . Hypertension   . Infertility, female   . Menopausal symptoms   . Rosacea   . Thyroid disease 1968   hypothyroidism treated with meds for short time then all labs normal    Past Surgical History:  Procedure Laterality Date  . bone spur Left   . COLONOSCOPY  11/2011   polyp, ulcerative coliits inactive  . COLONOSCOPY  2005  . COLONOSCOPY  1970's   diagnosed with ulcerative colitis  . ESOPHAGOGASTRODUODENOSCOPY (EGD) WITH PROPOFOL N/A 11/20/2014   Procedure: ESOPHAGOGASTRODUODENOSCOPY (EGD) WITH PROPOFOL;  Surgeon: Carman Ching, MD;  Location: [facility];  Service: Endoscopy;  Laterality: N/A;  . INGUINAL HERNIA REPAIR Right 22 mos old  . LAPAROSCOPIC CHOLECYSTECTOMY  09/1998  . SAVORY DILATION N/A 11/20/2014   Procedure: SAVORY DILATION;  Surgeon: Carman Ching, MD;  Location: [facility];  Service: Endoscopy;  Laterality: N/A;  . WISDOM TOOTH EXTRACTION  1968    There were no vitals filed for this visit.    Subjective Assessment - 05/16/20 1406    Subjective Pt reports that she feels back is improving some with less pain in the morning but does last a little longer. Did not take her advil today but took a nap.    Patient is accompained by: Family member   Rick: husband   Pertinent History Tx myelopathy, HTN, macular degeneration, B cataract extraction (2021), hypothyroidism years ago, anxiety, arthritis.    Patient Stated Goals To decr. pain    Currently in Pain? No/denies    Pain Onset More than a month ago                             [facility] Adult PT Treatment/Exercise - 05/16/20 1409      Ambulation/Gait   Ambulation/Gait Yes    Ambulation/Gait Assistance 5: Supervision    Ambulation/Gait Assistance Details Pt was given verbal cues to relax arms for more arm swing. PT noted that pt scuffing left foot at times and did not seem to fully extend left knee. Pt reports that some pain in left leg may be the reason. Assessed leg length and posture as noted below.    Ambulation Distance (Feet) 345 Feet    Assistive device None    Gait Pattern Step-through pattern;Right flexed knee in stance    Ambulation  Surface Level;Indoor    Gait Comments PT noted pt catching left foot more today and did not seem to fully extend. Assessed posture in standing and noted left ASIS higher than right. In supine ASIS to malleolus right=36 and left= 35". Both 30" greater trochanter to lateral malleolus. After manual techniques and stretching ASIS more level in standing.      Neuro Re-ed    Neuro Re-ed Details  In // bars: step-ups on soft blue foam beam x 10 each leg with verbal cues to tighten core to help with stability throughout.      Exercises   Exercises Other Exercises    Other Exercises  Supine with hip flexor stretch off edge of mat 30 sec x 3 with verbal cues to keep back flat. Pt reported a little pain in hip that did improve some with repositioning and keeping back flatter. In standing  discussed and demonstrated left lateral trunk flexion stretch.      Manual Therapy   Manual Therapy Muscle Energy Technique    Manual therapy comments Passive pelvic anterior rotation stretching 20 sec x 4.    Muscle Energy Technique PT performed contract relax in right sidelying to left quadratus with having pt hike hip in to resistance and then pelvic depression with 5 sec contract and 20 sec hold x 4 reps.                  PT Education - 05/16/20 1504    Education Details Added hip flexor and lateral trunk stretch    Person(s) Educated Patient    Methods Explanation;Demonstration;Handout    Comprehension Verbalized understanding;Returned demonstration            PT Short Term Goals - 05/02/20 1915      PT SHORT TERM GOAL #1   Title STGs=LTGs             PT Long Term Goals - 05/02/20 1915      PT LONG TERM GOAL #1   Title Pt will be independent with progressive HEP for balance and core stabilization to continue gains on own.    Time 4    Period Weeks    Status New    Target Date 06/01/20      PT LONG TERM GOAL #2   Title Pt will report LBP/hip pain at worst during ADLs is </=3/10 to improve QOL and perform ADLs safely.    Baseline 10/10. 05/02/20 Pt reports that pain varies during the day. Earlier in day pain up to 5/10. Later in day she doesn't have much pain at all.    Time 4    Period Weeks    Status Revised    Target Date 06/01/20      PT LONG TERM GOAL #3   Title Pt will be instructed in ways to better manage her back pain to improve function and verbalize understanding.    Time 4    Period Weeks    Status New    Target Date 06/01/20      PT LONG TERM GOAL #4   Title Pt will improve FGA score to >/=27/30 to decr. falls risk.    Baseline 13/30. 05/02/20 25/30    Time 4    Period Weeks    Status Revised    Target Date 06/01/20                 Plan - 05/16/20 1505    Clinical Impression Statement PT noted some  pelvis discrepancies with  left pelvis riding higher and posteriorly rotated. Responded well to manual techniques with immediate improvement noted. Pt continues to demonstrate some instability with gait that varies day to day. Per pt back/hip pain slowly improving but still there.    Personal Factors and Comorbidities Age;Comorbidity 3+;Time since onset of injury/illness/exacerbation;Past/Current Experience    Comorbidities Tx myelopathy, HTN, macular degeneration, B cataract extraction (2021), hypothyroidism years ago, anxiety, arthritis.    Examination-Activity Limitations Bend;Bathing;Locomotion Level;Transfers;Reach Overhead;Carry;Dressing;Stand;Stairs;Squat    Examination-Participation Restrictions Cleaning;Community Activity;Driving;Interpersonal Relationship;Laundry;Meal Prep    Stability/Clinical Decision Making Evolving/Moderate complexity    Rehab Potential Good    PT Frequency 2x / week   followed by 1x/week for 2 weeks.   PT Duration 2 weeks    PT Treatment/Interventions ADLs/Self Care Home Management;Aquatic Therapy;Biofeedback;Electrical Stimulation;DME Instruction;Balance training;Therapeutic exercise;Therapeutic activities;Patient/family education;Functional mobility training;Stair training;Gait training;Neuromuscular re-education;Manual techniques;Vestibular;Orthotic Fit/Training    PT Next Visit Plan How are new stretches going? Focus on core stabilization progression, neutral spine due to stenosis. Proper body mechanics to protect back. Pain is mostly early in day and  limiting. Continue with high level balance, esp compliant surfaces, EC and SLS.    Consulted and Agree with Plan of Care Patient;Family member/caregiver           Patient will benefit from skilled therapeutic intervention in order to improve the following deficits and impairments:  Abnormal gait,Decreased coordination,Decreased range of motion,Decreased endurance,Decreased knowledge of use of DME,Decreased balance,Decreased mobility,Decreased  strength,Postural dysfunction,Impaired flexibility,Pain  Visit Diagnosis: Other abnormalities of gait and mobility  Muscle weakness (generalized)  Chronic midline low back pain with bilateral sciatica     Problem List Patient Active Problem List   Diagnosis Date Noted  . Neurogenic bladder 06/29/2018  . Essential hypertension   . Cervical spondylosis without myelopathy   . Steroid-induced hyperglycemia   . Postoperative pain   . Hyponatremia   . Leucocytosis   . Thoracic myelopathy 04/22/2018  . ALLERGIC RHINITIS 01/19/2007    Ronn Melena, PT, DPT, NCS 05/16/2020, 3:08 PM  [facility] [address] [place], [place], [address] Phone: [phone]   Fax:  [phone]  Name: Cheryl Pearson MRN: [id number] Date of Birth: [birth year]

## 2020-05-20 ENCOUNTER — Encounter: Payer: Self-pay | Admitting: Occupational Therapy

## 2020-05-20 NOTE — Therapy (Signed)
[place] [address] [place], [place], [address] Phone: [phone]   Fax:  [phone]  Patient Details  Name: Cheryl Pearson MRN: [id number] Date of Birth: 1940/07/11 Referring Provider:  No ref. provider found  Encounter Date: 05/20/2020  OCCUPATIONAL THERAPY DISCHARGE SUMMARY  Visits from Start of Care: 15  Current functional level related to goals / functional outcomes:  OT Short Term Goals - 07/12/18 1509      OT SHORT TERM GOAL #1   Title Pt will be independent with initial HEP.--check STG 06/21/18    Time 6    Period Weeks    Status Achieved      OT SHORT TERM GOAL #2   Title Pt will perform simple cooking task mod I.    Baseline 06/06/18 revised as pt no longer has back precautions.     Time 6    Period Weeks    Status On-going    06/20/18: not performing.  06/28/18:  not fully met, simple cold meal prep.  07/05/18 has simulated in clinic     OT [place] #3   Title Pt will perform simple home maintenance task mod I.    Baseline 06/06/18 revised as pt no longer has back precautions.     Time 6    Period Weeks    Status Achieved   06/20/18: not performing.  06/23/18:  only washing dishes, picking up a little. 07/05/18  met in clinic     OT [place] #4   Title Pt will verbalize understanding of energy conservation strategies for ADLs/IADLs.    Time 6    Period Weeks    Status Achieved   06/23/18:  not fully met.  07/05/18:  met.     OT SHORT TERM GOAL #5   Title Pt will report R shoulder pain less than or equal to 4/10 for ADLs.    Time 6    Period Weeks    Status Achieved   06/20/18     OT SHORT TERM GOAL #6   Title Pt will improve R dominant hand grip strength by at least 5lbs to assist with IADLs.    Baseline 48lbs    Time 6    Period Weeks    Status On-going   06/28/18:  51lbs          OT Long Term Goals - 05/10/18 1526      OT LONG TERM GOAL #1   Title Pt will be independent with UE  strengthening HEP.--check LTGs 08/08/18    Time 12    Period Weeks    Status New      OT LONG TERM GOAL #2   Title Pt will perform mod complex cooking/cleaning tasks mod I.    Time 12    Period Weeks    Status New      OT LONG TERM GOAL #3   Title Pt will be able to stand to perform IADL/leisure activity for at least 40 min consistently without rest break.    Time 12    Period Weeks    Status New      OT LONG TERM GOAL #4   Title Pt will report R shoulder pain consistently less than or equal to 3/10 for ADLs/IADLs.    Time 12    Period Weeks    Status New             Remaining deficits: Above is last known  status with LTGs not met/not assessed due to care interruption due to center closure due to Covid-19.     Education / Equipment: Pt instructed in HEP and energy conservation strategies.  Other education not completed due to pt not returning to OT.  Plan: Patient agrees to discharge.  Patient goals were not met. Patient is being discharged due to not returning since the last visit.  Care was interrupted due to center closure during beginning of Covid-19 pandemic and pt did not return to OT. ?????      [facility] 05/20/2020, 11:18 AM  [place] [address] [place], [place], [address] Phone: [phone]   Fax:  [place], OTR/L [facility] [address] [place], [place]  [address] [phone] phone [phone] 05/20/20 11:19 AM

## 2020-05-23 ENCOUNTER — Other Ambulatory Visit: Payer: Self-pay

## 2020-05-23 ENCOUNTER — Ambulatory Visit: Payer: Medicare PPO

## 2020-05-23 DIAGNOSIS — M6281 Muscle weakness (generalized): Secondary | ICD-10-CM

## 2020-05-23 DIAGNOSIS — M5441 Lumbago with sciatica, right side: Secondary | ICD-10-CM | POA: Diagnosis not present

## 2020-05-23 DIAGNOSIS — R2681 Unsteadiness on feet: Secondary | ICD-10-CM | POA: Diagnosis not present

## 2020-05-23 DIAGNOSIS — M5442 Lumbago with sciatica, left side: Secondary | ICD-10-CM | POA: Diagnosis not present

## 2020-05-23 DIAGNOSIS — R2689 Other abnormalities of gait and mobility: Secondary | ICD-10-CM | POA: Diagnosis not present

## 2020-05-23 DIAGNOSIS — G8929 Other chronic pain: Secondary | ICD-10-CM | POA: Diagnosis not present

## 2020-05-23 NOTE — Therapy (Signed)
[facility] [address] [place], [place], [address] Phone: [phone]   Fax:  [phone]  Physical Therapy Treatment  Patient Details  Name: Cheryl Pearson MRN: [id number] Date of Birth: 04/27/1941 Referring Provider (PT): Dr. Lisbeth Renshaw   Encounter Date: 05/23/2020   PT End of Session - 05/23/20 1640    Visit Number 17    Number of Visits 19    Date for PT Re-Evaluation 07/01/20    Authorization Type [employer] medicare: auth 03/07/20-06/05/20    Progress Note Due on Visit 10    PT Start Time 1317    PT Stop Time 1401    PT Time Calculation (min) 44 min    Equipment Utilized During Treatment Gait belt    Activity Tolerance Patient tolerated treatment well    Behavior During Therapy [facility] for tasks assessed/performed           Past Medical History:  Diagnosis Date  . Anxiety   . Arthritis   . Colitis   . Early age-related macular degeneration   . Hernia   . Hypertension   . Infertility, female   . Menopausal symptoms   . Rosacea   . Thyroid disease 1968   hypothyroidism treated with meds for short time then all labs normal    Past Surgical History:  Procedure Laterality Date  . bone spur Left   . COLONOSCOPY  11/2011   polyp, ulcerative coliits inactive  . COLONOSCOPY  2005  . COLONOSCOPY  1970's   diagnosed with ulcerative colitis  . ESOPHAGOGASTRODUODENOSCOPY (EGD) WITH PROPOFOL N/A 11/20/2014   Procedure: ESOPHAGOGASTRODUODENOSCOPY (EGD) WITH PROPOFOL;  Surgeon: Carman Ching, MD;  Location: [facility];  Service: Endoscopy;  Laterality: N/A;  . INGUINAL HERNIA REPAIR Right 34 mos old  . LAPAROSCOPIC CHOLECYSTECTOMY  09/1998  . SAVORY DILATION N/A 11/20/2014   Procedure: SAVORY DILATION;  Surgeon: Carman Ching, MD;  Location: [facility];  Service: Endoscopy;  Laterality: N/A;  . WISDOM TOOTH EXTRACTION  1968    There were no vitals filed for this visit.   Subjective Assessment - 05/23/20  1321    Subjective Pt states she must be doing better as she forgot her regular cane. Pt denied falls or changes since last visit. Stretches have been going well. Pt feels her biggest concern is still her balance.    Patient is accompained by: Family member   Rick: husband   Pertinent History Tx myelopathy, HTN, macular degeneration, B cataract extraction (2021), hypothyroidism years ago, anxiety, arthritis.    Patient Stated Goals To decr. pain    Currently in Pain? No/denies                             [facility] Adult PT Treatment/Exercise - 05/23/20 1347      Ambulation/Gait   Ambulation/Gait Yes    Ambulation/Gait Assistance 5: Supervision    Ambulation/Gait Assistance Details Cues to improve L heel strike and arm swing.    Ambulation Distance (Feet) 230 Feet    Assistive device None    Gait Pattern Step-through pattern;Right flexed knee in stance    Ambulation Surface Level;Indoor    Ramp 5: Supervision    Ramp Details (indicate cue type and reason) Cues to shift weight according to incline/decline x3      High Level Balance   High Level Balance Activities Backward walking;Head turns;Other (comment)   amb. with eyes closed   High Level  Balance Comments Performed //bars and min guard to S for safety: performed the above 6-8x/activity and cone taps, hurdles 2x4 reps/LE. Cues and demo to improve weight shifting to stance LE. Pt utilized intermittent UE support to maintain balance.                  PT Education - 05/23/20 1639    Education Details PT educated pt on why she's performing stretches for functional LLD and the difference between functional and structural LLD.    Person(s) Educated Patient;Spouse    Methods Explanation    Comprehension Verbalized understanding            PT Short Term Goals - 05/02/20 1915      PT SHORT TERM GOAL #1   Title STGs=LTGs             PT Long Term Goals - 05/02/20 1915      PT LONG TERM GOAL #1   Title Pt  will be independent with progressive HEP for balance and core stabilization to continue gains on own.    Time 4    Period Weeks    Status New    Target Date 06/01/20      PT LONG TERM GOAL #2   Title Pt will report LBP/hip pain at worst during ADLs is </=3/10 to improve QOL and perform ADLs safely.    Baseline 10/10. 05/02/20 Pt reports that pain varies during the day. Earlier in day pain up to 5/10. Later in day she doesn't have much pain at all.    Time 4    Period Weeks    Status Revised    Target Date 06/01/20      PT LONG TERM GOAL #3   Title Pt will be instructed in ways to better manage her back pain to improve function and verbalize understanding.    Time 4    Period Weeks    Status New    Target Date 06/01/20      PT LONG TERM GOAL #4   Title Pt will improve FGA score to >/=27/30 to decr. falls risk.    Baseline 13/30. 05/02/20 25/30    Time 4    Period Weeks    Status Revised    Target Date 06/01/20                 Plan - 05/23/20 1640    Clinical Impression Statement Pt demonstrated progress as she was able to amb. without AD today and progress from utilizing UE support in // bars to no UE support during high level balance activities. Pt noted to experience narrow BOS during retro gait and was able to improve with cues to engage glute med musculature. Pt continues to experience incr. postural sway during SLS activities and activities that require incr. vestibular system input. Pt would continue to benefit from skilled PT to improve safety during functional mobility.    Personal Factors and Comorbidities Age;Comorbidity 3+;Time since onset of injury/illness/exacerbation;Past/Current Experience    Comorbidities Tx myelopathy, HTN, macular degeneration, B cataract extraction (2021), hypothyroidism years ago, anxiety, arthritis.    Examination-Activity Limitations Bend;Bathing;Locomotion Level;Transfers;Reach Overhead;Carry;Dressing;Stand;Stairs;Squat     Examination-Participation Restrictions Cleaning;Community Activity;Driving;Interpersonal Relationship;Laundry;Meal Prep    Stability/Clinical Decision Making Evolving/Moderate complexity    Rehab Potential Good    PT Frequency 2x / week   followed by 1x/week for 2 weeks.   PT Duration 2 weeks    PT Treatment/Interventions ADLs/Self Care Home Management;Aquatic Therapy;Biofeedback;Electrical Stimulation;DME Instruction;Balance training;Therapeutic  exercise;Therapeutic activities;Patient/family education;Functional mobility training;Stair training;Gait training;Neuromuscular re-education;Manual techniques;Vestibular;Orthotic Fit/Training    PT Next Visit Plan Assess LTGs, and decide between d/c or renewal--I feel like she could benefit from another 1x/week for 4 weeks?  Focus on core stabilization progression, neutral spine due to stenosis. Proper body mechanics to protect back. Pain is mostly early in day and  limiting. Continue with high level balance, esp compliant surfaces, EC and SLS.    Consulted and Agree with Plan of Care Patient;Family member/caregiver           Patient will benefit from skilled therapeutic intervention in order to improve the following deficits and impairments:  Abnormal gait,Decreased coordination,Decreased range of motion,Decreased endurance,Decreased knowledge of use of DME,Decreased balance,Decreased mobility,Decreased strength,Postural dysfunction,Impaired flexibility,Pain  Visit Diagnosis: Other abnormalities of gait and mobility  Muscle weakness (generalized)  Unsteadiness on feet     Problem List Patient Active Problem List   Diagnosis Date Noted  . Neurogenic bladder 06/29/2018  . Essential hypertension   . Cervical spondylosis without myelopathy   . Steroid-induced hyperglycemia   . Postoperative pain   . Hyponatremia   . Leucocytosis   . Thoracic myelopathy 04/22/2018  . ALLERGIC RHINITIS 01/19/2007    Tyquavious Gamel L 05/23/2020, 4:44  PM  [facility] [address] [place], [place], [address] Phone: [phone]   Fax:  [phone]  Name: Cheryl Pearson MRN: [id number] Date of Birth: 25-Jan-1941  Zerita Boers, PT,DPT 05/23/20 4:44 PM Phone: [phone] Fax: [phone]

## 2020-05-27 ENCOUNTER — Ambulatory Visit: Payer: Medicare PPO | Admitting: Rehabilitation

## 2020-05-28 DIAGNOSIS — Z961 Presence of intraocular lens: Secondary | ICD-10-CM | POA: Diagnosis not present

## 2020-05-30 ENCOUNTER — Ambulatory Visit: Payer: Medicare PPO | Attending: Neurosurgery

## 2020-05-30 ENCOUNTER — Other Ambulatory Visit: Payer: Self-pay

## 2020-05-30 DIAGNOSIS — M6281 Muscle weakness (generalized): Secondary | ICD-10-CM | POA: Diagnosis not present

## 2020-05-30 DIAGNOSIS — M5441 Lumbago with sciatica, right side: Secondary | ICD-10-CM | POA: Insufficient documentation

## 2020-05-30 DIAGNOSIS — R2689 Other abnormalities of gait and mobility: Secondary | ICD-10-CM | POA: Diagnosis not present

## 2020-05-30 DIAGNOSIS — G8929 Other chronic pain: Secondary | ICD-10-CM | POA: Diagnosis not present

## 2020-05-30 DIAGNOSIS — M5442 Lumbago with sciatica, left side: Secondary | ICD-10-CM | POA: Diagnosis not present

## 2020-05-30 NOTE — Patient Instructions (Addendum)
Access Code: [email or web address] URL: [email or web address] Date: 05/30/2020 Prepared by: Elmer Bales  Exercises Supine Transversus Abdominis Bracing - Hands on Stomach - 3 x daily - 7 x weekly - 2 sets - 10 reps - 30 sec hold Supine Transversus Abdominis Bracing with Leg Extension - 2 x daily - 5 x weekly - 1 sets - 10 reps Supine Transversus Abdominis Bracing with Double Leg Fallout - 2 x daily - 5 x weekly - 1 sets - 10 reps Beginner Bridge - 2 x daily - 5 x weekly - 1 sets - 10 reps Seated Table Hamstring Stretch - 1 x daily - 7 x weekly - 1 sets - 2-3 reps - 30-60 hold Wide Stance with Head Rotation on Foam Pad - 1 x daily - 5 x weekly - 1 sets - 10 reps Wide Stance with Head Nods on Foam Pad - 1 x daily - 5 x weekly - 1 sets - 10 reps Wide Stance with Eyes Closed on Foam Pad - 1 x daily - 5 x weekly - 1 sets - 2 reps - 20 secs hold Seated March with Opposite Arm Flexion on Swiss Ball - 1 x daily - 5 x weekly - 1 sets - 10 reps Tandem Stance in Corner - 2 x daily - 5 x weekly - 1 sets - 3 reps - 30 sec hold Seated lateral trunk stretch - 3 x daily - 7 x weekly - 1 sets - 4 reps - 20 sec hold Modified Thomas Stretch - 3 x daily - 7 x weekly - 1 sets - 4 reps - 20-30 sec hold

## 2020-05-30 NOTE — Therapy (Signed)
[facility] [address] [place], [place], [address] Phone: [phone]   Fax:  [phone]  Physical Therapy Treatment/Discharge  Patient Details  Name: Cheryl Pearson MRN: [id number] Date of Birth: 1941/04/14 Referring Provider (PT): Dr. Consuella Lose  PHYSICAL THERAPY DISCHARGE SUMMARY  Visits from Start of Care: 18  Current functional level related to goals / functional outcomes: See clinical impression and goals for more information.   Remaining deficits: Back pain, balance deficits   Education / Equipment: HEP  Plan: Patient agrees to discharge.  Patient goals were partially met. Patient is being discharged due to lack of progress.  ?????       Encounter Date: 05/30/2020   PT End of Session - 05/30/20 1317    Visit Number 18    Number of Visits 19    Date for PT Re-Evaluation 07/01/20    Authorization Type [employer] medicare: auth 03/07/20-06/05/20    Progress Note Due on Visit 10    PT Start Time 1315    PT Stop Time 1408    PT Time Calculation (min) 53 min    Equipment Utilized During Treatment Gait belt    Activity Tolerance Patient tolerated treatment well    Behavior During Therapy WFL for tasks assessed/performed           Past Medical History:  Diagnosis Date  . Anxiety   . Arthritis   . Colitis   . Early age-related macular degeneration   . Hernia   . Hypertension   . Infertility, female   . Menopausal symptoms   . Rosacea   . Thyroid disease 1968   hypothyroidism treated with meds for short time then all labs normal    Past Surgical History:  Procedure Laterality Date  . bone spur Left   . COLONOSCOPY  11/2011   polyp, ulcerative coliits inactive  . COLONOSCOPY  2005  . COLONOSCOPY  1970's   diagnosed with ulcerative colitis  . ESOPHAGOGASTRODUODENOSCOPY (EGD) WITH PROPOFOL N/A 11/20/2014   Procedure: ESOPHAGOGASTRODUODENOSCOPY (EGD) WITH PROPOFOL;  Surgeon: Laurence Spates,  MD;  Location: [facility];  Service: Endoscopy;  Laterality: N/A;  . INGUINAL HERNIA REPAIR Right 40 mos old  . LAPAROSCOPIC CHOLECYSTECTOMY  09/1998  . SAVORY DILATION N/A 11/20/2014   Procedure: SAVORY DILATION;  Surgeon: Laurence Spates, MD;  Location: [facility];  Service: Endoscopy;  Laterality: N/A;  . WISDOM TOOTH EXTRACTION  1968    There were no vitals filed for this visit.   Subjective Assessment - 05/30/20 1318    Subjective Pt reports that she is doing ok. Still has back/hip pain in mornings that does improve as she goes on with day. She finds that sitting upright in good chair or in car does help it some. She reports that still a little off balance at times but found the other day that she tripped in restaurant  but was able to take some steps to catch herself that impressed her.    Patient is accompained by: Family member   Rick: husband   Pertinent History Tx myelopathy, HTN, macular degeneration, B cataract extraction (2021), hypothyroidism years ago, anxiety, arthritis.    Patient Stated Goals To decr. pain    Currently in Pain? Yes    Pain Score 1     Pain Location Buttocks    Pain Orientation Right;Left    Pain Descriptors / Indicators Aching    Pain Type Chronic pain    Pain Onset More than a month  ago    Pain Frequency Intermittent              [facility] PT Assessment - 05/30/20 1322      Functional Gait  Assessment   Gait assessed  Yes    Gait Level Surface Walks 20 ft in less than 7 sec but greater than 5.5 sec, uses assistive device, slower speed, mild gait deviations, or deviates 6-10 in outside of the 12 in walkway width.    Change in Gait Speed Able to change speed, demonstrates mild gait deviations, deviates 6-10 in outside of the 12 in walkway width, or no gait deviations, unable to achieve a major change in velocity, or uses a change in velocity, or uses an assistive device.    Gait with Horizontal Head Turns Performs head turns smoothly with no change in  gait. Deviates no more than 6 in outside 12 in walkway width    Gait with Vertical Head Turns Performs head turns with no change in gait. Deviates no more than 6 in outside 12 in walkway width.    Gait and Pivot Turn Pivot turns safely within 3 sec and stops quickly with no loss of balance.    Step Over Obstacle Is able to step over one shoe box (4.5 in total height) without changing gait speed. No evidence of imbalance.    Gait with Narrow Base of Support Ambulates less than 4 steps heel to toe or cannot perform without assistance.    Gait with Eyes Closed Walks 20 ft, uses assistive device, slower speed, mild gait deviations, deviates 6-10 in outside 12 in walkway width. Ambulates 20 ft in less than 9 sec but greater than 7 sec.    Ambulating Backwards Walks 20 ft, uses assistive device, slower speed, mild gait deviations, deviates 6-10 in outside 12 in walkway width.    Steps Alternating feet, must use rail.    Total Score 21                         [facility] Adult PT Treatment/Exercise - 05/30/20 1322      Ambulation/Gait   Ambulation/Gait Yes    Ambulation/Gait Assistance 5: Supervision    Ambulation/Gait Assistance Details Pt has decreased stance time on right and comes down harder on left foot scuffing at times.    Ambulation Distance (Feet) 460 Feet    Assistive device None    Gait Pattern Step-through pattern;Decreased stance time - right;Decreased step length - left    Ambulation Surface Level;Indoor      Exercises   Exercises Other Exercises    Other Exercises  Pt reviewed HEP from medbridge with pt as noted below.            Supine Transversus Abdominis Bracing - Hands on Stomach - 3 x daily - 7 x weekly - 2 sets - 10 reps - 30 sec hold Supine Transversus Abdominis Bracing with Leg Extension - 2 x daily - 5 x weekly - 1 sets - 10 reps Supine Transversus Abdominis Bracing with Double Leg Fallout - 2 x daily - 5 x weekly - 1 sets - 10 reps Beginner Bridge - 2 x  daily - 5 x weekly - 1 sets - 10 reps Seated Table Hamstring Stretch - 1 x daily - 7 x weekly - 1 sets - 2-3 reps - 30-60 hold Wide Stance with Head Rotation on Foam Pad - 1 x daily - 5 x weekly - 1 sets -  10 reps Wide Stance with Head Nods on Foam Pad - 1 x daily - 5 x weekly - 1 sets - 10 reps Wide Stance with Eyes Closed on Foam Pad - 1 x daily - 5 x weekly - 1 sets - 2 reps - 20 secs hold Seated March with Opposite Arm Flexion on Swiss Ball - 1 x daily - 5 x weekly - 1 sets - 10 reps Tandem Stance in Corner - 2 x daily - 5 x weekly - 1 sets - 3 reps - 30 sec hold Seated lateral trunk stretch - 3 x daily - 7 x weekly - 1 sets - 4 reps - 20 sec hold Modified Thomas Stretch - 3 x daily - 7 x weekly - 1 sets - 4 reps - 20-30 sec hold       PT Education - 05/30/20 1523    Education Details Pt was instructed in PT discharge plan. Discussed results of testing today with no significant change since last assessment. Reviewed HEP and instructed to continue to work on. Also reviewed back safety with pt including avoiding excessive extension with her stenosis, avoiding repetitive bending/lifting/twisting, safe lifting technique with using legs and keeping back straight.    Person(s) Educated Patient;Spouse    Methods Explanation;Demonstration    Comprehension Verbalized understanding;Returned demonstration            PT Short Term Goals - 05/02/20 1915      PT SHORT TERM GOAL #1   Title STGs=LTGs             PT Long Term Goals - 05/30/20 1322      PT LONG TERM GOAL #1   Title Pt will be independent with progressive HEP for balance and core stabilization to continue gains on own.    Baseline PT reviewed HEP with patient and she was able to perform correctly.    Time 4    Period Weeks    Status Achieved      PT LONG TERM GOAL #2   Title Pt will report LBP/hip pain at worst during ADLs is </=3/10 to improve QOL and perform ADLs safely.    Baseline 10/10. 05/02/20 Pt reports that pain  varies during the day. Earlier in day pain up to 5/10. Later in day she doesn't have much pain at all. 05/30/20 4/10 at worst.    Time 4    Period Weeks    Status Partially Met      PT LONG TERM GOAL #3   Title Pt will be instructed in ways to better manage her back pain to improve function and verbalize understanding.    Baseline Pt has been instructed in ways to manage her back pain.    Time 4    Period Weeks    Status Achieved      PT LONG TERM GOAL #4   Title Pt will improve FGA score to >/=27/30 to decr. falls risk.    Baseline 13/30. 05/02/20 25/30. 05/30/20 21/30    Time 4    Period Weeks    Status Not Met                 Plan - 05/30/20 1526    Clinical Impression Statement PT reassessed LTGs today. Pt has been instructed in HEP to continue to work on for core stabilization, stretching and balance. Pt has been instructed in ways to protect back to prevent future injury. While pt's pain in back/hips has improved since initial  evaluation from 10/10 to 4/10 at worst she has had no real change the last month. Continues to have pain in early part of day. Pt has been encouraged to continue with HEP. Pt's balance has improved as well but still fall risk based on FGA of 21/30. No change in score in the last month. Pt is using cane to walk in community with husband. PT discharging at this time.    Personal Factors and Comorbidities Age;Comorbidity 3+;Time since onset of injury/illness/exacerbation;Past/Current Experience    Comorbidities Tx myelopathy, HTN, macular degeneration, B cataract extraction (2021), hypothyroidism years ago, anxiety, arthritis.    Examination-Activity Limitations Bend;Bathing;Locomotion Level;Transfers;Reach Overhead;Carry;Dressing;Stand;Stairs;Squat    Examination-Participation Restrictions Cleaning;Community Activity;Driving;Interpersonal Relationship;Laundry;Meal Prep    Stability/Clinical Decision Making Evolving/Moderate complexity    Rehab Potential Good     PT Frequency 2x / week   followed by 1x/week for 2 weeks.   PT Duration 2 weeks    PT Treatment/Interventions ADLs/Self Care Home Management;Aquatic Therapy;Biofeedback;Electrical Stimulation;DME Instruction;Balance training;Therapeutic exercise;Therapeutic activities;Patient/family education;Functional mobility training;Stair training;Gait training;Neuromuscular re-education;Manual techniques;Vestibular;Orthotic Fit/Training    PT Next Visit Plan Discharge today    Consulted and Agree with Plan of Care Patient;Family member/caregiver           Patient will benefit from skilled therapeutic intervention in order to improve the following deficits and impairments:  Abnormal gait,Decreased coordination,Decreased range of motion,Decreased endurance,Decreased knowledge of use of DME,Decreased balance,Decreased mobility,Decreased strength,Postural dysfunction,Impaired flexibility,Pain  Visit Diagnosis: Other abnormalities of gait and mobility  Muscle weakness (generalized)  Chronic midline low back pain with bilateral sciatica     Problem List Patient Active Problem List   Diagnosis Date Noted  . Neurogenic bladder 06/29/2018  . Essential hypertension   . Cervical spondylosis without myelopathy   . Steroid-induced hyperglycemia   . Postoperative pain   . Hyponatremia   . Leucocytosis   . Thoracic myelopathy 04/22/2018  . ALLERGIC RHINITIS 01/19/2007    Electa Sniff, PT, DPT, NCS 05/30/2020, 6:30 PM  [facility] [address] [place], [place], [address] Phone: [phone]   Fax:  [phone]  Name: TAMECCA ARTIGA MRN: [id number] Date of Birth: Oct 20, 1940

## 2020-06-04 DIAGNOSIS — I10 Essential (primary) hypertension: Secondary | ICD-10-CM | POA: Diagnosis not present

## 2020-06-04 DIAGNOSIS — R413 Other amnesia: Secondary | ICD-10-CM | POA: Diagnosis not present

## 2020-06-04 DIAGNOSIS — R269 Unspecified abnormalities of gait and mobility: Secondary | ICD-10-CM | POA: Diagnosis not present

## 2020-07-30 DIAGNOSIS — R921 Mammographic calcification found on diagnostic imaging of breast: Secondary | ICD-10-CM | POA: Diagnosis not present

## 2020-07-30 DIAGNOSIS — R928 Other abnormal and inconclusive findings on diagnostic imaging of breast: Secondary | ICD-10-CM | POA: Diagnosis not present

## 2020-08-27 DIAGNOSIS — I10 Essential (primary) hypertension: Secondary | ICD-10-CM | POA: Diagnosis not present

## 2020-09-24 DIAGNOSIS — H353132 Nonexudative age-related macular degeneration, bilateral, intermediate dry stage: Secondary | ICD-10-CM | POA: Diagnosis not present

## 2020-09-24 DIAGNOSIS — H43813 Vitreous degeneration, bilateral: Secondary | ICD-10-CM | POA: Diagnosis not present

## 2020-10-07 DIAGNOSIS — H353132 Nonexudative age-related macular degeneration, bilateral, intermediate dry stage: Secondary | ICD-10-CM | POA: Diagnosis not present

## 2020-12-04 DIAGNOSIS — Z79899 Other long term (current) drug therapy: Secondary | ICD-10-CM | POA: Diagnosis not present

## 2020-12-04 DIAGNOSIS — I10 Essential (primary) hypertension: Secondary | ICD-10-CM | POA: Diagnosis not present

## 2020-12-04 DIAGNOSIS — R5383 Other fatigue: Secondary | ICD-10-CM | POA: Diagnosis not present

## 2021-03-14 DIAGNOSIS — Z0000 Encounter for general adult medical examination without abnormal findings: Secondary | ICD-10-CM | POA: Diagnosis not present

## 2021-03-14 DIAGNOSIS — E7800 Pure hypercholesterolemia, unspecified: Secondary | ICD-10-CM | POA: Diagnosis not present

## 2021-03-14 DIAGNOSIS — Z1389 Encounter for screening for other disorder: Secondary | ICD-10-CM | POA: Diagnosis not present

## 2021-03-14 DIAGNOSIS — I10 Essential (primary) hypertension: Secondary | ICD-10-CM | POA: Diagnosis not present

## 2021-03-14 DIAGNOSIS — Z79899 Other long term (current) drug therapy: Secondary | ICD-10-CM | POA: Diagnosis not present

## 2021-03-26 DIAGNOSIS — H353132 Nonexudative age-related macular degeneration, bilateral, intermediate dry stage: Secondary | ICD-10-CM | POA: Diagnosis not present

## 2021-08-01 DIAGNOSIS — Z1231 Encounter for screening mammogram for malignant neoplasm of breast: Secondary | ICD-10-CM | POA: Diagnosis not present

## 2021-08-01 DIAGNOSIS — M85852 Other specified disorders of bone density and structure, left thigh: Secondary | ICD-10-CM | POA: Diagnosis not present

## 2021-08-01 DIAGNOSIS — M85851 Other specified disorders of bone density and structure, right thigh: Secondary | ICD-10-CM | POA: Diagnosis not present

## 2021-09-24 DIAGNOSIS — H43813 Vitreous degeneration, bilateral: Secondary | ICD-10-CM | POA: Diagnosis not present

## 2021-09-24 DIAGNOSIS — H353132 Nonexudative age-related macular degeneration, bilateral, intermediate dry stage: Secondary | ICD-10-CM | POA: Diagnosis not present

## 2021-09-29 DIAGNOSIS — R1319 Other dysphagia: Secondary | ICD-10-CM | POA: Diagnosis not present

## 2021-09-29 DIAGNOSIS — I10 Essential (primary) hypertension: Secondary | ICD-10-CM | POA: Diagnosis not present

## 2021-09-29 DIAGNOSIS — M8589 Other specified disorders of bone density and structure, multiple sites: Secondary | ICD-10-CM | POA: Diagnosis not present

## 2022-02-04 DIAGNOSIS — H353132 Nonexudative age-related macular degeneration, bilateral, intermediate dry stage: Secondary | ICD-10-CM | POA: Diagnosis not present

## 2022-03-24 IMAGING — MR MR LUMBAR SPINE W/O CM
4 of 5 series · 27 of 48 positions shown · non-contrast
Comparison: 04/30/2015

CLINICAL DATA: Low back pain with bilateral hip pain

EXAM:
MRI LUMBAR SPINE WITHOUT CONTRAST
TECHNIQUE: Multiplanar, multisequence MR imaging of the lumbar spine was
performed. No intravenous contrast was administered.

[Series 2: T2 · sagittal · 4.0mm · 1.09mm/px · 6 of 17 slices shown (1 of 2)]
[im 1/17]
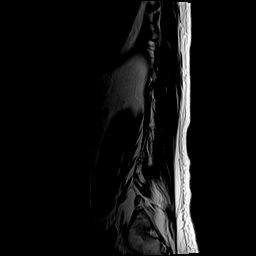
[im 4/17]
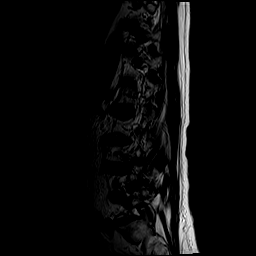
[im 7/17]
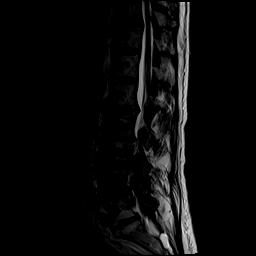
[im 10/17]
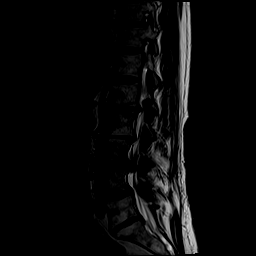
[im 13/17]
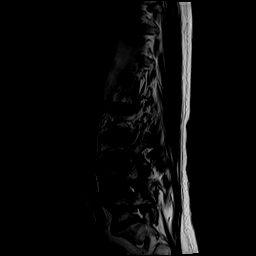
[im 17/17]
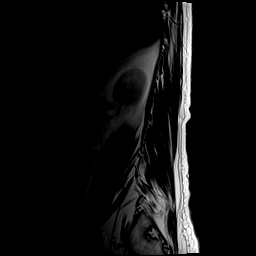

[Series 4: T1 · sagittal · 4.0mm · 1.09mm/px · 6 of 17 slices shown (1 of 2)]
[im 1/17]
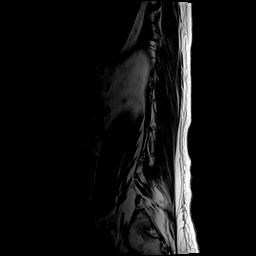
[im 4/17]
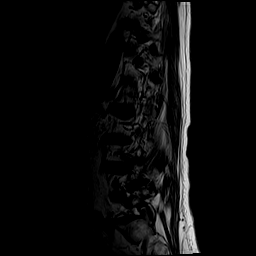
[im 7/17]
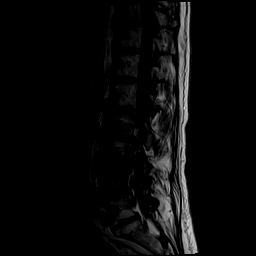
[im 10/17]
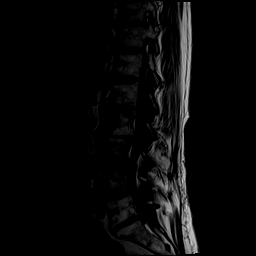
[im 13/17]
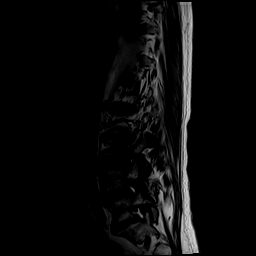
[im 17/17]
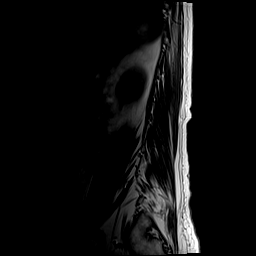

[Series 5: T2 · axial · 4.0mm · 0.39mm/px · z∈[-106,+116]mm · 9 of 45 slices shown (2 of 2)]
[im 1/45]
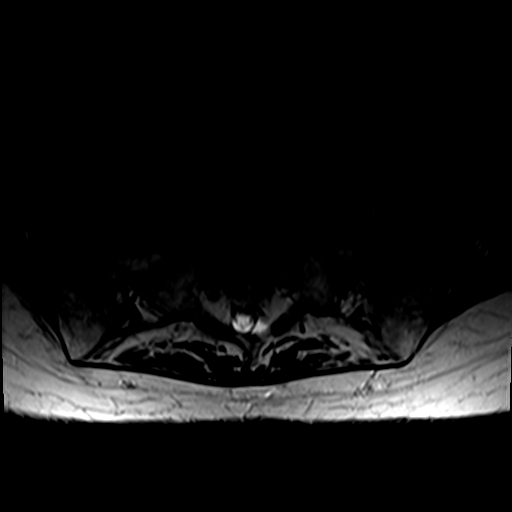
[im 7/45]
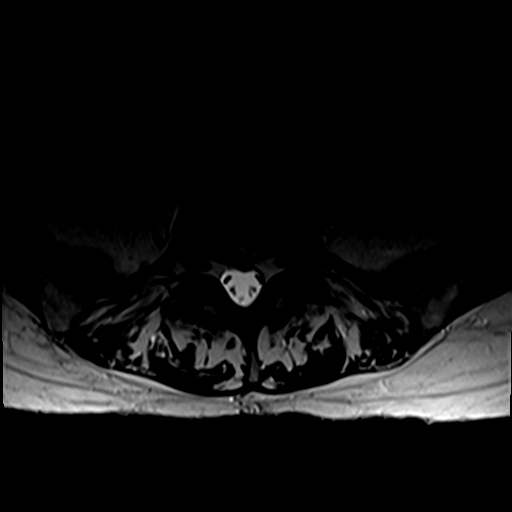
[im 13/45]
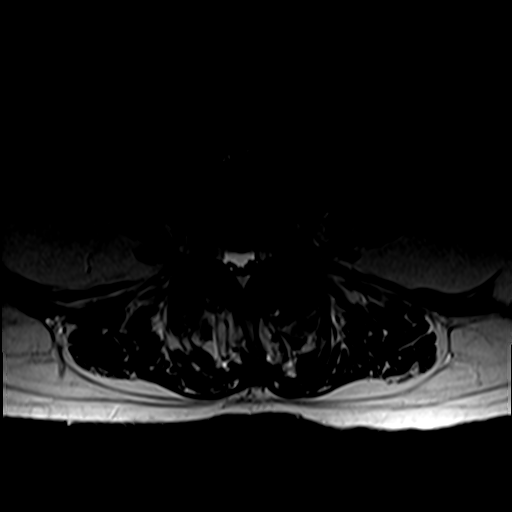
[im 19/45]
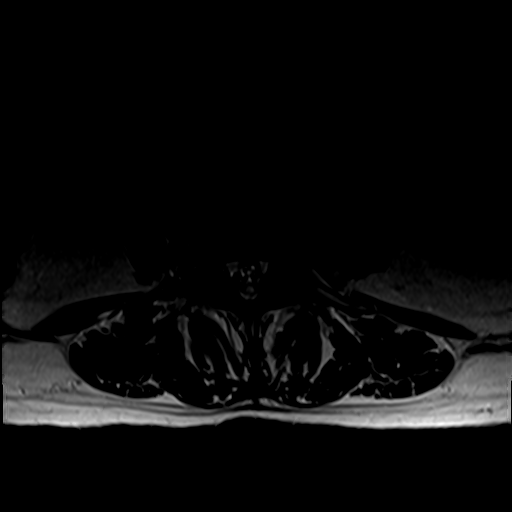
[im 23/45]
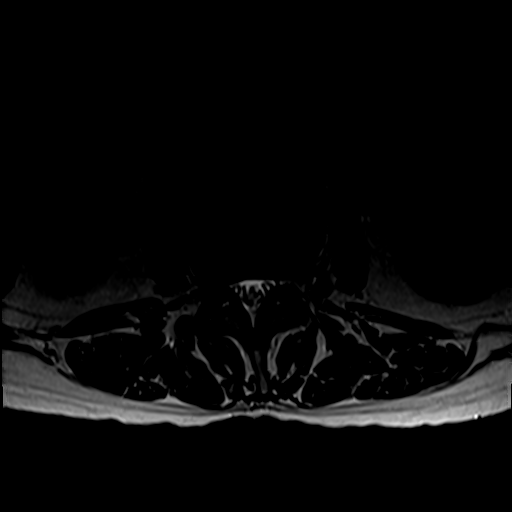
[im 26/45]
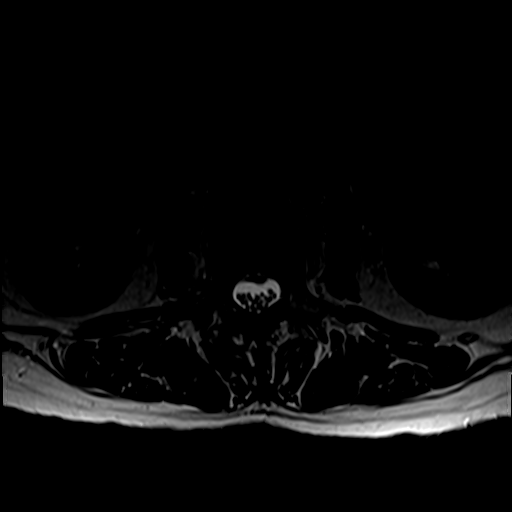
[im 32/45]
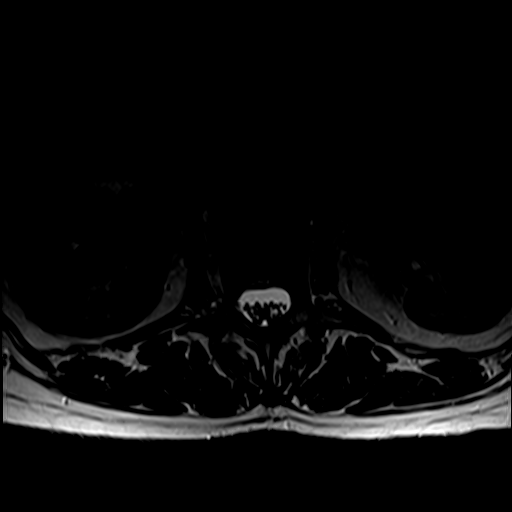
[im 38/45]
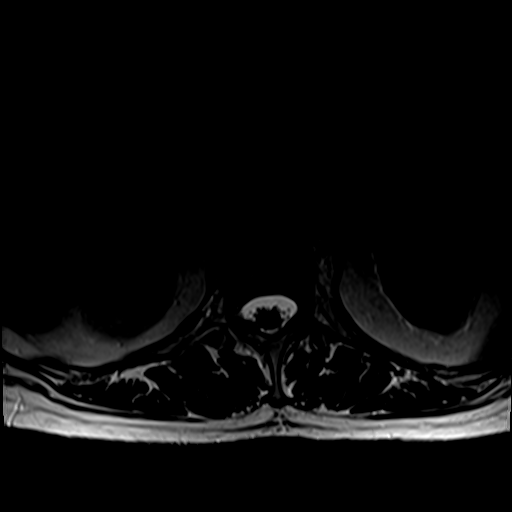
[im 45/45]
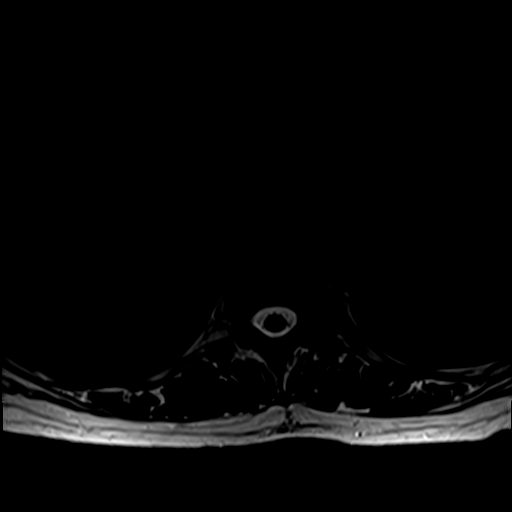

[Series 6: T1 · axial · 4.0mm · 0.39mm/px · z∈[-106,+82]mm · 6 of 45 slices shown (2 of 2)]
[im 1/45]
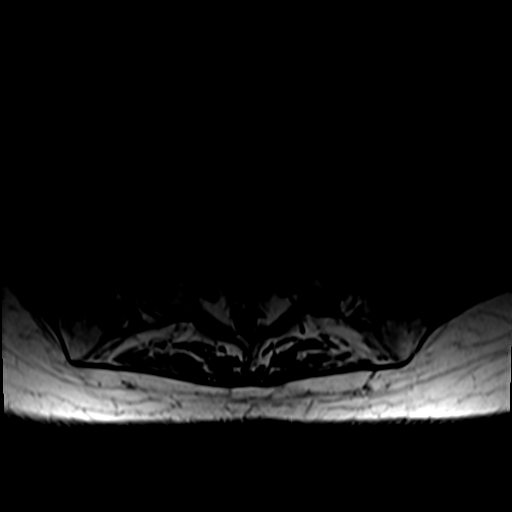
[im 7/45]
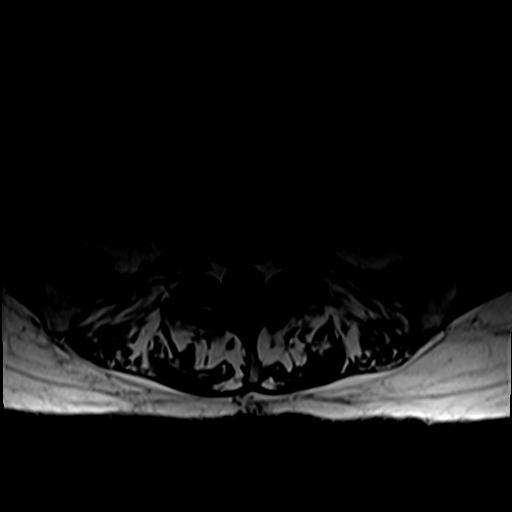
[im 13/45]
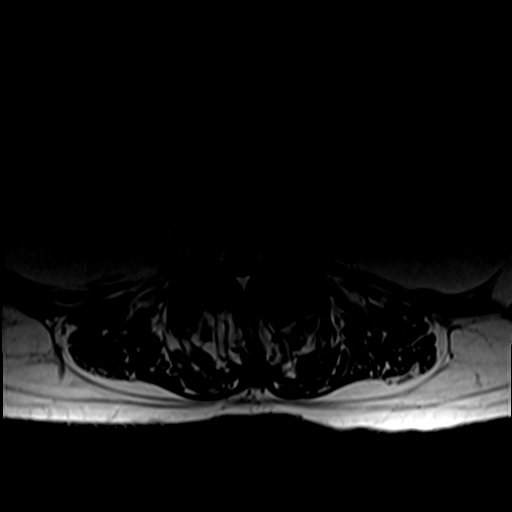
[im 19/45]
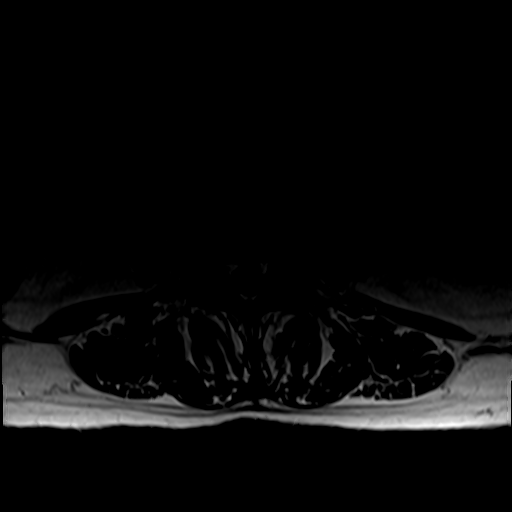
[im 23/45]
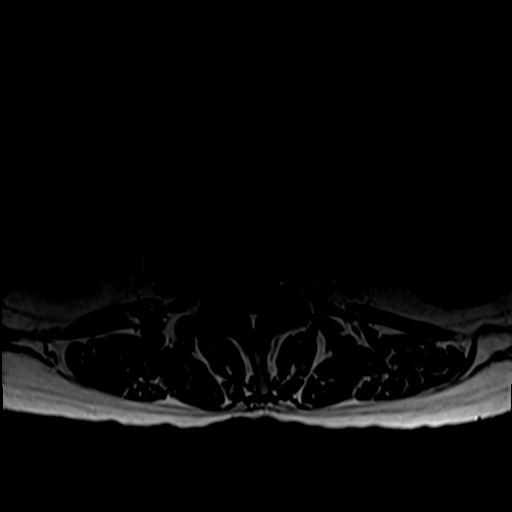
[im 38/45]
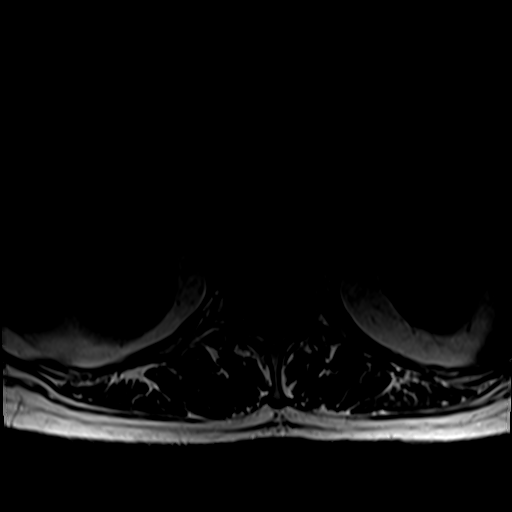

[27 of 48 positions shown; findings below may reference images not displayed]

FINDINGS: Segmentation:  5 lumbar type vertebrae

Alignment: Grade 1 anterolisthesis at L4-5, chronic and facet
mediated.

Vertebrae: No fracture, evidence of discitis, or bone lesion. Marrow
edema associated with the bilateral L4-5 facets.

Conus medullaris and cauda equina: Conus extends to the L1-2 level.
Conus is normal. There is cauda equina redundancy due to the L3-4
spinal stenosis

Paraspinal and other soft tissues: Mild periarticular edema at L4-5.

Disc levels:

T12- L1: Minor facet spurring.  No neural impingement

L1-L2: Disc narrowing and bulging with endplate ridging. Small
downward extending disc extrusion best seen on sagittal images.
Degenerative facet spurring on the right more than left. Right
foraminal narrowing which appears mild on sagittal images and
moderate on axial slices.

L2-L3: Disc narrowing and endplate degeneration. Circumferential
disc bulging and ridging. Mild facet spurring. Mild spinal stenosis

L3-L4: Disc narrowing and bulging with superimposed broad central
protrusion. Facet spurring and ligamentum flavum thickening.
Advanced and compressive spinal stenosis with no residual CSF seen.
Biforaminal impingement, severe on the left

L4-L5: Facet osteoarthritis with marrow edema and anterolisthesis.
There is a small left anterior synovial cyst seen on sagittal images
measuring 3 mm. The disc is narrowed and bulging. Moderate spinal
stenosis with impingement at both subarticular recesses. Moderate
left foraminal impingement

L5-S1:Disc narrowing and endplate ridging. Mild disc bulging and
facet spurring. No compressive stenosis.
IMPRESSION: 1. Multilevel disc and facet degeneration with progression since
6649, especially at L3-4 where there is now severe spinal stenosis
and biforaminal impingement.
2. L4-5 notably advanced facet osteoarthritis with marrow edema and
anterolisthesis. Moderate spinal stenosis with bilateral L5
impingement in the subarticular recesses. Left foraminal impingement
also present at this level.

## 2022-04-10 DIAGNOSIS — M8589 Other specified disorders of bone density and structure, multiple sites: Secondary | ICD-10-CM | POA: Diagnosis not present

## 2022-04-10 DIAGNOSIS — Z1331 Encounter for screening for depression: Secondary | ICD-10-CM | POA: Diagnosis not present

## 2022-04-10 DIAGNOSIS — E559 Vitamin D deficiency, unspecified: Secondary | ICD-10-CM | POA: Diagnosis not present

## 2022-04-10 DIAGNOSIS — I10 Essential (primary) hypertension: Secondary | ICD-10-CM | POA: Diagnosis not present

## 2022-04-10 DIAGNOSIS — H3530 Unspecified macular degeneration: Secondary | ICD-10-CM | POA: Diagnosis not present

## 2022-04-10 DIAGNOSIS — Z79899 Other long term (current) drug therapy: Secondary | ICD-10-CM | POA: Diagnosis not present

## 2022-04-10 DIAGNOSIS — R5382 Chronic fatigue, unspecified: Secondary | ICD-10-CM | POA: Diagnosis not present

## 2022-04-10 DIAGNOSIS — E7800 Pure hypercholesterolemia, unspecified: Secondary | ICD-10-CM | POA: Diagnosis not present

## 2022-04-10 DIAGNOSIS — Z0000 Encounter for general adult medical examination without abnormal findings: Secondary | ICD-10-CM | POA: Diagnosis not present

## 2022-04-10 DIAGNOSIS — M48062 Spinal stenosis, lumbar region with neurogenic claudication: Secondary | ICD-10-CM | POA: Diagnosis not present

## 2022-04-14 DIAGNOSIS — H26493 Other secondary cataract, bilateral: Secondary | ICD-10-CM | POA: Diagnosis not present

## 2022-04-14 DIAGNOSIS — H353132 Nonexudative age-related macular degeneration, bilateral, intermediate dry stage: Secondary | ICD-10-CM | POA: Diagnosis not present

## 2022-04-14 DIAGNOSIS — H43813 Vitreous degeneration, bilateral: Secondary | ICD-10-CM | POA: Diagnosis not present

## 2022-07-09 DIAGNOSIS — H02831 Dermatochalasis of right upper eyelid: Secondary | ICD-10-CM | POA: Diagnosis not present

## 2022-07-09 DIAGNOSIS — H18413 Arcus senilis, bilateral: Secondary | ICD-10-CM | POA: Diagnosis not present

## 2022-07-09 DIAGNOSIS — H353132 Nonexudative age-related macular degeneration, bilateral, intermediate dry stage: Secondary | ICD-10-CM | POA: Diagnosis not present

## 2022-07-09 DIAGNOSIS — Z961 Presence of intraocular lens: Secondary | ICD-10-CM | POA: Diagnosis not present

## 2022-07-15 DIAGNOSIS — Z961 Presence of intraocular lens: Secondary | ICD-10-CM | POA: Diagnosis not present

## 2022-07-22 DIAGNOSIS — S0101XA Laceration without foreign body of scalp, initial encounter: Secondary | ICD-10-CM | POA: Diagnosis not present

## 2022-07-22 DIAGNOSIS — S0083XA Contusion of other part of head, initial encounter: Secondary | ICD-10-CM | POA: Diagnosis not present

## 2022-07-22 DIAGNOSIS — S01512A Laceration without foreign body of oral cavity, initial encounter: Secondary | ICD-10-CM | POA: Diagnosis not present

## 2022-07-22 DIAGNOSIS — S01511A Laceration without foreign body of lip, initial encounter: Secondary | ICD-10-CM | POA: Diagnosis not present

## 2022-07-22 DIAGNOSIS — S0990XA Unspecified injury of head, initial encounter: Secondary | ICD-10-CM | POA: Diagnosis not present

## 2022-07-22 DIAGNOSIS — M25512 Pain in left shoulder: Secondary | ICD-10-CM | POA: Diagnosis not present

## 2022-07-22 DIAGNOSIS — M25552 Pain in left hip: Secondary | ICD-10-CM | POA: Diagnosis not present

## 2022-07-30 DIAGNOSIS — Z4802 Encounter for removal of sutures: Secondary | ICD-10-CM | POA: Diagnosis not present

## 2022-07-30 DIAGNOSIS — S4992XA Unspecified injury of left shoulder and upper arm, initial encounter: Secondary | ICD-10-CM | POA: Diagnosis not present

## 2022-08-14 DIAGNOSIS — Z1231 Encounter for screening mammogram for malignant neoplasm of breast: Secondary | ICD-10-CM | POA: Diagnosis not present

## 2022-10-16 DIAGNOSIS — H43813 Vitreous degeneration, bilateral: Secondary | ICD-10-CM | POA: Diagnosis not present

## 2022-10-16 DIAGNOSIS — H353132 Nonexudative age-related macular degeneration, bilateral, intermediate dry stage: Secondary | ICD-10-CM | POA: Diagnosis not present

## 2022-10-16 DIAGNOSIS — Z961 Presence of intraocular lens: Secondary | ICD-10-CM | POA: Diagnosis not present

## 2022-11-05 DIAGNOSIS — M549 Dorsalgia, unspecified: Secondary | ICD-10-CM | POA: Diagnosis not present

## 2022-11-05 DIAGNOSIS — M48062 Spinal stenosis, lumbar region with neurogenic claudication: Secondary | ICD-10-CM | POA: Diagnosis not present

## 2022-11-30 DIAGNOSIS — M4714 Other spondylosis with myelopathy, thoracic region: Secondary | ICD-10-CM | POA: Diagnosis not present

## 2022-11-30 DIAGNOSIS — M48062 Spinal stenosis, lumbar region with neurogenic claudication: Secondary | ICD-10-CM | POA: Diagnosis not present

## 2022-11-30 DIAGNOSIS — R2681 Unsteadiness on feet: Secondary | ICD-10-CM | POA: Diagnosis not present

## 2022-11-30 DIAGNOSIS — M4316 Spondylolisthesis, lumbar region: Secondary | ICD-10-CM | POA: Diagnosis not present

## 2022-12-01 ENCOUNTER — Other Ambulatory Visit: Payer: Self-pay | Admitting: Neurosurgery

## 2022-12-01 DIAGNOSIS — M4316 Spondylolisthesis, lumbar region: Secondary | ICD-10-CM

## 2022-12-01 DIAGNOSIS — M4714 Other spondylosis with myelopathy, thoracic region: Secondary | ICD-10-CM

## 2022-12-09 ENCOUNTER — Other Ambulatory Visit: Payer: Medicare PPO

## 2022-12-15 DIAGNOSIS — M4804 Spinal stenosis, thoracic region: Secondary | ICD-10-CM | POA: Diagnosis not present

## 2022-12-15 DIAGNOSIS — M47817 Spondylosis without myelopathy or radiculopathy, lumbosacral region: Secondary | ICD-10-CM | POA: Diagnosis not present

## 2022-12-15 DIAGNOSIS — M4316 Spondylolisthesis, lumbar region: Secondary | ICD-10-CM | POA: Diagnosis not present

## 2022-12-15 DIAGNOSIS — M4714 Other spondylosis with myelopathy, thoracic region: Secondary | ICD-10-CM | POA: Diagnosis not present

## 2022-12-15 DIAGNOSIS — M5126 Other intervertebral disc displacement, lumbar region: Secondary | ICD-10-CM | POA: Diagnosis not present

## 2022-12-15 DIAGNOSIS — M48061 Spinal stenosis, lumbar region without neurogenic claudication: Secondary | ICD-10-CM | POA: Diagnosis not present

## 2022-12-21 DIAGNOSIS — M4316 Spondylolisthesis, lumbar region: Secondary | ICD-10-CM | POA: Diagnosis not present

## 2023-01-01 ENCOUNTER — Other Ambulatory Visit: Payer: Self-pay | Admitting: Neurosurgery

## 2023-01-03 ENCOUNTER — Other Ambulatory Visit: Payer: Medicare PPO

## 2023-01-08 ENCOUNTER — Other Ambulatory Visit: Payer: Self-pay | Admitting: Neurosurgery

## 2023-01-19 NOTE — Pre-Procedure Instructions (Signed)
Surgical Instructions   Your procedure is scheduled on Thursday, October 3rd. Report to [facility] Main Entrance "A" at 10:30 A.M., then check in with the Admitting office. Any questions or running late day of surgery: call [phone]  Questions prior to your surgery date: call [phone], Monday-Friday, 8am-4pm. If you experience any cold or flu symptoms such as cough, fever, chills, shortness of breath, etc. between now and your scheduled surgery, please notify us at the above number.     Remember:  Do not eat or drink after midnight the night before your surgery     Take these medicines the morning of surgery with A SIP OF WATER  amLODipine (NORVASC)  ezetimibe (ZETIA)   May take these medicines IF NEEDED: acetaminophen (TYLENOL)  Glycerin-Hypromellose-PEG 400 (DRY EYE RELIEF DROPS OP)    One week prior to surgery, STOP taking any Aspirin (unless otherwise instructed by your surgeon) Aleve, Naproxen, Ibuprofen, Motrin, Advil, Goody's, BC's, all herbal medications, fish oil, and non-prescription vitamins.                     Do NOT Smoke (Tobacco/Vaping) for 24 hours prior to your procedure.  If you use a CPAP at night, you may bring your mask/headgear for your overnight stay.   You will be asked to remove any contacts, glasses, piercing's, hearing aid's, dentures/partials prior to surgery. Please bring cases for these items if needed.    Patients discharged the day of surgery will not be allowed to drive home, and someone needs to stay with them for 24 hours.  SURGICAL WAITING ROOM VISITATION Patients may have no more than 2 support people in the waiting area - these visitors may rotate.   Pre-op nurse will coordinate an appropriate time for 1 ADULT support person, who may not rotate, to accompany patient in pre-op.  Children under the age of 82 must have an adult with them who is not the patient and must remain in the main waiting area with an adult.  If the patient  needs to stay at the hospital during part of their recovery, the visitor guidelines for inpatient rooms apply.  Please refer to the [facility] website for the visitor guidelines for any additional information.   If you received a COVID test during your pre-op visit  it is requested that you wear a mask when out in public, stay away from anyone that may not be feeling well and notify your surgeon if you develop symptoms. If you have been in contact with anyone that has tested positive in the last 10 days please notify you surgeon.      Pre-operative 5 CHG Bathing Instructions   You can play a key role in reducing the risk of infection after surgery. Your skin needs to be as free of germs as possible. You can reduce the number of germs on your skin by washing with CHG (chlorhexidine gluconate) soap before surgery. CHG is an antiseptic soap that kills germs and continues to kill germs even after washing.   DO NOT use if you have an allergy to chlorhexidine/CHG or antibacterial soaps. If your skin becomes reddened or irritated, stop using the CHG and notify one of our RNs at [phone].   Please shower with the CHG soap starting 4 days before surgery using the following schedule:     Please keep in mind the following:  DO NOT shave, including legs and underarms, starting the day of your first shower.   You  may shave your face at any point before/day of surgery.  Place clean sheets on your bed the day you start using CHG soap. Use a clean washcloth (not used since being washed) for each shower. DO NOT sleep with pets once you start using the CHG.   CHG Shower Instructions:  Wash your face and private area with normal soap. If you choose to wash your hair, wash first with your normal shampoo.  After you use shampoo/soap, rinse your hair and body thoroughly to remove shampoo/soap residue.  Turn the water OFF and apply about 3 tablespoons (45 ml) of CHG soap to a CLEAN washcloth.  Apply CHG  soap ONLY FROM YOUR NECK DOWN TO YOUR TOES (washing for 3-5 minutes)  DO NOT use CHG soap on face, private areas, open wounds, or sores.  Pay special attention to the area where your surgery is being performed.  If you are having back surgery, having someone wash your back for you may be helpful. Wait 2 minutes after CHG soap is applied, then you may rinse off the CHG soap.  Pat dry with a clean towel  Put on clean clothes/pajamas   If you choose to wear lotion, please use ONLY the CHG-compatible lotions on the back of this paper.   Additional instructions for the day of surgery: DO NOT APPLY any lotions, deodorants, cologne, or perfumes.   Do not bring valuables to the hospital. [facility] is not responsible for any belongings/valuables. Do not wear nail polish, gel polish, artificial nails, or any other type of covering on natural nails (fingers and toes) Do not wear jewelry or makeup Put on clean/comfortable clothes.  Please brush your teeth.  Ask your nurse before applying any prescription medications to the skin.     CHG Compatible Lotions   Aveeno Moisturizing lotion  Cetaphil Moisturizing Cream  Cetaphil Moisturizing Lotion  Clairol Herbal Essence Moisturizing Lotion, Dry Skin  Clairol Herbal Essence Moisturizing Lotion, Extra Dry Skin  Clairol Herbal Essence Moisturizing Lotion, Normal Skin  Curel Age Defying Therapeutic Moisturizing Lotion with Alpha Hydroxy  Curel Extreme Care Body Lotion  Curel Soothing Hands Moisturizing Hand Lotion  Curel Therapeutic Moisturizing Cream, Fragrance-Free  Curel Therapeutic Moisturizing Lotion, Fragrance-Free  Curel Therapeutic Moisturizing Lotion, Original Formula  Eucerin Daily Replenishing Lotion  Eucerin Dry Skin Therapy Plus Alpha Hydroxy Crme  Eucerin Dry Skin Therapy Plus Alpha Hydroxy Lotion  Eucerin Original Crme  Eucerin Original Lotion  Eucerin Plus Crme Eucerin Plus Lotion  Eucerin TriLipid Replenishing Lotion  Keri  Anti-Bacterial Hand Lotion  Keri Deep Conditioning Original Lotion Dry Skin Formula Softly Scented  Keri Deep Conditioning Original Lotion, Fragrance Free Sensitive Skin Formula  Keri Lotion Fast Absorbing Fragrance Free Sensitive Skin Formula  Keri Lotion Fast Absorbing Softly Scented Dry Skin Formula  Keri Original Lotion  Keri Skin Renewal Lotion Keri Silky Smooth Lotion  Keri Silky Smooth Sensitive Skin Lotion  Nivea Body Creamy Conditioning Oil  Nivea Body Extra Enriched Lotion  Nivea Body Original Lotion  Nivea Body Sheer Moisturizing Lotion Nivea Crme  Nivea Skin Firming Lotion  NutraDerm 30 Skin Lotion  NutraDerm Skin Lotion  NutraDerm Therapeutic Skin Cream  NutraDerm Therapeutic Skin Lotion  ProShield Protective Hand Cream  Provon moisturizing lotion  Please read over the following fact sheets that you were given.

## 2023-01-20 ENCOUNTER — Encounter (HOSPITAL_COMMUNITY)
Admission: RE | Admit: 2023-01-20 | Discharge: 2023-01-20 | Disposition: A | Discharge status: Home / Self Care | Payer: Medicare PPO | Source: Ambulatory Visit | Attending: Neurosurgery | Admitting: Neurosurgery

## 2023-01-20 ENCOUNTER — Other Ambulatory Visit: Payer: Self-pay

## 2023-01-20 ENCOUNTER — Encounter (HOSPITAL_COMMUNITY): Payer: Self-pay

## 2023-01-20 VITALS — BP 164/76 | HR 84 | Temp 98.4°F | Resp 17 | Ht 66.0 in | Wt 141.9 lb

## 2023-01-20 DIAGNOSIS — Z01810 Encounter for preprocedural cardiovascular examination: Secondary | ICD-10-CM | POA: Insufficient documentation

## 2023-01-20 DIAGNOSIS — I10 Essential (primary) hypertension: Secondary | ICD-10-CM | POA: Insufficient documentation

## 2023-01-20 DIAGNOSIS — Z01818 Encounter for other preprocedural examination: Secondary | ICD-10-CM | POA: Diagnosis present

## 2023-01-20 DIAGNOSIS — Z01812 Encounter for preprocedural laboratory examination: Secondary | ICD-10-CM | POA: Insufficient documentation

## 2023-01-20 DIAGNOSIS — R9431 Abnormal electrocardiogram [ECG] [EKG]: Secondary | ICD-10-CM | POA: Diagnosis not present

## 2023-01-20 HISTORY — DX: Other seasonal allergic rhinitis: J30.2

## 2023-01-20 LAB — CBC
HCT: 39.2 % (ref 36.0–46.0)
Hemoglobin: 13.2 g/dL (ref 12.0–15.0)
MCH: 30.5 pg (ref 26.0–34.0)
MCHC: 33.7 g/dL (ref 30.0–36.0)
MCV: 90.5 fL (ref 80.0–100.0)
Platelets: 339 10*3/uL (ref 150–400)
RBC: 4.33 MIL/uL (ref 3.87–5.11)
RDW: 12.9 % (ref 11.5–15.5)
WBC: 7.9 10*3/uL (ref 4.0–10.5)
nRBC: 0 % (ref 0.0–0.2)

## 2023-01-20 LAB — BASIC METABOLIC PANEL
Anion gap: 10 (ref 5–15)
BUN: 9 mg/dL (ref 8–23)
CO2: 23 mmol/L (ref 22–32)
Calcium: 9.4 mg/dL (ref 8.9–10.3)
Chloride: 100 mmol/L (ref 98–111)
Creatinine, Ser: 0.54 mg/dL (ref 0.44–1.00)
GFR, Estimated: >60 mL/min (ref >60)
Glucose, Bld: 101 mg/dL — ABNORMAL HIGH (ref 70–99)
Potassium: 3.5 mmol/L (ref 3.5–5.1)
Sodium: 133 mmol/L — ABNORMAL LOW (ref 135–145)

## 2023-01-20 LAB — TYPE AND SCREEN
ABO/RH(D): A POS
Antibody Screen: NEGATIVE

## 2023-01-20 LAB — SURGICAL PCR SCREEN
MRSA, PCR: NEGATIVE
Staphylococcus aureus: NEGATIVE

## 2023-01-20 NOTE — Progress Notes (Signed)
PCP - Dr. Hillard Danker Cardiologist - denies  PPM/ICD - denies   Chest x-ray - 12/09/17 EKG - 01/20/23 Stress Test - denies ECHO - denies Cardiac Cath - denies  Sleep Study - denies   DM- denies  ASA/Blood Thinner Instructions: n/a   ERAS Protcol - no, NPO   COVID TEST- n/a   Anesthesia review: no  Patient denies shortness of breath, fever, cough and chest pain at PAT appointment   All instructions explained to the patient, with a verbal understanding of the material. Patient agrees to go over the instructions while at home for a better understanding.  The opportunity to ask questions was provided.

## 2023-01-28 ENCOUNTER — Observation Stay (HOSPITAL_COMMUNITY)
Admission: RE | Admit: 2023-01-28 | Discharge: 2023-01-29 | Disposition: A | Discharge status: Home / Self Care | Payer: Medicare PPO | Source: Ambulatory Visit | Attending: Neurosurgery | Admitting: Neurosurgery

## 2023-01-28 ENCOUNTER — Encounter (HOSPITAL_COMMUNITY): Payer: Self-pay | Admitting: Neurosurgery

## 2023-01-28 ENCOUNTER — Encounter (HOSPITAL_COMMUNITY)
Admission: RE | Disposition: A | Discharge status: Home / Self Care | Payer: Self-pay | Source: Ambulatory Visit | Attending: Neurosurgery

## 2023-01-28 ENCOUNTER — Other Ambulatory Visit: Payer: Self-pay

## 2023-01-28 ENCOUNTER — Ambulatory Visit (HOSPITAL_BASED_OUTPATIENT_CLINIC_OR_DEPARTMENT_OTHER): Payer: Self-pay | Admitting: Anesthesiology

## 2023-01-28 ENCOUNTER — Ambulatory Visit (HOSPITAL_COMMUNITY): Payer: Medicare PPO | Admitting: Physician Assistant

## 2023-01-28 ENCOUNTER — Ambulatory Visit (HOSPITAL_COMMUNITY): Payer: Medicare PPO

## 2023-01-28 DIAGNOSIS — M2548 Effusion, other site: Secondary | ICD-10-CM | POA: Diagnosis not present

## 2023-01-28 DIAGNOSIS — Z79899 Other long term (current) drug therapy: Secondary | ICD-10-CM | POA: Diagnosis not present

## 2023-01-28 DIAGNOSIS — M4316 Spondylolisthesis, lumbar region: Principal | ICD-10-CM

## 2023-01-28 DIAGNOSIS — Y838 Other surgical procedures as the cause of abnormal reaction of the patient, or of later complication, without mention of misadventure at the time of the procedure: Secondary | ICD-10-CM | POA: Diagnosis present

## 2023-01-28 DIAGNOSIS — Z8261 Family history of arthritis: Secondary | ICD-10-CM | POA: Diagnosis not present

## 2023-01-28 DIAGNOSIS — I10 Essential (primary) hypertension: Secondary | ICD-10-CM

## 2023-01-28 DIAGNOSIS — E876 Hypokalemia: Secondary | ICD-10-CM | POA: Diagnosis present

## 2023-01-28 DIAGNOSIS — L7622 Postprocedural hemorrhage and hematoma of skin and subcutaneous tissue following other procedure: Secondary | ICD-10-CM | POA: Diagnosis not present

## 2023-01-28 DIAGNOSIS — L7632 Postprocedural hematoma of skin and subcutaneous tissue following other procedure: Secondary | ICD-10-CM | POA: Diagnosis not present

## 2023-01-28 DIAGNOSIS — Z825 Family history of asthma and other chronic lower respiratory diseases: Secondary | ICD-10-CM | POA: Diagnosis not present

## 2023-01-28 DIAGNOSIS — N3289 Other specified disorders of bladder: Secondary | ICD-10-CM | POA: Diagnosis not present

## 2023-01-28 DIAGNOSIS — E039 Hypothyroidism, unspecified: Secondary | ICD-10-CM | POA: Diagnosis present

## 2023-01-28 DIAGNOSIS — G9761 Postprocedural hematoma of a nervous system organ or structure following a nervous system procedure: Secondary | ICD-10-CM | POA: Diagnosis present

## 2023-01-28 DIAGNOSIS — E871 Hypo-osmolality and hyponatremia: Secondary | ICD-10-CM | POA: Diagnosis present

## 2023-01-28 DIAGNOSIS — Z981 Arthrodesis status: Secondary | ICD-10-CM | POA: Diagnosis not present

## 2023-01-28 DIAGNOSIS — G834 Cauda equina syndrome: Secondary | ICD-10-CM | POA: Diagnosis present

## 2023-01-28 DIAGNOSIS — F419 Anxiety disorder, unspecified: Secondary | ICD-10-CM | POA: Diagnosis present

## 2023-01-28 DIAGNOSIS — Z743 Need for continuous supervision: Secondary | ICD-10-CM | POA: Diagnosis not present

## 2023-01-28 DIAGNOSIS — M48062 Spinal stenosis, lumbar region with neurogenic claudication: Secondary | ICD-10-CM | POA: Diagnosis present

## 2023-01-28 DIAGNOSIS — M96840 Postprocedural hematoma of a musculoskeletal structure following a musculoskeletal system procedure: Secondary | ICD-10-CM | POA: Diagnosis not present

## 2023-01-28 DIAGNOSIS — Z888 Allergy status to other drugs, medicaments and biological substances status: Secondary | ICD-10-CM | POA: Diagnosis not present

## 2023-01-28 DIAGNOSIS — G992 Myelopathy in diseases classified elsewhere: Secondary | ICD-10-CM | POA: Diagnosis present

## 2023-01-28 DIAGNOSIS — M5136 Other intervertebral disc degeneration, lumbar region with discogenic back pain only: Secondary | ICD-10-CM | POA: Diagnosis not present

## 2023-01-28 DIAGNOSIS — D649 Anemia, unspecified: Secondary | ICD-10-CM | POA: Diagnosis not present

## 2023-01-28 DIAGNOSIS — M4807 Spinal stenosis, lumbosacral region: Secondary | ICD-10-CM | POA: Diagnosis not present

## 2023-01-28 DIAGNOSIS — Z91048 Other nonmedicinal substance allergy status: Secondary | ICD-10-CM | POA: Diagnosis not present

## 2023-01-28 DIAGNOSIS — M549 Dorsalgia, unspecified: Secondary | ICD-10-CM | POA: Diagnosis not present

## 2023-01-28 SURGERY — POSTERIOR LUMBAR FUSION 1 LEVEL
Anesthesia: General | Site: Spine Lumbar

## 2023-01-28 MED ORDER — PHENYLEPHRINE HCL-NACL 20-0.9 MG/250ML-% IV SOLN
INTRAVENOUS | Status: DC | PRN
  Administered 2023-01-28: 50 ug/min via INTRAVENOUS

## 2023-01-28 MED ORDER — ACETAMINOPHEN 650 MG RE SUPP: 650.0000 mg | RECTAL | Status: DC | PRN

## 2023-01-28 MED ORDER — CHLORHEXIDINE GLUCONATE CLOTH 2 % EX PADS: 6.0000 | MEDICATED_PAD | Freq: Once | CUTANEOUS | Status: DC

## 2023-01-28 MED ORDER — CHLORHEXIDINE GLUCONATE 0.12 % MT SOLN
15.0000 mL | Freq: Once | OROMUCOSAL | Status: AC
  Administered 2023-01-28: 15 mL via OROMUCOSAL
  Filled 2023-01-28: qty 15

## 2023-01-28 MED ORDER — METHOCARBAMOL 1000 MG/10ML IJ SOLN: 500.0000 mg | Freq: Four times a day (QID) | INTRAVENOUS | Status: DC | PRN

## 2023-01-28 MED ORDER — FENTANYL CITRATE (PF) 100 MCG/2ML IJ SOLN
25.0000 ug | INTRAMUSCULAR | Status: DC | PRN
  Administered 2023-01-28: 25 ug via INTRAVENOUS

## 2023-01-28 MED ORDER — POLYVINYL ALCOHOL 1.4 % OP SOLN: 1.0000 [drp] | OPHTHALMIC | Status: DC | PRN

## 2023-01-28 MED ORDER — PROPOFOL 10 MG/ML IV BOLUS
INTRAVENOUS | Status: DC | PRN
  Administered 2023-01-28: 120 mg via INTRAVENOUS

## 2023-01-28 MED ORDER — POTASSIUM CHLORIDE IN NACL 20-0.45 MEQ/L-% IV SOLN
INTRAVENOUS | Status: DC
  Filled 2023-01-28: qty 1000

## 2023-01-28 MED ORDER — OXYCODONE HCL 5 MG PO TABS
10.0000 mg | ORAL_TABLET | ORAL | Status: DC | PRN
  Filled 2023-01-28: qty 2

## 2023-01-28 MED ORDER — ACETAMINOPHEN 325 MG PO TABS: 650.0000 mg | ORAL_TABLET | ORAL | Status: DC | PRN

## 2023-01-28 MED ORDER — ROCURONIUM BROMIDE 10 MG/ML (PF) SYRINGE
PREFILLED_SYRINGE | INTRAVENOUS | Status: DC | PRN
  Administered 2023-01-28 (×2): 20 mg via INTRAVENOUS
  Administered 2023-01-28: 60 mg via INTRAVENOUS

## 2023-01-28 MED ORDER — FENTANYL CITRATE (PF) 250 MCG/5ML IJ SOLN
INTRAMUSCULAR | Status: DC | PRN
  Administered 2023-01-28 (×2): 50 ug via INTRAVENOUS
  Administered 2023-01-28: 100 ug via INTRAVENOUS
  Administered 2023-01-28: 50 ug via INTRAVENOUS

## 2023-01-28 MED ORDER — MENTHOL 3 MG MT LOZG: 1.0000 | LOZENGE | OROMUCOSAL | Status: DC | PRN

## 2023-01-28 MED ORDER — DOCUSATE SODIUM 100 MG PO CAPS
100.0000 mg | ORAL_CAPSULE | Freq: Two times a day (BID) | ORAL | Status: DC
  Administered 2023-01-28 – 2023-01-29 (×2): 100 mg via ORAL
  Filled 2023-01-28 (×2): qty 1

## 2023-01-28 MED ORDER — TIZANIDINE HCL 4 MG PO TABS
2.0000 mg | ORAL_TABLET | Freq: Every evening | ORAL | Status: DC | PRN
  Administered 2023-01-28: 2 mg via ORAL
  Filled 2023-01-28: qty 1

## 2023-01-28 MED ORDER — SODIUM CHLORIDE 0.9% FLUSH: 3.0000 mL | INTRAVENOUS | Status: DC | PRN

## 2023-01-28 MED ORDER — AMLODIPINE BESYLATE 5 MG PO TABS
5.0000 mg | ORAL_TABLET | Freq: Every day | ORAL | Status: DC
  Administered 2023-01-29: 5 mg via ORAL
  Filled 2023-01-28: qty 1

## 2023-01-28 MED ORDER — CEFAZOLIN SODIUM-DEXTROSE 2-4 GM/100ML-% IV SOLN
2.0000 g | INTRAVENOUS | Status: AC
  Administered 2023-01-28: 2 g via INTRAVENOUS
  Filled 2023-01-28: qty 100

## 2023-01-28 MED ORDER — LIDOCAINE 2% (20 MG/ML) 5 ML SYRINGE
INTRAMUSCULAR | Status: DC | PRN
  Administered 2023-01-28: 80 mg via INTRAVENOUS

## 2023-01-28 MED ORDER — METHOCARBAMOL 500 MG PO TABS
500.0000 mg | ORAL_TABLET | Freq: Four times a day (QID) | ORAL | Status: DC | PRN
  Filled 2023-01-28: qty 1

## 2023-01-28 MED ORDER — FENTANYL CITRATE (PF) 250 MCG/5ML IJ SOLN
INTRAMUSCULAR | Status: AC
  Filled 2023-01-28: qty 5

## 2023-01-28 MED ORDER — VASOPRESSIN 20 UNIT/ML IV SOLN
INTRAVENOUS | Status: AC
  Filled 2023-01-28: qty 1

## 2023-01-28 MED ORDER — LACTATED RINGERS IV SOLN: INTRAVENOUS | Status: DC

## 2023-01-28 MED ORDER — SUGAMMADEX SODIUM 200 MG/2ML IV SOLN
INTRAVENOUS | Status: DC | PRN
  Administered 2023-01-28: 200 mg via INTRAVENOUS

## 2023-01-28 MED ORDER — DEXAMETHASONE SODIUM PHOSPHATE 10 MG/ML IJ SOLN
INTRAMUSCULAR | Status: DC | PRN
  Administered 2023-01-28: 10 mg via INTRAVENOUS

## 2023-01-28 MED ORDER — LIDOCAINE-EPINEPHRINE 1 %-1:100000 IJ SOLN
INTRAMUSCULAR | Status: DC | PRN
  Administered 2023-01-28: 5 mL

## 2023-01-28 MED ORDER — FENTANYL CITRATE (PF) 100 MCG/2ML IJ SOLN
INTRAMUSCULAR | Status: AC
  Filled 2023-01-28: qty 2

## 2023-01-28 MED ORDER — BISACODYL 10 MG RE SUPP: 10.0000 mg | Freq: Every day | RECTAL | Status: DC | PRN

## 2023-01-28 MED ORDER — SODIUM CHLORIDE 0.9 % IV SOLN
250.0000 mL | INTRAVENOUS | Status: DC
  Administered 2023-01-28: 250 mL via INTRAVENOUS

## 2023-01-28 MED ORDER — CEFAZOLIN SODIUM-DEXTROSE 2-4 GM/100ML-% IV SOLN
2.0000 g | Freq: Three times a day (TID) | INTRAVENOUS | Status: AC
  Administered 2023-01-28 – 2023-01-29 (×2): 2 g via INTRAVENOUS
  Filled 2023-01-28 (×2): qty 100

## 2023-01-28 MED ORDER — ONDANSETRON HCL 4 MG/2ML IJ SOLN: 4.0000 mg | Freq: Four times a day (QID) | INTRAMUSCULAR | Status: DC | PRN

## 2023-01-28 MED ORDER — THROMBIN 5000 UNITS EX SOLR
CUTANEOUS | Status: AC
  Filled 2023-01-28: qty 5000

## 2023-01-28 MED ORDER — ONDANSETRON HCL 4 MG/2ML IJ SOLN
INTRAMUSCULAR | Status: DC | PRN
  Administered 2023-01-28 (×2): 4 mg via INTRAVENOUS

## 2023-01-28 MED ORDER — PHENYLEPHRINE 80 MCG/ML (10ML) SYRINGE FOR IV PUSH (FOR BLOOD PRESSURE SUPPORT)
PREFILLED_SYRINGE | INTRAVENOUS | Status: DC | PRN
  Administered 2023-01-28 (×2): 80 ug via INTRAVENOUS
  Administered 2023-01-28: 160 ug via INTRAVENOUS
  Administered 2023-01-28: 80 ug via INTRAVENOUS
  Administered 2023-01-28: 160 ug via INTRAVENOUS

## 2023-01-28 MED ORDER — LIDOCAINE-EPINEPHRINE 1 %-1:100000 IJ SOLN
INTRAMUSCULAR | Status: AC
  Filled 2023-01-28: qty 1

## 2023-01-28 MED ORDER — BUPIVACAINE HCL (PF) 0.5 % IJ SOLN
INTRAMUSCULAR | Status: AC
  Filled 2023-01-28: qty 30

## 2023-01-28 MED ORDER — SENNA 8.6 MG PO TABS
1.0000 | ORAL_TABLET | Freq: Two times a day (BID) | ORAL | Status: DC
  Administered 2023-01-28 – 2023-01-29 (×2): 8.6 mg via ORAL
  Filled 2023-01-28 (×2): qty 1

## 2023-01-28 MED ORDER — ONDANSETRON HCL 4 MG PO TABS: 4.0000 mg | ORAL_TABLET | Freq: Four times a day (QID) | ORAL | Status: DC | PRN

## 2023-01-28 MED ORDER — GLYCERIN-HYPROMELLOSE-PEG 400 0.2-0.2-1 % OP SOLN: 1.0000 [drp] | Freq: Every day | OPHTHALMIC | Status: DC | PRN

## 2023-01-28 MED ORDER — PANTOPRAZOLE SODIUM 40 MG IV SOLR: 40.0000 mg | Freq: Every day | INTRAVENOUS | Status: DC

## 2023-01-28 MED ORDER — BUPIVACAINE HCL (PF) 0.5 % IJ SOLN
INTRAMUSCULAR | Status: DC | PRN
  Administered 2023-01-28: 5 mL

## 2023-01-28 MED ORDER — PHENOL 1.4 % MT LIQD: 1.0000 | OROMUCOSAL | Status: DC | PRN

## 2023-01-28 MED ORDER — MORPHINE SULFATE (PF) 2 MG/ML IV SOLN: 2.0000 mg | INTRAVENOUS | Status: DC | PRN

## 2023-01-28 MED ORDER — EZETIMIBE 10 MG PO TABS
10.0000 mg | ORAL_TABLET | Freq: Every day | ORAL | Status: DC
  Administered 2023-01-29: 10 mg via ORAL
  Filled 2023-01-28: qty 1

## 2023-01-28 MED ORDER — SODIUM CHLORIDE 0.9% FLUSH: 3.0000 mL | Freq: Two times a day (BID) | INTRAVENOUS | Status: DC

## 2023-01-28 MED ORDER — ACETAMINOPHEN 500 MG PO TABS
1000.0000 mg | ORAL_TABLET | Freq: Once | ORAL | Status: AC
  Administered 2023-01-28: 500 mg via ORAL
  Filled 2023-01-28: qty 2

## 2023-01-28 MED ORDER — PANTOPRAZOLE SODIUM 40 MG PO TBEC
40.0000 mg | DELAYED_RELEASE_TABLET | Freq: Every day | ORAL | Status: DC
  Administered 2023-01-28: 40 mg via ORAL
  Filled 2023-01-28: qty 1

## 2023-01-28 MED ORDER — 0.9 % SODIUM CHLORIDE (POUR BTL) OPTIME
TOPICAL | Status: DC | PRN
  Administered 2023-01-28: 1000 mL

## 2023-01-28 MED ORDER — ORAL CARE MOUTH RINSE: 15.0000 mL | Freq: Once | OROMUCOSAL | Status: AC

## 2023-01-28 MED ORDER — OXYCODONE HCL 5 MG PO TABS
5.0000 mg | ORAL_TABLET | ORAL | Status: DC | PRN
  Administered 2023-01-28 – 2023-01-29 (×4): 5 mg via ORAL
  Filled 2023-01-28 (×3): qty 1

## 2023-01-28 MED ORDER — ONDANSETRON HCL 4 MG/2ML IJ SOLN: 4.0000 mg | Freq: Once | INTRAMUSCULAR | Status: DC | PRN

## 2023-01-28 MED ORDER — THROMBIN 5000 UNITS EX SOLR: OROMUCOSAL | Status: DC | PRN

## 2023-01-28 SURGICAL SUPPLY — 67 items
ADH SKN CLS APL DERMABOND .7 (GAUZE/BANDAGES/DRESSINGS) ×1
APL SKNCLS STERI-STRIP NONHPOA (GAUZE/BANDAGES/DRESSINGS)
BAG COUNTER SPONGE SURGICOUNT (BAG) ×1 IMPLANT
BAG SPNG CNTER NS LX DISP (BAG) ×1
BASKET BONE COLLECTION (BASKET) ×1 IMPLANT
BENZOIN TINCTURE PRP APPL 2/3 (GAUZE/BANDAGES/DRESSINGS) IMPLANT
BLADE CLIPPER SURG (BLADE) IMPLANT
BLADE SURG 11 STRL SS (BLADE) ×1 IMPLANT
BUR MATCHSTICK NEURO 3.0 LAGG (BURR) ×1 IMPLANT
BUR PRECISION FLUTE 5.0 (BURR) ×1 IMPLANT
CAGE INTERBODY PL SHT 7X22.5 (Plate) IMPLANT
CANISTER SUCT 3000ML PPV (MISCELLANEOUS) ×1 IMPLANT
CNTNR URN SCR LID CUP LEK RST (MISCELLANEOUS) ×1 IMPLANT
CONT SPEC 4OZ STRL OR WHT (MISCELLANEOUS) ×1
COVER BACK TABLE 60X90IN (DRAPES) ×1 IMPLANT
DERMABOND ADVANCED .7 DNX12 (GAUZE/BANDAGES/DRESSINGS) ×1 IMPLANT
DRAPE C-ARM 42X72 X-RAY (DRAPES) ×1 IMPLANT
DRAPE C-ARMOR (DRAPES) ×1 IMPLANT
DRAPE LAPAROTOMY 100X72X124 (DRAPES) ×1 IMPLANT
DRAPE SURG 17X23 STRL (DRAPES) ×1 IMPLANT
DRSG OPSITE POSTOP 4X6 (GAUZE/BANDAGES/DRESSINGS) IMPLANT
DURAPREP 26ML APPLICATOR (WOUND CARE) ×1 IMPLANT
ELECT REM PT RETURN 9FT ADLT (ELECTROSURGICAL) ×1
ELECTRODE REM PT RTRN 9FT ADLT (ELECTROSURGICAL) ×1 IMPLANT
GAUZE 4X4 16PLY ~~LOC~~+RFID DBL (SPONGE) IMPLANT
GAUZE SPONGE 4X4 12PLY STRL (GAUZE/BANDAGES/DRESSINGS) IMPLANT
GLOVE BIOGEL PI IND STRL 7.5 (GLOVE) ×2 IMPLANT
GLOVE ECLIPSE 7.0 STRL STRAW (GLOVE) ×2 IMPLANT
GLOVE EXAM NITRILE XL STR (GLOVE) IMPLANT
GOWN STRL REUS W/ TWL LRG LVL3 (GOWN DISPOSABLE) ×4 IMPLANT
GOWN STRL REUS W/ TWL XL LVL3 (GOWN DISPOSABLE) IMPLANT
GOWN STRL REUS W/TWL 2XL LVL3 (GOWN DISPOSABLE) IMPLANT
GOWN STRL REUS W/TWL LRG LVL3 (GOWN DISPOSABLE) ×4
GOWN STRL REUS W/TWL XL LVL3 (GOWN DISPOSABLE)
GRAFT BONE PROTEIOS XS 0.5CC (Orthopedic Implant) IMPLANT
HEMOSTAT POWDER KIT SURGIFOAM (HEMOSTASIS) ×1 IMPLANT
KIT BASIN OR (CUSTOM PROCEDURE TRAY) ×1 IMPLANT
KIT POSITION SURG JACKSON T1 (MISCELLANEOUS) ×1 IMPLANT
KIT TURNOVER KIT B (KITS) ×1 IMPLANT
MILL BONE PREP (MISCELLANEOUS) ×1 IMPLANT
NDL HYPO 18GX1.5 BLUNT FILL (NEEDLE) IMPLANT
NDL HYPO 22X1.5 SAFETY MO (MISCELLANEOUS) ×1 IMPLANT
NDL SPNL 18GX3.5 QUINCKE PK (NEEDLE) IMPLANT
NEEDLE HYPO 18GX1.5 BLUNT FILL (NEEDLE)
NEEDLE HYPO 22X1.5 SAFETY MO (MISCELLANEOUS) ×1
NEEDLE SPNL 18GX3.5 QUINCKE PK (NEEDLE)
NS IRRIG 1000ML POUR BTL (IV SOLUTION) ×1 IMPLANT
PACK LAMINECTOMY NEURO (CUSTOM PROCEDURE TRAY) ×1 IMPLANT
PAD ARMBOARD 7.5X6 YLW CONV (MISCELLANEOUS) ×3 IMPLANT
PUTTY GRAFTON DBF 6CC W/DELIVE (Putty) IMPLANT
ROD CC 30MM (Rod) IMPLANT
SCREW 5.5X35MM (Screw) ×4 IMPLANT
SCREW BN 35X5.5XMA NS SPNE (Screw) IMPLANT
SCREW SET SOLERA (Screw) ×4 IMPLANT
SCREW SET SOLERA TI (Screw) IMPLANT
SPIKE FLUID TRANSFER (MISCELLANEOUS) ×1 IMPLANT
SPONGE SURGIFOAM ABS GEL 100 (HEMOSTASIS) IMPLANT
SPONGE T-LAP 4X18 ~~LOC~~+RFID (SPONGE) IMPLANT
STRIP CLOSURE SKIN 1/2X4 (GAUZE/BANDAGES/DRESSINGS) IMPLANT
SUT VIC AB 0 CT1 18XCR BRD8 (SUTURE) ×1 IMPLANT
SUT VIC AB 0 CT1 8-18 (SUTURE) ×2
SUT VICRYL 3-0 RB1 18 ABS (SUTURE) ×1 IMPLANT
SYR 3ML LL SCALE MARK (SYRINGE) ×3 IMPLANT
TOWEL GREEN STERILE (TOWEL DISPOSABLE) ×1 IMPLANT
TOWEL GREEN STERILE FF (TOWEL DISPOSABLE) ×1 IMPLANT
TRAY FOLEY MTR SLVR 16FR STAT (SET/KITS/TRAYS/PACK) ×1 IMPLANT
WATER STERILE IRR 1000ML POUR (IV SOLUTION) ×1 IMPLANT

## 2023-01-28 NOTE — Op Note (Signed)
NEUROSURGERY OPERATIVE NOTE   PREOP DIAGNOSIS:  1. Spondylolisthesis, L4-5 2. Lumbar spinal stenosis with neurogenic claudication  POSTOP DIAGNOSIS: Same  PROCEDURE: 1. L4 laminectomy with facetectomy for decompression of exiting nerve roots, more than would be required for placement of interbody graft 2. Placement of anterior interbody device - Medtronic 7mm expandable cages x2 3. Posterior non-segmental instrumentation using cortical pedicle screws at L4 - L5 - Medtronic 6.5 x 35mm screws, lordotic 30mm rod 4. Interbody arthrodesis, L4-5 5. Use of locally harvested bone autograft 6. Use of non-structural bone allograft - ProteiOs, DBM  SURGEON: Dr. Lisbeth Renshaw, MD  ASSISTANT: Dr. Monia Pouch, MD  ANESTHESIA: General Endotracheal  EBL: 100cc  SPECIMENS: None  DRAINS: None  COMPLICATIONS: None immediate  CONDITION: Hemodynamically stable to PACU  HISTORY: Cheryl Pearson is a 82 y.o. female who has been followed in the outpatient clinic with back and leg pain related to spondylolisthesis at L4-5.  We have been managing her symptoms conservatively over the last few years.  Unfortunately she has had progression of both back and leg pain.  Repeat imaging has revealed worsening spondylolisthesis with worsening multifactorial stenosis.  She has therefore elected to proceed with surgical decompression and fusion. Risks, benefits, and alternative treatments were reviewed in detail in the office. After all questions were answered, informed consent was obtained and witnessed.  PROCEDURE IN DETAIL: The patient was brought to the operating room via stretcher. After induction of general anesthesia, the patient was positioned on the operative table in the prone position. All pressure points were meticulously padded. Incision was then marked out and prepped and draped in the usual sterile fashion.  After timeout was conducted, skin was infiltrated with local anesthetic.  Spinal needle  was then introduced to identify the surface projection of the L4-5 interspace.  Skin incision was then made sharply and Bovie electrocautery was used to dissect the subcutaneous tissue until the lumbodorsal fascia was identified and incised. The muscle was then elevated in the subperiosteal plane and the L4 lamina and L4-5 facet complexes were identified. Self-retaining retractors were then placed. Lateral fluoroscopy was taken with a dissector in the L4-5 interspace to confirm our location.  At this point attention was turned to decompression. Complete L4 laminectomy was completed with a high-speed drill and Kerrison punches.  Normal dura was identified.  A ball-tipped dissector was then used to identify the foramina bilaterally.  High-speed drill was used to cut across the pars interarticularis and the inferior articulating process of L4 was removed bilaterally.  Dura was then carefully dissected away from the remainder of the ligamentum flavum.  Kerrison punches were then used to remove the medial and superior aspect of the superior articulating process of L5 in order to fully decompress both the traversing L5 and exiting L4 nerve roots.  The medial facet complex was noted to be densely adherent to the dura on the left side.  I was able to dissect this away from the dura sharply.  Once this was completed I was able to easily pass a ball-tipped dissector within the ventral epidural space and around the L4 and L5 nerve roots indicating good decompression.  Disc space was then identified, incised bilaterally, and using a combination of shavers, curettes and rongeurs, complete discectomy was completed. Endplates were prepared, and bone harvested during decompression was mixed with proteios and DBM and packed into the interspace. A 7mm expandable cage was tapped into place bilaterally. cages were expanded to achieve good endplate apposition.  Good  position was confirmed with fluoroscopy.  At this point, the  entry points for bilateral L4 and L5 cortical pedicle screws were identified using standard anatomic landmarks and lateral fluoro. Pilot holes were then drilled and tapped to 5.5 x 35mm. Screws were then placed in L4 and L5 measuring 6.5 x 35mm. Prebent lordotic rod was then sized and placed into the pedicle screws. Set screws were placed and final tightened. Final AP and lateral fluoroscopic images confirmed good position.  Hemostasis was secured and confirmed with bipolar cautery and morcellized gelfoam with thrombin. The wound was then irrigated with copious amounts of antibiotic saline, then closed in standard fashion using a combination of interrupted 0 and 3-0 Vicryl stitches in the muscular, fascial, and subcutaneous layers. Skin was then closed using standard Dermabond. Sterile dressing was then applied. The patient was then transferred to the stretcher, extubated, and taken to the postanesthesia care unit in stable hemodynamic condition.  At the end of the case all sponge, needle, cottonoid, and instrument counts were correct.   Lisbeth Renshaw, MD [facility]

## 2023-01-28 NOTE — H&P (Signed)
Chief Complaint   Back and leg pain  History of Present Illness  Cheryl Pearson is a 82 y.o. female Cheryl Pearson is a 82 year old woman I am seeing in follow-up, although she was last seen nearly three years ago. I have seen her in the past primarily for low back pain with some radiation to the lower extremities. She did actually also get diagnosed with upper thoracic myelopathy for which she underwent T1-2 decompression and stabilization. Her last visit three years ago she was complaining of low back pain. At that time she attempted some conservative treatment including a course of physical therapy. She actually did quite well with this and was doing well for a number of years until this past June when she had recurrence of back pain. There was potentially some association with time spent in her garden picking weeks. Nonetheless, she has since been complaining of relatively severe pain across the lower back and bilateral buttocks. Initially she had pain radiating down the right leg, and is now complaining of similar radiating pain to the left leg. In addition, she has noted significant weakness in both legs. She feels like her legs will give out if she stands for more than a few minutes. Upon questioning, she relates some vague symptoms of incontinence, primarily when she stands up and has some dribbling. Because of the pain she has been unable to participate in physical therapy which she started at the discretion of her primary doctor a month ago. She does occasionally take over-the-counter Motrin which seems to provide significant improvement in symptoms. She did also get a steroid injection and a short course of oral steroids from her primary doctor which also provided significant improvement.  Her MRI has revealed continued spondylosis with associated stenosis at L4 five. She therefore presents today for surgical decompression and stabilization.  Past Medical History  Past Medical History:  Diagnosis  Date  . Anxiety   . Arthritis   . Colitis   . Early age-related macular degeneration   . Hernia   . Hypertension   . Infertility, female   . Menopausal symptoms   . Rosacea   . Seasonal allergies    pt states she "sneezes and coughs alot"  . Thyroid disease 1968   hypothyroidism treated with meds for short time then all labs normal    Past Surgical History  Past Surgical History:  Procedure Laterality Date  . bone spur Left    on left foot  . COLONOSCOPY  11/2011   polyp, ulcerative coliits inactive  . COLONOSCOPY  2005  . COLONOSCOPY  1970's   diagnosed with ulcerative colitis  . ESOPHAGOGASTRODUODENOSCOPY (EGD) WITH PROPOFOL N/A 11/20/2014   Procedure: ESOPHAGOGASTRODUODENOSCOPY (EGD) WITH PROPOFOL;  Surgeon: Carman Ching, MD;  Location: [facility] ENDOSCOPY;  Service: Endoscopy;  Laterality: N/A;  . INGUINAL HERNIA REPAIR Right 49 mos old  . LAPAROSCOPIC CHOLECYSTECTOMY  09/1998  . SAVORY DILATION N/A 11/20/2014   Procedure: SAVORY DILATION;  Surgeon: Carman Ching, MD;  Location: [facility] ENDOSCOPY;  Service: Endoscopy;  Laterality: N/A;  . THORACIC FUSION  03/2018  . WISDOM TOOTH EXTRACTION  1968    Social History  Social History   Tobacco Use  . Smoking status: Never  . Smokeless tobacco: Never  Vaping Use  . Vaping status: Never Used  Substance Use Topics  . Alcohol use: Yes    Alcohol/week: 1.0 standard drink of alcohol    Types: 1 Standard drinks or equivalent per week    Comment:  maybe once every 2 months  . Drug use: No    Medications   Prior to Admission medications   Medication Sig Start Date End Date Taking? Authorizing Provider  acetaminophen (TYLENOL) 325 MG tablet Take 2 tablets (650 mg total) by mouth every 4 (four) hours as needed for mild pain ((score 1 to 3) or temp > 100.5). 05/06/18  Yes Angiulli, Mcarthur Rossetti, PA-C  amLODipine (NORVASC) 2.5 MG tablet Take 1 tablet (2.5 mg total) by mouth daily. Patient taking differently: Take 5 mg by mouth daily.  05/06/18  Yes Angiulli, Mcarthur Rossetti, PA-C  ezetimibe (ZETIA) 10 MG tablet Take 1 tablet (10 mg total) by mouth daily. 05/06/18  Yes Angiulli, Mcarthur Rossetti, PA-C  Glycerin-Hypromellose-PEG 400 (DRY EYE RELIEF DROPS OP) Place 1 drop into both eyes daily as needed (Dry eye).   Yes [provider]  ibuprofen (ADVIL) 200 MG tablet Take 400 mg by mouth every 6 (six) hours as needed for mild pain or moderate pain.   Yes [provider]  Multiple Vitamins-Minerals (PRESERVISION AREDS 2 PO) Take 2 capsules by mouth daily. Chewable   Yes [provider]  tiZANidine (ZANAFLEX) 2 MG tablet Take 2 mg by mouth at bedtime as needed for muscle spasms.   Yes [provider]    Allergies  Allergies  Allergen Reactions  . Yellow Jacket Venom [Bee Venom] Anaphylaxis  . Crestor [Rosuvastatin] Other (See Comments)    aches  . Lipitor [Atorvastatin] Other (See Comments)    Joint pain.  . Statins Other (See Comments)    aches  . Welchol [Colesevelam Hcl] Other (See Comments)    aches  . Zocor [Simvastatin] Other (See Comments)    aches    Review of Systems  ROS  Neurologic Exam  awake, alert, oriented Speech fluent CN intact good strength throughout, generalized weakness. mild bilateral hip flexor/knee extensor weakness.  Imaging  MRI reveals significant disc degenerative disease with grade one degenerative anterolisthesis at L4, five and broad-based. There's bilateral facet arthropathy and ligament hypertrophy with associated severe central and lateral recess stenosis.  Impression  - 82 y.o. female with progressive back and leg pain and subjective leg weakness related to spondylolisthesis L 45 with multifactorial stenosis.  Plan  - Will proceed with PLIF L4-5  I have reviewed the tree options at length with the patient in the office. We have discussed the details of the operation as well as the expected postoperative course and cover. We have also reviewed the associated  risks, benefits, and alternatives to surgery. All her questions today were answered and she provided informed consent to proceed.

## 2023-01-28 NOTE — Anesthesia Procedure Notes (Signed)
Procedure Name: Intubation Date/Time: 01/28/2023 1:10 PM  Performed by: Hessie Diener, CRNAPre-anesthesia Checklist: Patient identified, Emergency Drugs available, Suction available and Patient being monitored Patient Re-evaluated:Patient Re-evaluated prior to induction Oxygen Delivery Method: Circle System Utilized Preoxygenation: Pre-oxygenation with 100% oxygen Induction Type: IV induction Ventilation: Mask ventilation without difficulty Tube type: Oral Number of attempts: 1 Airway Equipment and Method: Stylet and Oral airway Placement Confirmation: ETT inserted through vocal cords under direct vision, positive ETCO2 and breath sounds checked- equal and bilateral Tube secured with: Tape Dental Injury: Teeth and Oropharynx as per pre-operative assessment

## 2023-01-28 NOTE — Progress Notes (Signed)
Orthopedic Tech Progress Note Patient Details:  Cheryl Pearson April 17, 1941 [id number]  Ortho Devices Type of Ortho Device: Lumbar corsett Ortho Device/Splint Location: BACK Ortho Device/Splint Interventions: Ordered   Post Interventions Patient Tolerated: Well Instructions Provided: Care of device  Donald Pore 01/28/2023, 7:33 PM

## 2023-01-28 NOTE — Anesthesia Postprocedure Evaluation (Signed)
Anesthesia Post Note  Patient: Cheryl Pearson  Procedure(s) Performed: Posterior Lumbar Interbody Fusion Lumbar Four-Lumbar Five (Spine Lumbar)     Patient location during evaluation: PACU Anesthesia Type: General Level of consciousness: awake and alert Pain management: pain level controlled Vital Signs Assessment: post-procedure vital signs reviewed and stable Respiratory status: spontaneous breathing, nonlabored ventilation, respiratory function stable and patient connected to nasal cannula oxygen Cardiovascular status: blood pressure returned to baseline and stable Postop Assessment: no apparent nausea or vomiting Anesthetic complications: no   No notable events documented.  Last Vitals:  Vitals:   01/28/23 1700 01/28/23 1736  BP: (!) 157/55 (!) 172/80  Pulse: 91 90  Resp: 20 18  Temp: 36.8 C 36.5 C  SpO2: 96% 98%    Last Pain:  Vitals:   01/28/23 1736  TempSrc: Oral  PainSc:                  [name]

## 2023-01-28 NOTE — Transfer of Care (Signed)
Immediate Anesthesia Transfer of Care Note  Patient: Cheryl Pearson  Procedure(s) Performed: Posterior Lumbar Interbody Fusion Lumbar Four-Lumbar Five (Spine Lumbar)  Patient Location: PACU  Anesthesia Type:General  Level of Consciousness: awake and oriented  Airway & Oxygen Therapy: Patient Spontanous Breathing and Patient connected to nasal cannula oxygen  Post-op Assessment: Report given to RN and Post -op Vital signs reviewed and stable  Post vital signs: Reviewed and stable  Last Vitals:  Vitals Value Taken Time  BP    Temp    Pulse 92 01/28/23 1622  Resp 25 01/28/23 1622  SpO2 95 % 01/28/23 1622  Vitals shown include unfiled device data.  Last Pain:  Vitals:   01/28/23 1054  TempSrc:   PainSc: 0-No pain         Complications: No notable events documented.

## 2023-01-28 NOTE — Anesthesia Preprocedure Evaluation (Addendum)
Anesthesia Evaluation  Patient identified by MRN, date of birth, ID band Patient awake    Reviewed: Allergy & Precautions, NPO status , Patient's Chart, lab work & pertinent test results  History of Anesthesia Complications Negative for: history of anesthetic complications  Airway Mallampati: II  TM Distance: >3 FB Neck ROM: Full    Dental  (+) Teeth Intact, Dental Advisory Given, Caps,    Pulmonary neg pulmonary ROS   Pulmonary exam normal breath sounds clear to auscultation       Cardiovascular hypertension, Pt. on medications Normal cardiovascular exam Rhythm:Regular Rate:Normal     Neuro/Psych  PSYCHIATRIC DISORDERS Anxiety     SPONDYLOLISTHESIS, LUMBAR REGION    GI/Hepatic negative GI ROS, Neg liver ROS,,,  Endo/Other  negative endocrine ROS    Renal/GU negative Renal ROS     Musculoskeletal  (+) Arthritis ,    Abdominal   Peds  Hematology negative hematology ROS (+)   Anesthesia Other Findings Day of surgery medications reviewed with the patient.  Reproductive/Obstetrics                             Anesthesia Physical Anesthesia Plan  ASA: 3  Anesthesia Plan: General   Post-op Pain Management: Tylenol PO (pre-op)*   Induction: Intravenous  PONV Risk Score and Plan: 3 and Dexamethasone and Ondansetron  Airway Management Planned: Oral ETT  Additional Equipment:   Intra-op Plan:   Post-operative Plan: Extubation in OR  Informed Consent: I have reviewed the patients History and Physical, chart, labs and discussed the procedure including the risks, benefits and alternatives for the proposed anesthesia with the patient or authorized representative who has indicated his/her understanding and acceptance.     Dental advisory given  Plan Discussed with: CRNA  Anesthesia Plan Comments: (2nd PIV after induction)       Anesthesia Quick Evaluation

## 2023-01-29 MED ORDER — OXYCODONE HCL 5 MG PO TABS: 5.0000 mg | ORAL_TABLET | ORAL | 0 refills | Status: AC | PRN

## 2023-01-29 MED ORDER — METHOCARBAMOL 500 MG PO TABS: 500.0000 mg | ORAL_TABLET | Freq: Four times a day (QID) | ORAL | 0 refills | Status: AC | PRN

## 2023-01-29 NOTE — Evaluation (Signed)
Occupational Therapy Evaluation and Discharge Patient Details Name: Cheryl Pearson MRN: [id number] DOB: 01-21-1941 Today's Date: 01/29/2023   History of Present Illness Pt is an 82 y/o female who presents s/p L4-L5 PLIF on 01/28/2023. PMH significant for Macular degeneration, HTN, hypothyroidism, bone spur on L foot, thoracic fusion on 2019.   Clinical Impression   This 82 yo female admitted and underwent above presents to acute OT with all education completed and post op handout given. No further OT needs, we will sign off.       If plan is discharge home, recommend the following: A little help with bathing/dressing/bathroom;A little help with walking and/or transfers;Assistance with cooking/housework;Help with stairs or ramp for entrance;Assist for transportation;Direct supervision/assist for financial management    Functional Status Assessment  Patient has had a recent decline in their functional status and demonstrates the ability to make significant improvements in function in a reasonable and predictable amount of time. (without further need for skilled OT services---all education completed and post op back handout provided)  Equipment Recommendations  None recommended by OT       Precautions / Restrictions Precautions Precautions: Fall;Back Precaution Booklet Issued: Yes (comment) Precaution Comments: Reviewed handout and pt was cued for precautions during ADLs Required Braces or Orthoses: Spinal Brace Spinal Brace: Lumbar corset;Applied in sitting position Restrictions Weight Bearing Restrictions: No      Mobility Bed Mobility Overal bed mobility: Needs Assistance             General bed mobility comments: Pt sitting on EOB eating breakfast upon my arrival    Transfers Overall transfer level: Needs assistance Equipment used: Rolling walker (2 wheels) Transfers: Sit to/from Stand Sit to Stand: Contact guard assist           General transfer comment: VC's for  hand placement on seated surface for safety.      Balance Overall balance assessment: Needs assistance Sitting-balance support: No upper extremity supported, Feet supported Sitting balance-Leahy Scale: Good     Standing balance support: No upper extremity supported, During functional activity Standing balance-Leahy Scale: Fair Standing balance comment: standing to pull up LB clothing                           ADL either performed or assessed with clinical judgement   ADL Overall ADL's : Needs assistance/impaired                                       General ADL Comments: Educated on use of 2 cups for oral care, use of wet wipes for back peri care, pillow placement when sleeping, building up sitting tolerance, when to wear brace and when she can have it off. Setup/S level overall and husband able to do this and will be with her all the time     Vision Patient Visual Report: No change from baseline              Pertinent Vitals/Pain Pain Assessment Pain Assessment: Faces Faces Pain Scale: Hurts a little bit Pain Location: incision site Pain Descriptors / Indicators: Operative site guarding, Sore Pain Intervention(s): Limited activity within patient's tolerance, Monitored during session, Repositioned     Extremity/Trunk Assessment Upper Extremity Assessment Upper Extremity Assessment: Overall WFL for tasks assessed           Communication Communication Communication: No apparent difficulties Cueing  Techniques: Verbal cues   Cognition Arousal: Alert Behavior During Therapy: WFL for tasks assessed/performed Overall Cognitive Status: History of cognitive impairments - at baseline                                 General Comments: Pt reports she does not remember well                Home Living Family/patient expects to be discharged to:: Private residence Living Arrangements: Spouse/significant other Available Help at  Discharge: Family;Available 24 hours/day Type of Home: House Home Access: Stairs to enter [employer] of Steps: 3 Entrance Stairs-Rails: Right;Left Home Layout: One level     Bathroom Shower/Tub: Producer, television/film/video: Standard                Prior Functioning/Environment Prior Level of Function : Needs assist             Mobility Comments: Pt reports husband assists as needed          OT Problem List: Decreased range of motion;Impaired balance (sitting and/or standing);Pain;Decreased cognition         OT Goals(Current goals can be found in the care plan section) Acute Rehab OT Goals Patient Stated Goal: to go  home today         AM-PAC OT "6 Clicks" Daily Activity     Outcome Measure Help from another person eating meals?: None Help from another person taking care of personal grooming?: A Little Help from another person toileting, which includes using toliet, bedpan, or urinal?: A Little Help from another person bathing (including washing, rinsing, drying)?: A Little Help from another person to put on and taking off regular upper body clothing?: A Little Help from another person to put on and taking off regular lower body clothing?: A Little 6 Click Score: 19   End of Session Equipment Utilized During Treatment: Rolling walker (2 wheels);Back brace Nurse Communication:  (no OT needs)  Activity Tolerance: Patient tolerated treatment well Patient left: in chair;with call bell/phone within reach;with family/visitor present  OT Visit Diagnosis: Unsteadiness on feet (R26.81);Other abnormalities of gait and mobility (R26.89);Pain Pain - part of body:  (incisional)                Time: 8469-6295 OT Time Calculation (min): 32 min Charges:  OT General Charges $OT Visit: 1 Visit OT Evaluation $OT Eval Moderate Complexity: 1 Mod OT Treatments $Self Care/Home Management : 8-22 mins  Lindon Romp OT Acute Rehabilitation Services Office  [phone]    Cheryl Pearson 01/29/2023, 3:35 PM

## 2023-01-29 NOTE — Progress Notes (Signed)
Patient alert and oriented, void, ambulate, surgical site clean and dry no sign of infection. D/c instructions explain and given to the patient and the husband all questions answered. Patient d/c home with RW per order.

## 2023-01-29 NOTE — Evaluation (Signed)
Physical Therapy Evaluation  Patient Details Name: Cheryl Pearson MRN: [id number] DOB: November 14, 1940 Today's Date: 01/29/2023  History of Present Illness  Pt is an 82 y/o female who presents s/p L4-L5 PLIF on 01/28/2023. PMH significant for Macular degeneration, HTN, hypothyroidism, bone spur on L foot, thoracic fusion on 2019.   Clinical Impression  Pt admitted with above diagnosis. At the time of PT eval, pt was able to demonstrate transfers and ambulation with gross CGA to min assist and RW for support. Pt was educated on precautions, brace application/wearing schedule, appropriate activity progression, and car transfer. Pt currently with functional limitations due to the deficits listed below (see PT Problem List). Pt will benefit from skilled PT to increase their independence and safety with mobility to allow discharge to the venue listed below.          If plan is discharge home, recommend the following: A little help with walking and/or transfers;A little help with bathing/dressing/bathroom;Assistance with cooking/housework;Assist for transportation;Help with stairs or ramp for entrance   Can travel by private vehicle        Equipment Recommendations Rolling walker (2 wheels)  Recommendations for Other Services       Functional Status Assessment Patient has had a recent decline in their functional status and demonstrates the ability to make significant improvements in function in a reasonable and predictable amount of time.     Precautions / Restrictions Precautions Precautions: Fall;Back Precaution Booklet Issued: Yes (comment) Precaution Comments: Reviewed handout and pt was cued for precautions during functional mobility. Required Braces or Orthoses: Spinal Brace Spinal Brace: Lumbar corset;Applied in sitting position Restrictions Weight Bearing Restrictions: No      Mobility  Bed Mobility Overal bed mobility: Needs Assistance Bed Mobility: Rolling, Sidelying to Sit, Sit  to Sidelying Rolling: Supervision Sidelying to sit: Supervision     Sit to sidelying: Min assist General bed mobility comments: Light assist to elevate LE's up into bed at end of session. HOB flat and rails lowered to simulate home environment.    Transfers Overall transfer level: Needs assistance Equipment used: Rolling walker (2 wheels) Transfers: Sit to/from Stand Sit to Stand: Contact guard assist           General transfer comment: VC's for hand placement on seated surface for safety.    Ambulation/Gait Ambulation/Gait assistance: Contact guard assist Gait Distance (Feet): 250 Feet Assistive device: Rolling walker (2 wheels) Gait Pattern/deviations: Step-through pattern, Decreased stride length, Trunk flexed, Narrow base of support Gait velocity: Decreased Gait velocity interpretation: 1.31 - 2.62 ft/sec, indicative of limited community ambulator   General Gait Details: VC's for improved posture, closer walker proximity, and forward gaze. No overt LOB noted.  Stairs Stairs: Yes Stairs assistance: Contact guard assist Stair Management: One rail Left, Step to pattern, Forwards Number of Stairs: 5 General stair comments: VC's for sequencing and general safety.  Wheelchair Mobility     Tilt Bed    Modified Rankin (Stroke Patients Only)       Balance Overall balance assessment: Needs assistance Sitting-balance support: No upper extremity supported, Feet supported Sitting balance-Leahy Scale: Fair     Standing balance support: Bilateral upper extremity supported, During functional activity, Reliant on assistive device for balance Standing balance-Leahy Scale: Poor                               Pertinent Vitals/Pain Pain Assessment Pain Assessment: Faces Faces Pain Scale: Hurts little more  Pain Location: incision site Pain Descriptors / Indicators: Operative site guarding, Sore Pain Intervention(s): Limited activity within patient's  tolerance, Monitored during session, Repositioned    Home Living Family/patient expects to be discharged to:: Private residence Living Arrangements: Spouse/significant other Available Help at Discharge: Family;Available 24 hours/day Type of Home: House Home Access: Stairs to enter Entrance Stairs-Rails: Doctor, general practice of Steps: 3   Home Layout: One level Home Equipment: Rollator (4 wheels);Standard Walker;BSC/3in1      Prior Function Prior Level of Function : Needs assist             Mobility Comments: Pt reports husband assists as needed       Extremity/Trunk Assessment   Upper Extremity Assessment Upper Extremity Assessment: Defer to OT evaluation    Lower Extremity Assessment Lower Extremity Assessment: Generalized weakness (Consistent with pre-op diagnosis)    Cervical / Trunk Assessment Cervical / Trunk Assessment: Back Surgery  Communication   Communication Communication: No apparent difficulties Cueing Techniques: Verbal cues;Gestural cues  Cognition Arousal: Alert Behavior During Therapy: WFL for tasks assessed/performed Overall Cognitive Status: Within Functional Limits for tasks assessed                                          General Comments      Exercises     Assessment/Plan    PT Assessment Patient needs continued PT services  PT Problem List Decreased strength;Decreased activity tolerance;Decreased balance;Decreased mobility;Decreased coordination;Decreased knowledge of use of DME;Decreased safety awareness;Decreased knowledge of precautions;Pain       PT Treatment Interventions DME instruction;Gait training;Stair training;Functional mobility training;Therapeutic activities;Therapeutic exercise;Balance training;Patient/family education    PT Goals (Current goals can be found in the Care Plan section)  Acute Rehab PT Goals Patient Stated Goal: Home today PT Goal Formulation: With patient/family Time  For Goal Achievement: 02/05/23 Potential to Achieve Goals: Good    Frequency Min 5X/week     Co-evaluation               AM-PAC PT "6 Clicks" Mobility  Outcome Measure Help needed turning from your back to your side while in a flat bed without using bedrails?: A Little Help needed moving from lying on your back to sitting on the side of a flat bed without using bedrails?: A Little Help needed moving to and from a bed to a chair (including a wheelchair)?: A Little Help needed standing up from a chair using your arms (e.g., wheelchair or bedside chair)?: A Little Help needed to walk in hospital room?: A Little Help needed climbing 3-5 steps with a railing? : A Little 6 Click Score: 18    End of Session Equipment Utilized During Treatment: Gait belt;Back brace Activity Tolerance: Patient tolerated treatment well Patient left: in bed;with call bell/phone within reach;with family/visitor present Nurse Communication: Mobility status PT Visit Diagnosis: Unsteadiness on feet (R26.81);Pain Pain - part of body:  (back)    Time: 0930-1002 PT Time Calculation (min) (ACUTE ONLY): 32 min   Charges:   PT Evaluation $PT Eval Low Complexity: 1 Low PT Treatments $Gait Training: 8-22 mins PT General Charges $$ ACUTE PT VISIT: 1 Visit         Conni Slipper, PT, DPT Acute Rehabilitation Services Secure Chat Preferred Office: [phone]   Marylynn Pearson 01/29/2023, 11:29 AM

## 2023-01-29 NOTE — Discharge Summary (Signed)
Physician Discharge Summary  Patient ID: Cheryl Pearson MRN: [id number] DOB/AGE: Feb 24, 1941 82 y.o.  Admit date: 01/28/2023 Discharge date: 01/29/2023  Admission Diagnoses:  L4-5 spondylolisthesis  Discharge Diagnoses:  Same Principal Problem:   Spondylolisthesis at L4-L5 level   Discharged Condition: Stable  Hospital Course:  Cheryl Pearson is a 82 y.o. female Admitted after elective L4-5 PLIF. She was at baseline postop, ambulating well, voiding normally and requested d/c home.  Treatments: Surgery - L4-5 PLIF  Discharge Exam: Blood pressure (!) 148/58, pulse 93, temperature 98.2 F (36.8 C), temperature source Oral, resp. rate 20, height 5' 6.5" (1.689 m), weight 65.3 kg, SpO2 98%. Awake, alert, oriented Speech fluent, appropriate CN grossly intact 5/5 BUE/BLE Wound c/d/i  Disposition: Discharge disposition: 01-Home or Self Care       Discharge Instructions     Call MD for:  redness, tenderness, or signs of infection (pain, swelling, redness, odor or green/yellow discharge around incision site)   Complete by: As directed    Call MD for:  temperature >100.4   Complete by: As directed    Diet - low sodium heart healthy   Complete by: As directed    Discharge instructions   Complete by: As directed    Walk at home as much as possible, at least 4 times / day   Incentive spirometry RT   Complete by: As directed    Increase activity slowly   Complete by: As directed    Lifting restrictions   Complete by: As directed    No lifting > 10 lbs   May shower / Bathe   Complete by: As directed    48 hours after surgery   May walk up steps   Complete by: As directed    Other Restrictions   Complete by: As directed    No bending/twisting at waist      Allergies as of 01/29/2023       Reactions   Yellow Jacket Venom [bee Venom] Anaphylaxis   Crestor [rosuvastatin] Other (See Comments)   aches   Lipitor [atorvastatin] Other (See Comments)   Joint pain.    Statins Other (See Comments)   aches   Welchol [colesevelam Hcl] Other (See Comments)   aches   Zocor [simvastatin] Other (See Comments)   aches        Medication List     TAKE these medications    acetaminophen 325 MG tablet Commonly known as: TYLENOL Take 2 tablets (650 mg total) by mouth every 4 (four) hours as needed for mild pain ((score 1 to 3) or temp > 100.5).   Advil 200 MG tablet Generic drug: ibuprofen Take 400 mg by mouth every 6 (six) hours as needed for mild pain or moderate pain.   amLODipine 2.5 MG tablet Commonly known as: NORVASC Take 1 tablet (2.5 mg total) by mouth daily. What changed: how much to take   DRY EYE RELIEF DROPS OP Place 1 drop into both eyes daily as needed (Dry eye).   ezetimibe 10 MG tablet Commonly known as: ZETIA Take 1 tablet (10 mg total) by mouth daily.   methocarbamol 500 MG tablet Commonly known as: ROBAXIN Take 1 tablet (500 mg total) by mouth every 6 (six) hours as needed for muscle spasms.   oxyCODONE 5 MG immediate release tablet Commonly known as: Oxy IR/ROXICODONE Take 1 tablet (5 mg total) by mouth every 3 (three) hours as needed for moderate pain ((score 4 to 6)).   PRESERVISION AREDS  2 PO Take 2 capsules by mouth daily. Chewable   tiZANidine 2 MG tablet Commonly known as: ZANAFLEX Take 2 mg by mouth at bedtime as needed for muscle spasms.        Follow-up Information     Lisbeth Renshaw, MD Follow up.   Specialty: Neurosurgery Contact information: [address] [place] [address] [phone]                 Signed: Jackelyn Hoehn 01/29/2023, 11:13 AM

## 2023-01-29 NOTE — Plan of Care (Signed)
  Problem: Education: Goal: Ability to verbalize activity precautions or restrictions will improve Outcome: Completed/Met Goal: Knowledge of the prescribed therapeutic regimen will improve Outcome: Completed/Met Goal: Understanding of discharge needs will improve Outcome: Completed/Met   Problem: Bowel/Gastric: Goal: Gastrointestinal status for postoperative course will improve Outcome: Completed/Met   Problem: Clinical Measurements: Goal: Ability to maintain clinical measurements within normal limits will improve Outcome: Completed/Met Goal: Postoperative complications will be avoided or minimized Outcome: Completed/Met Goal: Diagnostic test results will improve Outcome: Completed/Met   Problem: Pain Management: Goal: Pain level will decrease Outcome: Completed/Met   Problem: Bladder/Genitourinary: Goal: Urinary functional status for postoperative course will improve Outcome: Completed/Met

## 2023-01-29 NOTE — Progress Notes (Signed)
  NEUROSURGERY PROGRESS NOTE   No issues overnight. Pt reports significant improvement in back and leg pain.   EXAM:  BP (!) 148/58 (BP Location: Left Arm)   Pulse 93   Temp 98.2 F (36.8 C) (Oral)   Resp 20   Ht 5' 6.5" (1.689 m)   Wt 65.3 kg   SpO2 98%   BMI 22.89 kg/m   Awake, alert, oriented  Speech fluent, appropriate  CN grossly intact  5/5 BUE/BLE   IMPRESSION:  82 y.o. female POD#1 s/p L4-5 PLIF, significant improvement in preop pain  PLAN: - d/c home today   Lisbeth Renshaw, MD [facility]

## 2023-01-30 ENCOUNTER — Encounter (HOSPITAL_COMMUNITY): Payer: Self-pay | Admitting: Emergency Medicine

## 2023-01-30 ENCOUNTER — Other Ambulatory Visit: Payer: Self-pay

## 2023-01-30 ENCOUNTER — Inpatient Hospital Stay (HOSPITAL_COMMUNITY)
Admission: EM | Admit: 2023-01-30 | Discharge: 2023-02-03 | DRG: 402 | Disposition: A | Discharge status: Home / Self Care | Payer: Medicare PPO | Attending: Neurosurgery | Admitting: Neurosurgery

## 2023-01-30 DIAGNOSIS — N3289 Other specified disorders of bladder: Secondary | ICD-10-CM | POA: Diagnosis not present

## 2023-01-30 DIAGNOSIS — M4316 Spondylolisthesis, lumbar region: Principal | ICD-10-CM | POA: Diagnosis present

## 2023-01-30 DIAGNOSIS — E876 Hypokalemia: Secondary | ICD-10-CM | POA: Diagnosis not present

## 2023-01-30 DIAGNOSIS — D649 Anemia, unspecified: Secondary | ICD-10-CM | POA: Diagnosis present

## 2023-01-30 DIAGNOSIS — M5136 Other intervertebral disc degeneration, lumbar region with discogenic back pain only: Secondary | ICD-10-CM | POA: Diagnosis not present

## 2023-01-30 DIAGNOSIS — Z825 Family history of asthma and other chronic lower respiratory diseases: Secondary | ICD-10-CM

## 2023-01-30 DIAGNOSIS — G9761 Postprocedural hematoma of a nervous system organ or structure following a nervous system procedure: Secondary | ICD-10-CM | POA: Diagnosis present

## 2023-01-30 DIAGNOSIS — Z981 Arthrodesis status: Secondary | ICD-10-CM

## 2023-01-30 DIAGNOSIS — M48062 Spinal stenosis, lumbar region with neurogenic claudication: Secondary | ICD-10-CM | POA: Diagnosis present

## 2023-01-30 DIAGNOSIS — E871 Hypo-osmolality and hyponatremia: Secondary | ICD-10-CM | POA: Diagnosis not present

## 2023-01-30 DIAGNOSIS — Z8261 Family history of arthritis: Secondary | ICD-10-CM

## 2023-01-30 DIAGNOSIS — M2548 Effusion, other site: Secondary | ICD-10-CM | POA: Diagnosis not present

## 2023-01-30 DIAGNOSIS — Y838 Other surgical procedures as the cause of abnormal reaction of the patient, or of later complication, without mention of misadventure at the time of the procedure: Secondary | ICD-10-CM | POA: Diagnosis present

## 2023-01-30 DIAGNOSIS — F419 Anxiety disorder, unspecified: Secondary | ICD-10-CM | POA: Diagnosis present

## 2023-01-30 DIAGNOSIS — Z888 Allergy status to other drugs, medicaments and biological substances status: Secondary | ICD-10-CM

## 2023-01-30 DIAGNOSIS — Z79899 Other long term (current) drug therapy: Secondary | ICD-10-CM

## 2023-01-30 DIAGNOSIS — E039 Hypothyroidism, unspecified: Secondary | ICD-10-CM | POA: Diagnosis present

## 2023-01-30 DIAGNOSIS — G992 Myelopathy in diseases classified elsewhere: Secondary | ICD-10-CM | POA: Diagnosis present

## 2023-01-30 DIAGNOSIS — G9762 Postprocedural hematoma of a nervous system organ or structure following other procedure: Principal | ICD-10-CM | POA: Diagnosis present

## 2023-01-30 DIAGNOSIS — Z91048 Other nonmedicinal substance allergy status: Secondary | ICD-10-CM

## 2023-01-30 DIAGNOSIS — I10 Essential (primary) hypertension: Secondary | ICD-10-CM | POA: Diagnosis present

## 2023-01-30 DIAGNOSIS — G834 Cauda equina syndrome: Principal | ICD-10-CM | POA: Diagnosis present

## 2023-01-30 DIAGNOSIS — M4807 Spinal stenosis, lumbosacral region: Secondary | ICD-10-CM | POA: Diagnosis not present

## 2023-01-30 LAB — CBC WITH DIFFERENTIAL/PLATELET
Abs Immature Granulocytes: 0.06 10*3/uL (ref 0.00–0.07)
Basophils Absolute: 0.1 10*3/uL (ref 0.0–0.1)
Basophils Relative: 0 %
Eosinophils Absolute: 0 10*3/uL (ref 0.0–0.5)
Eosinophils Relative: 0 %
HCT: 33.5 % — ABNORMAL LOW (ref 36.0–46.0)
Hemoglobin: 11.3 g/dL — ABNORMAL LOW (ref 12.0–15.0)
Immature Granulocytes: 0 %
Lymphocytes Relative: 9 %
Lymphs Abs: 1.2 10*3/uL (ref 0.7–4.0)
MCH: 30.5 pg (ref 26.0–34.0)
MCHC: 33.7 g/dL (ref 30.0–36.0)
MCV: 90.3 fL (ref 80.0–100.0)
Monocytes Absolute: 1.3 10*3/uL — ABNORMAL HIGH (ref 0.1–1.0)
Monocytes Relative: 10 %
Neutro Abs: 11.2 10*3/uL — ABNORMAL HIGH (ref 1.7–7.7)
Neutrophils Relative %: 81 %
Platelets: 310 10*3/uL (ref 150–400)
RBC: 3.71 MIL/uL — ABNORMAL LOW (ref 3.87–5.11)
RDW: 13.4 % (ref 11.5–15.5)
WBC: 13.9 10*3/uL — ABNORMAL HIGH (ref 4.0–10.5)
nRBC: 0 % (ref 0.0–0.2)

## 2023-01-30 LAB — BASIC METABOLIC PANEL
Anion gap: 11 (ref 5–15)
BUN: 9 mg/dL (ref 8–23)
CO2: 22 mmol/L (ref 22–32)
Calcium: 9 mg/dL (ref 8.9–10.3)
Chloride: 94 mmol/L — ABNORMAL LOW (ref 98–111)
Creatinine, Ser: 0.58 mg/dL (ref 0.44–1.00)
GFR, Estimated: >60 mL/min (ref >60)
Glucose, Bld: 131 mg/dL — ABNORMAL HIGH (ref 70–99)
Potassium: 3.4 mmol/L — ABNORMAL LOW (ref 3.5–5.1)
Sodium: 127 mmol/L — ABNORMAL LOW (ref 135–145)

## 2023-01-30 MED ORDER — DEXAMETHASONE SODIUM PHOSPHATE 10 MG/ML IJ SOLN
10.0000 mg | Freq: Once | INTRAMUSCULAR | Status: AC
  Administered 2023-01-31: 10 mg via INTRAVENOUS
  Filled 2023-01-30: qty 1

## 2023-01-30 MED ORDER — MORPHINE SULFATE (PF) 4 MG/ML IV SOLN
4.0000 mg | Freq: Once | INTRAVENOUS | Status: AC
  Administered 2023-01-31: 4 mg via INTRAVENOUS
  Filled 2023-01-30: qty 1

## 2023-01-30 MED ORDER — LORAZEPAM 1 MG PO TABS
1.0000 mg | ORAL_TABLET | Freq: Once | ORAL | Status: AC
  Administered 2023-01-31: 1 mg via ORAL
  Filled 2023-01-30: qty 1

## 2023-01-30 NOTE — ED Provider Notes (Signed)
MSE was initiated and I personally evaluated the patient and placed orders (if any) at  10:34 PM on January 30, 2023.  I was made aware of patient coming by the neurosurgery NP.  Patient needs dose of dexamethasone (10 mg), MRI lumbar with and without due to weakness in the legs and difficulty urinating, concern for postoperative hematoma with surgery 2 days ago. These have been ordered.  The patient appears stable so that the remainder of the MSE may be completed by another provider.   Pricilla Loveless, MD 01/30/23 2239

## 2023-01-30 NOTE — ED Triage Notes (Signed)
Patient BIB [facility] for evaluation s/p inability to ambulate s/p spinal fusion on Thursday.  Having urinary retention.  Called neurologist and was instructed to come to ED for evaluation.  EMS states "she buckled on [place] when we tried to get her to walk."  No reports of numbness or tingling.

## 2023-01-30 NOTE — ED Provider Notes (Signed)
Cheryl Pearson EMERGENCY DEPARTMENT AT [facility] Provider Note   CSN: [id number] Arrival date & time: 01/30/23  2157     History {Add pertinent medical, surgical, social history, OB history to HPI:1} Chief Complaint  Patient presents with   Post-op Problem    Cheryl Pearson is a 82 y.o. female.  The history is provided by the patient.  Cheryl Pearson has a history of hypertension and is 2 days status post lumbar laminectomy and fusion and comes in because of leg weakness today.  Yesterday, she was ambulatory and able to climb steps.  Today, she noted increased pain in her lumbar area around where the incision was and noted that she is extremely weak to the point where she is not able to stand up.  She thinks that both legs are weak.  She also has not urinated since 11 AM and does not know if she needs to urinate or not.  As far she knows, there has not actually been any incontinence.   Home Medications Prior to Admission medications   Medication Sig Start Date End Date Taking? Authorizing Provider  acetaminophen (TYLENOL) 325 MG tablet Take 2 tablets (650 mg total) by mouth every 4 (four) hours as needed for mild pain ((score 1 to 3) or temp > 100.5). 05/06/18   Angiulli, Mcarthur Rossetti, PA-C  amLODipine (NORVASC) 2.5 MG tablet Take 1 tablet (2.5 mg total) by mouth daily. Patient taking differently: Take 5 mg by mouth daily. 05/06/18   Angiulli, Mcarthur Rossetti, PA-C  ezetimibe (ZETIA) 10 MG tablet Take 1 tablet (10 mg total) by mouth daily. 05/06/18   Angiulli, Mcarthur Rossetti, PA-C  Glycerin-Hypromellose-PEG 400 (DRY EYE RELIEF DROPS OP) Place 1 drop into both eyes daily as needed (Dry eye).    [provider]  ibuprofen (ADVIL) 200 MG tablet Take 400 mg by mouth every 6 (six) hours as needed for mild pain or moderate pain.    [provider]  methocarbamol (ROBAXIN) 500 MG tablet Take 1 tablet (500 mg total) by mouth every 6 (six) hours as needed for muscle spasms. 01/29/23   Lisbeth Renshaw, MD  Multiple Vitamins-Minerals (PRESERVISION AREDS 2 PO) Take 2 capsules by mouth daily. Chewable    [provider]  oxyCODONE (OXY IR/ROXICODONE) 5 MG immediate release tablet Take 1 tablet (5 mg total) by mouth every 3 (three) hours as needed for moderate pain ((score 4 to 6)). 01/29/23   Lisbeth Renshaw, MD  tiZANidine (ZANAFLEX) 2 MG tablet Take 2 mg by mouth at bedtime as needed for muscle spasms.    [provider]      Allergies    Yellow jacket venom [bee venom], Crestor [rosuvastatin], Lipitor [atorvastatin], Statins, Welchol [colesevelam hcl], and Zocor [simvastatin]    Review of Systems   Review of Systems  All other systems reviewed and are negative.   Physical Exam Updated Vital Signs BP (!) 145/55 (BP Location: Left Arm)   Pulse (!) 101   Temp 99 F (37.2 C) (Oral)   Resp 20   Ht 5\' 6"  (1.676 m)   Wt 65.3 kg   SpO2 96%   BMI 23.24 kg/m  Physical Exam Vitals and nursing note reviewed.   82 year old female, resting comfortably and in no acute distress. Vital signs are significant for mildly elevated blood pressure and borderline elevated heart rate. Oxygen saturation is 96%, which is normal. Head is normocephalic and atraumatic. PERRLA, EOMI.  Neck is nontender and supple without adenopathy  or JVD. Back: She has a brace in place and it is not removed, I have not evaluated the incision. Lungs are clear without rales, wheezes, or rhonchi. Chest is nontender. Heart has regular rate and rhythm with 2-3/6 systolic ejection murmur best heard along the left sternal border. Abdomen is soft, flat, nontender. Extremities have trace edema. Skin is warm and dry without rash. Neurologic: Awake and alert, speech is normal.  Cranial nerves are intact.  She has decreased pinprick sensation in both legs with normal sensation starting at the proximal thigh.  Strength is 3/6 throughout both legs and appears to be symmetric but is difficult to assess  because of pain.  ED Results / Procedures / Treatments   Labs (all labs ordered are listed, but only abnormal results are displayed) Labs Reviewed  CBC WITH DIFFERENTIAL/PLATELET - Abnormal; Notable for the following components:      Result Value   WBC 13.9 (*)    RBC 3.71 (*)    Hemoglobin 11.3 (*)    HCT 33.5 (*)    Neutro Abs 11.2 (*)    Monocytes Absolute 1.3 (*)    All other components within normal limits  BASIC METABOLIC PANEL    EKG None  Radiology No results found.  Procedures Procedures  {Document cardiac monitor, telemetry assessment procedure when appropriate:1}  Medications Ordered in ED Medications  dexamethasone (DECADRON) injection 10 mg (has no administration in time range)    ED Course/ Medical Decision Making/ A&P   {   Click here for ABCD2, HEART and other calculatorsREFRESH Note before signing :1}                              Medical Decision Making  Bilateral leg weakness and patient who is 2 days postoperative lumbar surgery.  Concern for postoperative hematoma.  I have ordered morphine for pain, oral lorazepam for sedation.  I have reviewed her laboratory test and my interpretation is hyponatremia which has progressed slightly but is not felt to be clinically significant, mild hypokalemia and have ordered a dose of oral potassium, elevated random glucose which will need to be followed as an outpatient, mild leukocytosis which is nonspecific, mild anemia with 2 g drop compared with 9/25.  MRI of lumbar spine has been ordered.  I have reviewed her past records and note admission for lumbar laminectomy and posterior fusion L4-L5 on 01/28/2023.  {Document critical care time when appropriate:1} {Document review of labs and clinical decision tools ie heart score, Chads2Vasc2 etc:1}  {Document your independent review of radiology images, and any outside records:1} {Document your discussion with family members, caretakers, and with consultants:1} {Document  social determinants of health affecting pt's care:1} {Document your decision making why or why not admission, treatments were needed:1} Final Clinical Impression(s) / ED Diagnoses Final diagnoses:  None    Rx / DC Orders ED Discharge Orders     None

## 2023-01-30 NOTE — ED Notes (Addendum)
Patient roomed

## 2023-01-31 ENCOUNTER — Encounter (HOSPITAL_COMMUNITY)
Admission: EM | Disposition: A | Discharge status: Home / Self Care | Payer: Self-pay | Source: Home / Self Care | Attending: Neurosurgery

## 2023-01-31 ENCOUNTER — Encounter (HOSPITAL_COMMUNITY): Payer: Self-pay | Admitting: *Deleted

## 2023-01-31 ENCOUNTER — Emergency Department (HOSPITAL_COMMUNITY): Payer: Medicare PPO | Admitting: Anesthesiology

## 2023-01-31 ENCOUNTER — Emergency Department (HOSPITAL_COMMUNITY): Payer: Medicare PPO

## 2023-01-31 ENCOUNTER — Other Ambulatory Visit: Payer: Self-pay

## 2023-01-31 DIAGNOSIS — G834 Cauda equina syndrome: Secondary | ICD-10-CM | POA: Diagnosis present

## 2023-01-31 DIAGNOSIS — I10 Essential (primary) hypertension: Secondary | ICD-10-CM | POA: Diagnosis present

## 2023-01-31 DIAGNOSIS — Z8261 Family history of arthritis: Secondary | ICD-10-CM | POA: Diagnosis not present

## 2023-01-31 DIAGNOSIS — L7622 Postprocedural hemorrhage and hematoma of skin and subcutaneous tissue following other procedure: Secondary | ICD-10-CM

## 2023-01-31 DIAGNOSIS — Z825 Family history of asthma and other chronic lower respiratory diseases: Secondary | ICD-10-CM | POA: Diagnosis not present

## 2023-01-31 DIAGNOSIS — M48062 Spinal stenosis, lumbar region with neurogenic claudication: Secondary | ICD-10-CM | POA: Diagnosis present

## 2023-01-31 DIAGNOSIS — Z79899 Other long term (current) drug therapy: Secondary | ICD-10-CM | POA: Diagnosis not present

## 2023-01-31 DIAGNOSIS — E876 Hypokalemia: Secondary | ICD-10-CM | POA: Diagnosis present

## 2023-01-31 DIAGNOSIS — M2548 Effusion, other site: Secondary | ICD-10-CM | POA: Diagnosis not present

## 2023-01-31 DIAGNOSIS — M4807 Spinal stenosis, lumbosacral region: Secondary | ICD-10-CM | POA: Diagnosis not present

## 2023-01-31 DIAGNOSIS — N3289 Other specified disorders of bladder: Secondary | ICD-10-CM | POA: Diagnosis not present

## 2023-01-31 DIAGNOSIS — G9761 Postprocedural hematoma of a nervous system organ or structure following a nervous system procedure: Secondary | ICD-10-CM | POA: Diagnosis present

## 2023-01-31 DIAGNOSIS — E871 Hypo-osmolality and hyponatremia: Secondary | ICD-10-CM | POA: Diagnosis present

## 2023-01-31 DIAGNOSIS — Z888 Allergy status to other drugs, medicaments and biological substances status: Secondary | ICD-10-CM | POA: Diagnosis not present

## 2023-01-31 DIAGNOSIS — Z91048 Other nonmedicinal substance allergy status: Secondary | ICD-10-CM | POA: Diagnosis not present

## 2023-01-31 DIAGNOSIS — Y838 Other surgical procedures as the cause of abnormal reaction of the patient, or of later complication, without mention of misadventure at the time of the procedure: Secondary | ICD-10-CM | POA: Diagnosis present

## 2023-01-31 DIAGNOSIS — M5136 Other intervertebral disc degeneration, lumbar region with discogenic back pain only: Secondary | ICD-10-CM | POA: Diagnosis not present

## 2023-01-31 DIAGNOSIS — F419 Anxiety disorder, unspecified: Secondary | ICD-10-CM | POA: Diagnosis present

## 2023-01-31 DIAGNOSIS — M4316 Spondylolisthesis, lumbar region: Secondary | ICD-10-CM | POA: Diagnosis present

## 2023-01-31 DIAGNOSIS — G992 Myelopathy in diseases classified elsewhere: Secondary | ICD-10-CM | POA: Diagnosis present

## 2023-01-31 DIAGNOSIS — Z981 Arthrodesis status: Secondary | ICD-10-CM | POA: Diagnosis not present

## 2023-01-31 DIAGNOSIS — E039 Hypothyroidism, unspecified: Secondary | ICD-10-CM | POA: Diagnosis present

## 2023-01-31 HISTORY — PX: LUMBAR LAMINECTOMY FOR EPIDURAL ABSCESS: SHX5956

## 2023-01-31 LAB — URINALYSIS, ROUTINE W REFLEX MICROSCOPIC
Bilirubin Urine: NEGATIVE
Glucose, UA: >=500 mg/dL — AB
Hgb urine dipstick: NEGATIVE
Ketones, ur: 20 mg/dL — AB
Leukocytes,Ua: NEGATIVE
Nitrite: NEGATIVE
Protein, ur: 100 mg/dL — AB
Specific Gravity, Urine: 1.026 (ref 1.005–1.030)
pH: 5 (ref 5.0–8.0)

## 2023-01-31 SURGERY — LUMBAR LAMINECTOMY FOR EPIDURAL ABSCESS
Anesthesia: General | Site: Back

## 2023-01-31 MED ORDER — ONDANSETRON HCL 4 MG PO TABS: 4.0000 mg | ORAL_TABLET | Freq: Four times a day (QID) | ORAL | Status: DC | PRN

## 2023-01-31 MED ORDER — THROMBIN 20000 UNITS EX SOLR
CUTANEOUS | Status: AC
  Filled 2023-01-31: qty 20000

## 2023-01-31 MED ORDER — SUGAMMADEX SODIUM 200 MG/2ML IV SOLN
INTRAVENOUS | Status: DC | PRN
  Administered 2023-01-31: 200 mg via INTRAVENOUS

## 2023-01-31 MED ORDER — PROPOFOL 10 MG/ML IV BOLUS
INTRAVENOUS | Status: DC | PRN
  Administered 2023-01-31: 100 mg via INTRAVENOUS

## 2023-01-31 MED ORDER — ONDANSETRON HCL 4 MG/2ML IJ SOLN: 4.0000 mg | Freq: Four times a day (QID) | INTRAMUSCULAR | Status: DC | PRN

## 2023-01-31 MED ORDER — LACTATED RINGERS IV SOLN: INTRAVENOUS | Status: DC

## 2023-01-31 MED ORDER — CEFAZOLIN SODIUM-DEXTROSE 2-4 GM/100ML-% IV SOLN
2.0000 g | INTRAVENOUS | Status: AC
  Administered 2023-01-31: 2 g via INTRAVENOUS

## 2023-01-31 MED ORDER — MENTHOL 3 MG MT LOZG: 1.0000 | LOZENGE | OROMUCOSAL | Status: DC | PRN

## 2023-01-31 MED ORDER — SODIUM CHLORIDE 0.9 % IV SOLN
250.0000 mL | INTRAVENOUS | Status: DC
  Administered 2023-01-31: 250 mL via INTRAVENOUS

## 2023-01-31 MED ORDER — FENTANYL CITRATE (PF) 100 MCG/2ML IJ SOLN
25.0000 ug | INTRAMUSCULAR | Status: DC | PRN
  Administered 2023-01-31: 25 ug via INTRAVENOUS

## 2023-01-31 MED ORDER — SODIUM CHLORIDE 0.9% FLUSH: 3.0000 mL | INTRAVENOUS | Status: DC | PRN

## 2023-01-31 MED ORDER — CHLORHEXIDINE GLUCONATE 0.12 % MT SOLN
OROMUCOSAL | Status: AC
  Administered 2023-01-31: 15 mL via OROMUCOSAL
  Filled 2023-01-31: qty 15

## 2023-01-31 MED ORDER — THROMBIN 5000 UNITS EX SOLR
CUTANEOUS | Status: AC
  Filled 2023-01-31: qty 5000

## 2023-01-31 MED ORDER — CHLORHEXIDINE GLUCONATE 0.12 % MT SOLN: 15.0000 mL | Freq: Once | OROMUCOSAL | Status: AC

## 2023-01-31 MED ORDER — DEXAMETHASONE SODIUM PHOSPHATE 10 MG/ML IJ SOLN
INTRAMUSCULAR | Status: DC | PRN
  Administered 2023-01-31: 8 mg via INTRAVENOUS

## 2023-01-31 MED ORDER — ONDANSETRON HCL 4 MG/2ML IJ SOLN
INTRAMUSCULAR | Status: DC | PRN
Start: 2023-01-31 — End: 2023-01-31
  Administered 2023-01-31: 4 mg via INTRAVENOUS

## 2023-01-31 MED ORDER — ONDANSETRON HCL 4 MG/2ML IJ SOLN
INTRAMUSCULAR | Status: AC
  Filled 2023-01-31: qty 2

## 2023-01-31 MED ORDER — GADOBUTROL 1 MMOL/ML IV SOLN
6.5000 mL | Freq: Once | INTRAVENOUS | Status: AC | PRN
  Administered 2023-01-31: 6.5 mL via INTRAVENOUS

## 2023-01-31 MED ORDER — FLEET ENEMA RE ENEM: 1.0000 | ENEMA | Freq: Once | RECTAL | Status: DC | PRN

## 2023-01-31 MED ORDER — GLYCERIN-HYPROMELLOSE-PEG 400 0.2-0.2-1 % OP SOLN: 1.0000 [drp] | Freq: Every day | OPHTHALMIC | Status: DC | PRN

## 2023-01-31 MED ORDER — CEFAZOLIN SODIUM-DEXTROSE 2-4 GM/100ML-% IV SOLN
INTRAVENOUS | Status: AC
  Filled 2023-01-31: qty 100

## 2023-01-31 MED ORDER — POLYETHYLENE GLYCOL 3350 17 G PO PACK
17.0000 g | PACK | Freq: Every day | ORAL | Status: DC | PRN
  Administered 2023-02-02 – 2023-02-03 (×2): 17 g via ORAL
  Filled 2023-01-31 (×2): qty 1

## 2023-01-31 MED ORDER — CEFAZOLIN SODIUM-DEXTROSE 1-4 GM/50ML-% IV SOLN
1.0000 g | Freq: Three times a day (TID) | INTRAVENOUS | Status: AC
  Administered 2023-01-31 (×2): 1 g via INTRAVENOUS
  Filled 2023-01-31 (×2): qty 50

## 2023-01-31 MED ORDER — EZETIMIBE 10 MG PO TABS
10.0000 mg | ORAL_TABLET | Freq: Every day | ORAL | Status: DC
  Administered 2023-01-31 – 2023-02-03 (×4): 10 mg via ORAL
  Filled 2023-01-31 (×4): qty 1

## 2023-01-31 MED ORDER — ROCURONIUM BROMIDE 10 MG/ML (PF) SYRINGE
PREFILLED_SYRINGE | INTRAVENOUS | Status: DC | PRN
  Administered 2023-01-31: 40 mg via INTRAVENOUS

## 2023-01-31 MED ORDER — BUPIVACAINE HCL (PF) 0.25 % IJ SOLN
INTRAMUSCULAR | Status: AC
  Filled 2023-01-31: qty 30

## 2023-01-31 MED ORDER — VANCOMYCIN HCL 1000 MG IV SOLR
INTRAVENOUS | Status: AC
  Filled 2023-01-31: qty 20

## 2023-01-31 MED ORDER — IBUPROFEN 200 MG PO TABS: 400.0000 mg | ORAL_TABLET | Freq: Four times a day (QID) | ORAL | Status: DC | PRN

## 2023-01-31 MED ORDER — PHENYLEPHRINE HCL-NACL 20-0.9 MG/250ML-% IV SOLN
INTRAVENOUS | Status: DC | PRN
Start: 2023-01-31 — End: 2023-01-31
  Administered 2023-01-31: 40 ug/min via INTRAVENOUS

## 2023-01-31 MED ORDER — FENTANYL CITRATE (PF) 250 MCG/5ML IJ SOLN
INTRAMUSCULAR | Status: DC | PRN
  Administered 2023-01-31: 150 ug via INTRAVENOUS

## 2023-01-31 MED ORDER — ORAL CARE MOUTH RINSE: 15.0000 mL | Freq: Once | OROMUCOSAL | Status: AC

## 2023-01-31 MED ORDER — ACETAMINOPHEN 650 MG RE SUPP: 650.0000 mg | RECTAL | Status: DC | PRN

## 2023-01-31 MED ORDER — ACETAMINOPHEN 325 MG PO TABS
650.0000 mg | ORAL_TABLET | ORAL | Status: DC | PRN
  Administered 2023-01-31 – 2023-02-01 (×3): 650 mg via ORAL
  Administered 2023-02-02: 325 mg via ORAL
  Administered 2023-02-02 (×2): 650 mg via ORAL
  Filled 2023-01-31 (×6): qty 2

## 2023-01-31 MED ORDER — 0.9 % SODIUM CHLORIDE (POUR BTL) OPTIME
TOPICAL | Status: DC | PRN
  Administered 2023-01-31: 1000 mL

## 2023-01-31 MED ORDER — FENTANYL CITRATE (PF) 250 MCG/5ML IJ SOLN
INTRAMUSCULAR | Status: AC
  Filled 2023-01-31: qty 5

## 2023-01-31 MED ORDER — HYDROMORPHONE HCL 1 MG/ML IJ SOLN: 1.0000 mg | INTRAMUSCULAR | Status: DC | PRN

## 2023-01-31 MED ORDER — PROPOFOL 10 MG/ML IV BOLUS
INTRAVENOUS | Status: AC
  Filled 2023-01-31: qty 20

## 2023-01-31 MED ORDER — AMLODIPINE BESYLATE 5 MG PO TABS
5.0000 mg | ORAL_TABLET | Freq: Every day | ORAL | Status: DC
  Administered 2023-01-31 – 2023-02-03 (×3): 5 mg via ORAL
  Filled 2023-01-31 (×4): qty 1

## 2023-01-31 MED ORDER — SODIUM CHLORIDE 0.9% FLUSH
3.0000 mL | Freq: Two times a day (BID) | INTRAVENOUS | Status: DC
  Administered 2023-01-31 – 2023-02-03 (×6): 3 mL via INTRAVENOUS

## 2023-01-31 MED ORDER — MORPHINE SULFATE (PF) 4 MG/ML IV SOLN
4.0000 mg | Freq: Once | INTRAVENOUS | Status: DC
  Filled 2023-01-31: qty 1

## 2023-01-31 MED ORDER — FENTANYL CITRATE (PF) 100 MCG/2ML IJ SOLN
INTRAMUSCULAR | Status: AC
  Filled 2023-01-31: qty 2

## 2023-01-31 MED ORDER — VANCOMYCIN HCL 1000 MG IV SOLR
INTRAVENOUS | Status: DC | PRN
  Administered 2023-01-31: 1000 mg via TOPICAL

## 2023-01-31 MED ORDER — OXYCODONE HCL 5 MG PO TABS
5.0000 mg | ORAL_TABLET | ORAL | Status: DC | PRN
  Filled 2023-01-31: qty 1

## 2023-01-31 MED ORDER — THROMBIN 20000 UNITS EX SOLR: CUTANEOUS | Status: DC | PRN

## 2023-01-31 MED ORDER — METHOCARBAMOL 500 MG PO TABS: 500.0000 mg | ORAL_TABLET | Freq: Four times a day (QID) | ORAL | Status: DC | PRN

## 2023-01-31 MED ORDER — ACETAMINOPHEN 10 MG/ML IV SOLN: 1000.0000 mg | Freq: Once | INTRAVENOUS | Status: DC

## 2023-01-31 MED ORDER — PHENOL 1.4 % MT LIQD: 1.0000 | OROMUCOSAL | Status: DC | PRN

## 2023-01-31 MED ORDER — PHENYLEPHRINE 80 MCG/ML (10ML) SYRINGE FOR IV PUSH (FOR BLOOD PRESSURE SUPPORT)
PREFILLED_SYRINGE | INTRAVENOUS | Status: DC | PRN
  Administered 2023-01-31: 160 ug via INTRAVENOUS

## 2023-01-31 MED ORDER — DEXAMETHASONE SODIUM PHOSPHATE 10 MG/ML IJ SOLN
INTRAMUSCULAR | Status: AC
  Filled 2023-01-31: qty 1

## 2023-01-31 MED ORDER — PRESERVISION AREDS 2 PO CAPS: ORAL_CAPSULE | Freq: Every day | ORAL | Status: DC

## 2023-01-31 MED ORDER — AMISULPRIDE (ANTIEMETIC) 5 MG/2ML IV SOLN: 10.0000 mg | Freq: Once | INTRAVENOUS | Status: DC | PRN

## 2023-01-31 MED ORDER — BISACODYL 10 MG RE SUPP: 10.0000 mg | Freq: Every day | RECTAL | Status: DC | PRN

## 2023-01-31 MED ORDER — LIDOCAINE 2% (20 MG/ML) 5 ML SYRINGE
INTRAMUSCULAR | Status: DC | PRN
  Administered 2023-01-31: 60 mg via INTRAVENOUS

## 2023-01-31 MED ORDER — THROMBIN 5000 UNITS EX SOLR: OROMUCOSAL | Status: DC | PRN

## 2023-01-31 MED ORDER — ONDANSETRON HCL 4 MG/2ML IJ SOLN: 4.0000 mg | Freq: Once | INTRAMUSCULAR | Status: DC | PRN

## 2023-01-31 SURGICAL SUPPLY — 51 items
ADH SKN CLS APL DERMABOND .7 (GAUZE/BANDAGES/DRESSINGS) ×1
ADH SKN CLS LQ APL DERMABOND (GAUZE/BANDAGES/DRESSINGS) ×1
APL SKNCLS STERI-STRIP NONHPOA (GAUZE/BANDAGES/DRESSINGS) ×1
BAG COUNTER SPONGE SURGICOUNT (BAG) ×1 IMPLANT
BAG DECANTER FOR FLEXI CONT (MISCELLANEOUS) ×1 IMPLANT
BAG SPNG CNTER NS LX DISP (BAG) ×1
BENZOIN TINCTURE PRP APPL 2/3 (GAUZE/BANDAGES/DRESSINGS) ×1 IMPLANT
BLADE CLIPPER SURG (BLADE) IMPLANT
BUR CUTTER 7.0 ROUND (BURR) ×1 IMPLANT
CANISTER SUCT 3000ML PPV (MISCELLANEOUS) ×1 IMPLANT
DERMABOND ADVANCED .7 DNX12 (GAUZE/BANDAGES/DRESSINGS) ×1 IMPLANT
DERMABOND ADVANCED .7 DNX6 (GAUZE/BANDAGES/DRESSINGS) IMPLANT
DRAPE HALF SHEET 40X57 (DRAPES) IMPLANT
DRAPE LAPAROTOMY 100X72X124 (DRAPES) ×1 IMPLANT
DRAPE MICROSCOPE SLANT 54X150 (MISCELLANEOUS) ×1 IMPLANT
DRAPE SURG 17X23 STRL (DRAPES) ×2 IMPLANT
DRSG OPSITE POSTOP 4X6 (GAUZE/BANDAGES/DRESSINGS) IMPLANT
DURAPREP 26ML APPLICATOR (WOUND CARE) ×1 IMPLANT
ELECT REM PT RETURN 9FT ADLT (ELECTROSURGICAL) ×1
ELECTRODE REM PT RTRN 9FT ADLT (ELECTROSURGICAL) ×1 IMPLANT
GAUZE 4X4 16PLY ~~LOC~~+RFID DBL (SPONGE) IMPLANT
GAUZE SPONGE 4X4 12PLY STRL (GAUZE/BANDAGES/DRESSINGS) ×1 IMPLANT
GLOVE BIO SURGEON STRL SZ 6.5 (GLOVE) ×1 IMPLANT
GLOVE BIO SURGEON STRL SZ7 (GLOVE) IMPLANT
GLOVE BIOGEL PI IND STRL 6.5 (GLOVE) ×1 IMPLANT
GLOVE ECLIPSE 9.0 STRL (GLOVE) ×1 IMPLANT
GLOVE EXAM NITRILE XL STR (GLOVE) IMPLANT
GOWN STRL REUS W/ TWL LRG LVL3 (GOWN DISPOSABLE) IMPLANT
GOWN STRL REUS W/ TWL XL LVL3 (GOWN DISPOSABLE) ×1 IMPLANT
GOWN STRL REUS W/TWL 2XL LVL3 (GOWN DISPOSABLE) IMPLANT
GOWN STRL REUS W/TWL LRG LVL3 (GOWN DISPOSABLE) ×2
GOWN STRL REUS W/TWL XL LVL3 (GOWN DISPOSABLE) ×1
KIT BASIN OR (CUSTOM PROCEDURE TRAY) ×1 IMPLANT
KIT TURNOVER KIT B (KITS) ×1 IMPLANT
NDL HYPO 22X1.5 SAFETY MO (MISCELLANEOUS) ×1 IMPLANT
NDL SPNL 22GX3.5 QUINCKE BK (NEEDLE) ×1 IMPLANT
NEEDLE HYPO 22X1.5 SAFETY MO (MISCELLANEOUS) ×1 IMPLANT
NEEDLE SPNL 22GX3.5 QUINCKE BK (NEEDLE) ×1 IMPLANT
NS IRRIG 1000ML POUR BTL (IV SOLUTION) ×1 IMPLANT
PACK LAMINECTOMY NEURO (CUSTOM PROCEDURE TRAY) ×1 IMPLANT
PAD ARMBOARD 7.5X6 YLW CONV (MISCELLANEOUS) ×3 IMPLANT
SPIKE FLUID TRANSFER (MISCELLANEOUS) ×1 IMPLANT
SPONGE SURGIFOAM ABS GEL SZ50 (HEMOSTASIS) ×1 IMPLANT
STRIP CLOSURE SKIN 1/2X4 (GAUZE/BANDAGES/DRESSINGS) ×1 IMPLANT
SUT VIC AB 2-0 CT1 18 (SUTURE) ×1 IMPLANT
SUT VIC AB 3-0 SH 8-18 (SUTURE) ×1 IMPLANT
SWAB COLLECTION DEVICE MRSA (MISCELLANEOUS) IMPLANT
SWAB CULTURE ESWAB REG 1ML (MISCELLANEOUS) IMPLANT
TOWEL GREEN STERILE (TOWEL DISPOSABLE) ×1 IMPLANT
TOWEL GREEN STERILE FF (TOWEL DISPOSABLE) ×1 IMPLANT
WATER STERILE IRR 1000ML POUR (IV SOLUTION) ×1 IMPLANT

## 2023-01-31 NOTE — Anesthesia Postprocedure Evaluation (Signed)
Anesthesia Post Note  Patient: Cheryl Pearson  Procedure(s) Performed: LUMBAR WOUND RE-EXPLORATION AND EVACUATION OF HEMATOMA (Back)     Patient location during evaluation: PACU Anesthesia Type: General Level of consciousness: awake Pain management: pain level controlled Vital Signs Assessment: post-procedure vital signs reviewed and stable Respiratory status: spontaneous breathing, nonlabored ventilation and respiratory function stable Cardiovascular status: blood pressure returned to baseline and stable Postop Assessment: no apparent nausea or vomiting Anesthetic complications: no   No notable events documented.  Last Vitals:  Vitals:   01/31/23 1045 01/31/23 1121  BP: (!) 128/53 138/67  Pulse: 80 90  Resp: 14   Temp: 36.7 C 37 C  SpO2: 100% 98%    Last Pain:  Vitals:   01/31/23 1121  TempSrc: Oral  PainSc: 1                  Ranon Coven P Solomon Skowronek

## 2023-01-31 NOTE — Anesthesia Procedure Notes (Signed)
Procedure Name: Intubation Date/Time: 01/31/2023 7:48 AM  Performed by: Jodell Cipro, CRNAPre-anesthesia Checklist: Patient identified, Emergency Drugs available, Suction available and Patient being monitored Patient Re-evaluated:Patient Re-evaluated prior to induction Oxygen Delivery Method: Circle System Utilized Preoxygenation: Pre-oxygenation with 100% oxygen Induction Type: IV induction Ventilation: Mask ventilation without difficulty Laryngoscope Size: Mac and 3 Grade View: Grade II Tube type: Oral Tube size: 7.0 mm Number of attempts: 1 Airway Equipment and Method: Stylet and Oral airway Placement Confirmation: ETT inserted through vocal cords under direct vision, positive ETCO2 and breath sounds checked- equal and bilateral Secured at: 21 cm Tube secured with: Tape Dental Injury: Teeth and Oropharynx as per pre-operative assessment  Comments: Abnormal palate preop baseline. Intubated without difficulty

## 2023-01-31 NOTE — Progress Notes (Signed)
Ortho tech came to give patient Lumbar brace, but pt already had one from surgery last week.  Please make sure the order is out or taken care of so patient does not get charged.

## 2023-01-31 NOTE — Progress Notes (Signed)
Wasted 75 mcg Fentanyl with Georgina Snell, RN as witness.

## 2023-01-31 NOTE — Anesthesia Preprocedure Evaluation (Addendum)
Anesthesia Evaluation  Patient identified by MRN, date of birth, ID band Patient awake    Reviewed: Allergy & Precautions, NPO status , Patient's Chart, lab work & pertinent test results  Airway Mallampati: II  TM Distance: >3 FB Neck ROM: Full    Dental no notable dental hx.    Pulmonary neg pulmonary ROS   Pulmonary exam normal        Cardiovascular hypertension, Pt. on medications Normal cardiovascular exam     Neuro/Psych   Anxiety     worsening back pain with associated weakness and urinary retention    GI/Hepatic negative GI ROS, Neg liver ROS,,,  Endo/Other  hyponatremia  Renal/GU negative Renal ROS     Musculoskeletal  (+) Arthritis ,    Abdominal   Peds  Hematology  (+) Blood dyscrasia, anemia   Anesthesia Other Findings LUMBAR WOUND  Reproductive/Obstetrics                             Anesthesia Physical Anesthesia Plan  ASA: 3 and emergent  Anesthesia Plan: General   Post-op Pain Management:    Induction: Intravenous  PONV Risk Score and Plan: 3 and Ondansetron, Dexamethasone and Treatment may vary due to age or medical condition  Airway Management Planned: Oral ETT  Additional Equipment:   Intra-op Plan:   Post-operative Plan: Extubation in OR  Informed Consent: I have reviewed the patients History and Physical, chart, labs and discussed the procedure including the risks, benefits and alternatives for the proposed anesthesia with the patient or authorized representative who has indicated his/her understanding and acceptance.     Dental advisory given and Consent reviewed with POA  Plan Discussed with: CRNA  Anesthesia Plan Comments:         Anesthesia Quick Evaluation

## 2023-01-31 NOTE — H&P (Signed)
Cheryl Pearson is an 82 y.o. female.   Chief Complaint: Postop back pain HPI: 82 year old female recently status post L4-5 decompression fusion surgery presents with worsening back pain with associated weakness and urinary retention.  Symptoms began approximately 24 hours ago.  No precipitating event.  The patient had been up ambulatory climbing stairs and doing well but now is no longer ambulatory.  She has had a MRI scan in the emergency department which demonstrates evidence of significant dorsal postoperative fluid collection worrisome for epidural hematoma.    Past Medical History:  Diagnosis Date   Anxiety    Arthritis    Colitis    Early age-related macular degeneration    Hernia    Hypertension    Infertility, female    Menopausal symptoms    Rosacea    Seasonal allergies    pt states she "sneezes and coughs alot"   Thyroid disease 1968   hypothyroidism treated with meds for short time then all labs normal    Past Surgical History:  Procedure Laterality Date   bone spur Left    on left foot   COLONOSCOPY  11/2011   polyp, ulcerative coliits inactive   COLONOSCOPY  2005   COLONOSCOPY  1970's   diagnosed with ulcerative colitis   ESOPHAGOGASTRODUODENOSCOPY (EGD) WITH PROPOFOL N/A 11/20/2014   Procedure: ESOPHAGOGASTRODUODENOSCOPY (EGD) WITH PROPOFOL;  Surgeon: Carman Ching, MD;  Location: [facility];  Service: Endoscopy;  Laterality: N/A;   INGUINAL HERNIA REPAIR Right 33 mos old   LAPAROSCOPIC CHOLECYSTECTOMY  09/1998   SAVORY DILATION N/A 11/20/2014   Procedure: SAVORY DILATION;  Surgeon: Carman Ching, MD;  Location: [facility];  Service: Endoscopy;  Laterality: N/A;   THORACIC FUSION  03/2018   WISDOM TOOTH EXTRACTION  1968    Family History  Problem Relation Age of Onset   Cancer Mother        uterine cancer   Osteoarthritis Mother    Rheum arthritis Mother    COPD Mother    COPD Father    COPD Brother    Cancer Maternal Aunt        colon cancer    Social History:  reports that she has never smoked. She has never used smokeless tobacco. She reports current alcohol use of about 1.0 standard drink of alcohol per week. She reports that she does not use drugs.  Allergies:  Allergies  Allergen Reactions   Yellow Jacket Venom [Bee Venom] Anaphylaxis   Crestor [Rosuvastatin] Other (See Comments)    aches   Lipitor [Atorvastatin] Other (See Comments)    Joint pain.   Statins Other (See Comments)    aches   Welchol [Colesevelam Hcl] Other (See Comments)    aches   Zocor [Simvastatin] Other (See Comments)    aches    (Not in a hospital admission)   Results for orders placed or performed during the hospital encounter of 01/30/23 (from the past 48 hour(s))  Basic metabolic panel     Status: Abnormal   Collection Time: 01/30/23 10:56 PM  Result Value Ref Range   Sodium 127 (L) 135 - 145 mmol/L   Potassium 3.4 (L) 3.5 - 5.1 mmol/L   Chloride 94 (L) 98 - 111 mmol/L   CO2 22 22 - 32 mmol/L   Glucose, Bld 131 (H) 70 - 99 mg/dL    Comment: Glucose reference range applies only to samples taken after fasting for at least 8 hours.   BUN 9 8 - 23 mg/dL  Creatinine, Ser 0.58 0.44 - 1.00 mg/dL   Calcium 9.0 8.9 - 16.1 mg/dL   GFR, Estimated >09 >60 mL/min    Comment: (NOTE) Calculated using the CKD-EPI Creatinine Equation (2021)    Anion gap 11 5 - 15    Comment: Performed at [facility], [address], [place], [place] [address]  CBC with Differential     Status: Abnormal   Collection Time: 01/30/23 10:56 PM  Result Value Ref Range   WBC 13.9 (H) 4.0 - 10.5 K/uL   RBC 3.71 (L) 3.87 - 5.11 MIL/uL   Hemoglobin 11.3 (L) 12.0 - 15.0 g/dL   HCT 81.1 (L) 91.4 - 78.2 %   MCV 90.3 80.0 - 100.0 fL   MCH 30.5 26.0 - 34.0 pg   MCHC 33.7 30.0 - 36.0 g/dL   RDW 95.6 21.3 - 08.6 %   Platelets 310 150 - 400 K/uL   nRBC 0.0 0.0 - 0.2 %   Neutrophils Relative % 81 %   Neutro Abs 11.2 (H) 1.7 - 7.7 K/uL   Lymphocytes Relative 9 %    Lymphs Abs 1.2 0.7 - 4.0 K/uL   Monocytes Relative 10 %   Monocytes Absolute 1.3 (H) 0.1 - 1.0 K/uL   Eosinophils Relative 0 %   Eosinophils Absolute 0.0 0.0 - 0.5 K/uL   Basophils Relative 0 %   Basophils Absolute 0.1 0.0 - 0.1 K/uL   Immature Granulocytes 0 %   Abs Immature Granulocytes 0.06 0.00 - 0.07 K/uL    Comment: Performed at [facility], [address] [place], [place] [address]   MR Lumbar Spine W Wo Contrast  Result Date: 01/31/2023 CLINICAL DATA:  Low back pain with cauda equina syndrome suspected. Back surgery 2 days ago EXAM: MRI LUMBAR SPINE WITHOUT AND WITH CONTRAST TECHNIQUE: Multiplanar and multiecho pulse sequences of the lumbar spine were obtained without and with intravenous contrast. CONTRAST:  6.79mL GADAVIST GADOBUTROL 1 MMOL/ML IV SOLN COMPARISON:  02/25/2020 FINDINGS: Segmentation:  5 lumbar type vertebrae Alignment:  Interval L4-5 fusion. Vertebrae: Disc space T2 hyperintensity at L1-2 to L3-4 and L5-S1, without fracture or erosion - likely degenerative disc fissures containing fluid. No acute fracture. Conus medullaris and cauda equina: Conus extends to the T12-L1 level. Conus appears normal. There is cauda equina compres that sion is particularly severe at L3 and L4. Best seen on postcontrast images there is patchy nonenhancing areas in the epidural space of L2 and L4 which could be gas or blood products. Presumed fluid collection within the laminectomy defect posteriorly at L4. The cauda equina is diffusely tapered and there is anterior displacement of the conus when compared to preoperative study, shape suggesting simple subdural fluid throughout the upper lumbar canal. Paraspinal and other soft tissues: Changes in the posterior soft tissues consistent with recent surgery. Distended urinary bladder Disc levels: L1-L2: Disc narrowing and bulging with endplate ridging. No change from preoperative study. The cauda equina does appear anteriorly displaced by subdural  effusion. L2-L3: Disc narrowing and bulging with endplate and facet spurring. Some posterior epidural blood clot or gas epidural fluid. Moderate narrowing of the thecal sac with cauda equina crowding. L3-L4: Disc narrowing and bulging with endplate and facet spurring. Foraminal impingement greater on the right. Thecal sac compression by epidural fat, degeneration, and some epidural gas. L4-L5: Postoperative level. Advanced thecal sac stenosis with posterior T2 hyperintensity presumably reflecting fluid. There is some residual midline disc contributing to thecal sac stenosis. Improved foraminal patency  specially on the left. L5-S1:Disc narrowing and endplate degeneration with endplate and facet spurring. The canal and foramina are patent. IMPRESSION: 1. Recent L4-5 PLIF. Severe thecal sac stenosis at this level from posterior epidural fluid and some residual disc in the midline. Epidural gas or blood products extend cranially and at L3-4 where there is also severe thecal sac stenosis from degeneration, epidural fat, and epidural fluid. 2. L1-2 and L2-3 there is new cauda equina crowding with shape suggesting subdural effusion. Contributing chronic degeneration. 3. Distended urinary bladder. Electronically Signed   By: Tiburcio Pea M.D.   On: 01/31/2023 04:43    Pertinent items noted in HPI and remainder of comprehensive ROS otherwise negative.  Blood pressure (!) 160/61, pulse 98, temperature 99 F (37.2 C), temperature source Oral, resp. rate 19, height 5\' 6"  (1.676 m), weight 65.3 kg, SpO2 97%.  Patient is somnolent.  She has received medications for pain.  I discussed the situation with both her and her husband.  On examination she awakens reasonably easily.  She is oriented x 4.  Her speech is fluent.  Her judgment and insight appeared reasonably intact.  Cranial nerve function normal bilateral.  Motor examination her upper extremities normal.  Motor examination of her lower extremities reveals  moderately severe weakness of both quadriceps muscle groups grading at 4 - L5.  She has 4-/5 dorsiflexion strength bilaterally.  She has 4/5 plantarflexion strength bilaterally.  She has decreased sensation from L4 distally.  She has diminished sacral sensation.  Her wound is clean and dry.  Her chest and abdomen are benign.  Extremities are free of major deformity. Assessment/Plan Status post L4-5 decompression and fusion with acute neurological worsening and increased pain.  MRI scan suggestive of postoperative epidural hematoma with significant cauda equina compression.  Discussed situation with the patient and her husband.  Recommended we moved forward with emergent reexploration of the lumbar wound and evacuation of hematoma.  I discussed the risks involved with surgery.  Patient and her husband agree to proceed.  Sherilyn Cooter A Jamol Ginyard 01/31/2023, 6:20 AM

## 2023-01-31 NOTE — Transfer of Care (Signed)
Immediate Anesthesia Transfer of Care Note  Patient: Cheryl Pearson  Procedure(s) Performed: LUMBAR WOUND RE-EXPLORATION AND EVACUATION OF HEMATOMA (Back)  Patient Location: PACU  Anesthesia Type:General  Level of Consciousness: drowsy and patient cooperative  Airway & Oxygen Therapy: Patient Spontanous Breathing and Patient connected to nasal cannula oxygen  Post-op Assessment: Report given to RN, Post -op Vital signs reviewed and stable, and Patient moving all extremities  Post vital signs: Reviewed and stable  Last Vitals:  Vitals Value Taken Time  BP 158/75 01/31/23 0850  Temp    Pulse 82 01/31/23 0852  Resp 16 01/31/23 0852  SpO2 100 % 01/31/23 0852  Vitals shown include unfiled device data.  Last Pain:  Vitals:   01/31/23 0720  TempSrc:   PainSc: 0-No pain         Complications: No notable events documented.

## 2023-01-31 NOTE — ED Notes (Signed)
Pt to OR.

## 2023-01-31 NOTE — Brief Op Note (Signed)
01/31/2023  8:32 AM  PATIENT:  Waldo Laine  82 y.o. female  PRE-OPERATIVE DIAGNOSIS:  Post-op Epidural Hematoma  POST-OPERATIVE DIAGNOSIS:  Post-op Epidural Hematoma  PROCEDURE:  Procedure(s): LUMBAR WOUND RE-EXPLORATION AND EVACUATION OF HEMATOMA (N/A)  SURGEON:  Surgeons and Role:    Julio Sicks, MD - Primary  PHYSICIAN ASSISTANT:   ASSISTANTSMarland Mcalpine   ANESTHESIA:   general  EBL:  Minimal   BLOOD ADMINISTERED:none  DRAINS: (med) Hemovact drain(s) in the epidural space with  Suction Open   LOCAL MEDICATIONS USED:  NONE  SPECIMEN:  No Specimen  DISPOSITION OF SPECIMEN:  N/A  COUNTS:  YES  TOURNIQUET:  * No tourniquets in log *  DICTATION: .Dragon Dictation  PLAN OF CARE: Admit to inpatient   PATIENT DISPOSITION:  PACU - hemodynamically stable.   Delay start of Pharmacological VTE agent (>24hrs) due to surgical blood loss or risk of bleeding: yes

## 2023-01-31 NOTE — Op Note (Signed)
Date of procedure: 01/31/2023  Date of dictation: Same  Service: Neurosurgery  Preoperative diagnosis: Cauda equina syndrome secondary to postoperative epidural hematoma, L4-5  Postoperative diagnosis: Same  Procedure Name: Reexploration of lumbar wound with evacuation of epidural hematoma  Surgeon:Ellsie Violette A.Mel Langan, M.D.  Asst. Surgeon: Doran Durand, NP  Anesthesia: General  Indication: 82 year old female recently status post L4-5 decompression and fusion surgery.  Patient initially did well and was discharged home.  She had significant worsening over the past 24 hours with increased back pain, bilateral lower extremity numbness and some progressive weakness with urinary retention.  Workup has included an MRI scan of her lumbar spine which demonstrates a postoperative fluid collection with severe compression of the thecal sac.  Patient presents now for lumbar wound reexploration and evacuation of presumed epidural hematoma.  Operative note: After induction of anesthesia, patient positioned prone onto a Wilson frame and appropriately padded.  Lumbar region prepped and draped sterilely.  Lumbar wound incision was reopened using scalpel and scissors.  This carried down sharply through the lumbodorsal fascia.  A great amount of mixed density fluid under pressure was encountered which was evacuated easily with suction.  Dissection was carried down to the level of the thecal sac.  The thecal sac appeared intact.  I explored above and below the laminectomy defect and found no evidence of residual hematoma or compression.  I found no evidence of dural injury or obvious CSF leak.  Had anesthesia perform of the Salva maneuver and this again demonstrated no evidence of CSF leakage.  I irrigated the wound thoroughly.  Vancomycin powder was placed the deep wound space as well as a medium Hemovac drain.  Wound was then closed in layers with Vicryl sutures.  Steri-Strips and sterile dressing were applied.  No apparent  complications.  Patient tolerated the procedure well and she returns to the recovery room postop.

## 2023-02-01 ENCOUNTER — Encounter (HOSPITAL_COMMUNITY): Payer: Self-pay | Admitting: Neurosurgery

## 2023-02-01 NOTE — Progress Notes (Signed)
  NEUROSURGERY PROGRESS NOTE   No issues overnight. Pt with mild back soreness. Able to ambulate with PT today   EXAM:  BP (!) 125/48   Pulse 86   Temp 98 F (36.7 C)   Resp 16   Ht 5\' 6"  (1.676 m)   Wt 65.3 kg   SpO2 97%   BMI 23.24 kg/m   Awake, alert, oriented  Speech fluent, appropriate  CN grossly intact  5/5 BUE/BLE  Hemovac in place, serosanguinous drainage.  IMPRESSION:  82 y.o. female POD#1 evacuation of postop fluid collection after L4-5 PLIF 4 days ago. No further back pain or leg weakness.  PLAN: - Cont to mobilize as tolerated - Will plan on d/c drain tomorrow, may consider d/c home after that   Lisbeth Renshaw, MD [facility]

## 2023-02-01 NOTE — Evaluation (Signed)
Physical Therapy Evaluation Patient Details Name: Cheryl Pearson MRN: [id number] DOB: 09-15-1940 Today's Date: 02/01/2023  History of Present Illness  Pt presents after Status post L4-5 decompression and fusion with acute neurological worsening and increased pain.  MRI scan suggestive of postoperative epidural hematoma with significant cauda equina compression; to OR on 10/6 for LUMBAR WOUND RE-EXPLORATION AND EVACUATION OF HEMATOMA; PMH significant for Macular degeneration, HTN, hypothyroidism, bone spur on L foot, thoracic fusion on 2019.  Clinical Impression   Pt admitted with above diagnosis. Lives at home with husband, in a single-level home with 3-5 steps to enter; Prior to previous very recent admission for initial back surgery, pt was managing independently; Presents to PT with postop pain, weakness;  Today, needed min assist to stand and walk in-room distance; Good support at home,a dn good potential to recover and retrun to modified independence; Pt currently with functional limitations due to the deficits listed below (see PT Problem List). Pt will benefit from skilled PT to increase their independence and safety with mobility to allow discharge to the venue listed below.           If plan is discharge home, recommend the following: A little help with walking and/or transfers;A little help with bathing/dressing/bathroom;Assistance with cooking/housework;Assist for transportation;Help with stairs or ramp for entrance   Can travel by private vehicle        Equipment Recommendations Rolling walker (2 wheels)  Recommendations for Other Services  OT consult    Functional Status Assessment Patient has had a recent decline in their functional status and demonstrates the ability to make significant improvements in function in a reasonable and predictable amount of time.     Precautions / Restrictions Precautions Precautions: Fall;Back Precaution Booklet Issued: No Precaution Comments:  reveiwed precautions, drain Required Braces or Orthoses: Spinal Brace Spinal Brace: Lumbar corset;Applied in sitting position Restrictions Weight Bearing Restrictions: No      Mobility  Bed Mobility Overal bed mobility: Needs Assistance Bed Mobility: Rolling, Sidelying to Sit, Sit to Sidelying Rolling: Min assist Sidelying to sit: Min assist       General bed mobility comments: min assist and use of bed pad for rolling; slow moving, min assist to steady once up in sitting    Transfers Overall transfer level: Needs assistance Equipment used: Rolling walker (2 wheels) Transfers: Sit to/from Stand Sit to Stand: Min assist           General transfer comment: cueing for hand placement, min assist to fully power up and steady; stood from bed and fron 3in1 over commode    Ambulation/Gait Ambulation/Gait assistance: Min assist (husband with chair follow) Gait Distance (Feet): 15 Feet Assistive device: Rolling walker (2 wheels) Gait Pattern/deviations: Step-through pattern, Decreased stride length, Trunk flexed, Narrow base of support       General Gait Details: min assist to steady; slow steps  Stairs            Wheelchair Mobility     Tilt Bed    Modified Rankin (Stroke Patients Only)       Balance Overall balance assessment: Needs assistance Sitting-balance support: No upper extremity supported, Feet supported Sitting balance-Leahy Scale: Good     Standing balance support: Bilateral upper extremity supported, Single extremity supported, During functional activity Standing balance-Leahy Scale: Poor Standing balance comment: relies on RW  Pertinent Vitals/Pain Pain Assessment Faces Pain Scale: Hurts little more Pain Location: incision site Pain Descriptors / Indicators: Operative site guarding, Sore    Home Living Family/patient expects to be discharged to:: Private residence Living Arrangements:  Spouse/significant other Available Help at Discharge: Family;Available 24 hours/day Type of Home: House Home Access: Stairs to enter Entrance Stairs-Rails: Doctor, general practice of Steps: 3   Home Layout: One level Home Equipment: Rollator (4 wheels);Standard Walker;BSC/3in1;Shower seat      Prior Function Prior Level of Function : Needs assist             Mobility Comments: pt reports typically "wall walking" bc RW doens't fit well in home ADLs Comments: independent with ADLs     Extremity/Trunk Assessment   Upper Extremity Assessment Upper Extremity Assessment: Defer to OT evaluation    Lower Extremity Assessment Lower Extremity Assessment: Generalized weakness    Cervical / Trunk Assessment Cervical / Trunk Assessment: Back Surgery  Communication   Communication Communication: No apparent difficulties Cueing Techniques: Verbal cues  Cognition Arousal: Alert Behavior During Therapy: WFL for tasks assessed/performed Overall Cognitive Status: History of cognitive impairments - at baseline                                 General Comments: pt with decreased memory, but this is baseline.  Able to recall back precautions with minimal cueing        General Comments General comments (skin integrity, edema, etc.): Husband present and supportive    Exercises     Assessment/Plan    PT Assessment Patient needs continued PT services  PT Problem List Decreased strength;Decreased activity tolerance;Decreased balance;Decreased mobility;Decreased coordination;Decreased knowledge of use of DME;Decreased safety awareness;Decreased knowledge of precautions;Pain       PT Treatment Interventions DME instruction;Gait training;Stair training;Functional mobility training;Therapeutic activities;Therapeutic exercise;Balance training;Patient/family education    PT Goals (Current goals can be found in the Care Plan section)  Acute Rehab PT Goals Patient  Stated Goal: get walking again and go home PT Goal Formulation: With patient/family Time For Goal Achievement: 02/05/23 Potential to Achieve Goals: Good    Frequency Min 5X/week     Co-evaluation               AM-PAC PT "6 Clicks" Mobility  Outcome Measure Help needed turning from your back to your side while in a flat bed without using bedrails?: A Little Help needed moving from lying on your back to sitting on the side of a flat bed without using bedrails?: A Little Help needed moving to and from a bed to a chair (including a wheelchair)?: A Little Help needed standing up from a chair using your arms (e.g., wheelchair or bedside chair)?: A Little Help needed to walk in hospital room?: A Little Help needed climbing 3-5 steps with a railing? : A Lot 6 Click Score: 17    End of Session Equipment Utilized During Treatment: Gait belt;Back brace Activity Tolerance: Patient tolerated treatment well Patient left: in chair;with call bell/phone within reach;with chair alarm set;with family/visitor present Nurse Communication: Mobility status PT Visit Diagnosis: Unsteadiness on feet (R26.81);Pain Pain - part of body:  (back)    Time: 1610-9604 PT Time Calculation (min) (ACUTE ONLY): 32 min   Charges:   PT Evaluation $PT Eval Moderate Complexity: 1 Mod PT Treatments $Gait Training: 8-22 mins PT General Charges $$ ACUTE PT VISIT: 1 Visit  Van Clines, PT  Acute Rehabilitation Services Office [phone] Secure Chat welcomed   Levi Aland 02/01/2023, 1:11 PM

## 2023-02-01 NOTE — Evaluation (Signed)
Occupational Therapy Evaluation Patient Details Name: Cheryl Pearson MRN: [id number] DOB: 09/07/1940 Today's Date: 02/01/2023   History of Present Illness Pt presents after Status post L4-5 decompression and fusion with acute neurological worsening and increased pain.  MRI scan suggestive of postoperative epidural hematoma with significant cauda equina compression; to OR on 10/6 for LUMBAR WOUND RE-EXPLORATION AND EVACUATION OF HEMATOMA; PMH significant for Macular degeneration, HTN, hypothyroidism, bone spur on L foot, thoracic fusion on 2019.   Clinical Impression   PTA admitted for above and presents with problem list below, including pain, decreased activity tolerance, impaired balance and back precautions.  She was educated on back precautions, brace mgmt, ADL compensatory techniques and recommendations.  Patient with fair recall of back precautions but minimal cueing required.  She requires min assist for transfers and mobility using RW in room, up to min assist for ADLs.  She will have good support from spouse at dc. Will continue to follow and recommend home health OT at dc to optimize independence, safety and return to PLOF.       If plan is discharge home, recommend the following: A little help with bathing/dressing/bathroom;A little help with walking and/or transfers;Assistance with cooking/housework;Help with stairs or ramp for entrance;Assist for transportation;Direct supervision/assist for financial management    Functional Status Assessment  Patient has had a recent decline in their functional status and demonstrates the ability to make significant improvements in function in a reasonable and predictable amount of time.  Equipment Recommendations  None recommended by OT    Recommendations for Other Services       Precautions / Restrictions Precautions Precautions: Fall;Back Precaution Booklet Issued: No Precaution Comments: reveiwed precautions, drain Required Braces or  Orthoses: Spinal Brace Spinal Brace: Lumbar corset;Applied in sitting position Restrictions Weight Bearing Restrictions: No      Mobility Bed Mobility               General bed mobility comments: OOB in recliner upon entry    Transfers Overall transfer level: Needs assistance Equipment used: Rolling walker (2 wheels) Transfers: Sit to/from Stand Sit to Stand: Min assist           General transfer comment: cueing for hand placement, min assist to fully power up and steady      Balance Overall balance assessment: Needs assistance Sitting-balance support: No upper extremity supported, Feet supported Sitting balance-Leahy Scale: Good     Standing balance support: Bilateral upper extremity supported, Single extremity supported, During functional activity Standing balance-Leahy Scale: Poor Standing balance comment: relies on RW                           ADL either performed or assessed with clinical judgement   ADL Overall ADL's : Needs assistance/impaired     Grooming: Set up;Sitting           Upper Body Dressing : Minimal assistance;Sitting Upper Body Dressing Details (indicate cue type and reason): brace mgmt Lower Body Dressing: Minimal assistance;Sit to/from stand Lower Body Dressing Details (indicate cue type and reason): figure 4 technique with increased time for socks Toilet Transfer: Minimal assistance;Ambulation;Rolling walker (2 wheels)           Functional mobility during ADLs: Minimal assistance;Rolling walker (2 wheels)       Vision Patient Visual Report: No change from baseline       Perception         Praxis  Pertinent Vitals/Pain Pain Assessment Pain Assessment: Faces Faces Pain Scale: Hurts little more Pain Location: incision site Pain Descriptors / Indicators: Operative site guarding, Sore Pain Intervention(s): Limited activity within patient's tolerance, Monitored during session, Repositioned      Extremity/Trunk Assessment Upper Extremity Assessment Upper Extremity Assessment: Overall WFL for tasks assessed   Lower Extremity Assessment Lower Extremity Assessment: Defer to PT evaluation   Cervical / Trunk Assessment Cervical / Trunk Assessment: Back Surgery   Communication Communication Communication: No apparent difficulties Cueing Techniques: Verbal cues   Cognition Arousal: Alert Behavior During Therapy: WFL for tasks assessed/performed Overall Cognitive Status: History of cognitive impairments - at baseline                                 General Comments: pt with decreased memory, but this is baseline.  Able to recall back precautions with minimal cueing     General Comments       Exercises     Shoulder Instructions      Home Living Family/patient expects to be discharged to:: Private residence Living Arrangements: Spouse/significant other Available Help at Discharge: Family;Available 24 hours/day Type of Home: House Home Access: Stairs to enter [employer] of Steps: 3 Entrance Stairs-Rails: Right;Left Home Layout: One level     Bathroom Shower/Tub: Producer, television/film/video: Standard Bathroom Accessibility: No   Home Equipment: Rollator (4 wheels);Standard Walker;BSC/3in1;Shower seat          Prior Functioning/Environment Prior Level of Function : Needs assist             Mobility Comments: pt reports typically "wall walking" bc RW doens't fit well in home ADLs Comments: independent with ADLs        OT Problem List: Decreased strength;Decreased activity tolerance;Decreased cognition;Pain;Decreased knowledge of precautions;Decreased knowledge of use of DME or AE;Impaired balance (sitting and/or standing)      OT Treatment/Interventions: Self-care/ADL training;Therapeutic exercise;DME and/or AE instruction;Therapeutic activities;Patient/family education;Balance training    OT Goals(Current goals can  be found in the care plan section) Acute Rehab OT Goals Patient Stated Goal: home OT Goal Formulation: With patient Time For Goal Achievement: 02/15/23 Potential to Achieve Goals: Good  OT Frequency: Min 1X/week    Co-evaluation              AM-PAC OT "6 Clicks" Daily Activity     Outcome Measure Help from another person eating meals?: None Help from another person taking care of personal grooming?: A Little Help from another person toileting, which includes using toliet, bedpan, or urinal?: A Little Help from another person bathing (including washing, rinsing, drying)?: A Little Help from another person to put on and taking off regular upper body clothing?: A Little Help from another person to put on and taking off regular lower body clothing?: A Little 6 Click Score: 19   End of Session Equipment Utilized During Treatment: Rolling walker (2 wheels);Back brace Nurse Communication: Mobility status  Activity Tolerance: Patient tolerated treatment well Patient left: in chair;with call bell/phone within reach;with chair alarm set;with family/visitor present  OT Visit Diagnosis: Unsteadiness on feet (R26.81);Other abnormalities of gait and mobility (R26.89);Pain Pain - part of body:  (back)                Time: 0981-1914 OT Time Calculation (min): 29 min Charges:  OT General Charges $OT Visit: 1 Visit OT Evaluation $OT Eval Moderate Complexity: 1 Mod OT Treatments $  Self Care/Home Management : 8-22 mins  Barry Brunner, OT Acute Rehabilitation Services Office [phone]   Chancy Milroy 02/01/2023, 12:52 PM

## 2023-02-01 NOTE — Plan of Care (Signed)
  Problem: Activity: Goal: Will remain free from falls Outcome: Progressing   Problem: Clinical Measurements: Goal: Postoperative complications will be avoided or minimized Outcome: Progressing   Problem: Pain Management: Goal: Pain level will decrease Outcome: Progressing

## 2023-02-02 NOTE — Progress Notes (Signed)
  NEUROSURGERY PROGRESS NOTE   No issues overnight. Pt with minimal back pain. Ambulating well with rolling walker.   EXAM:  BP (!) 159/56   Pulse 94   Temp 97.8 F (36.6 C) (Oral)   Resp 16   Ht 5\' 6"  (1.676 m)   Wt 65.3 kg   SpO2 99%   BMI 23.24 kg/m   Awake, alert, oriented  Speech fluent, appropriate  CN grossly intact  5/5 BUE/BLE  Hemovac in place, serosanguinous drainage, ~20cc overnight  IMPRESSION:  82 y.o. female POD#2 evacuation of postop fluid collection after L4-5 PLIF 5 days ago. No further back pain or leg weakness.  PLAN: - Cont to mobilize as tolerated - d/c drain today - Likely d/c home tomorrow   Lisbeth Renshaw, MD [facility]

## 2023-02-02 NOTE — Plan of Care (Signed)
  Problem: Activity: Goal: Ability to avoid complications of mobility impairment will improve Outcome: Progressing Goal: Ability to tolerate increased activity will improve Outcome: Progressing Goal: Will remain free from falls Outcome: Progressing   Problem: Bowel/Gastric: Goal: Gastrointestinal status for postoperative course will improve Outcome: Progressing   Problem: Clinical Measurements: Goal: Ability to maintain clinical measurements within normal limits will improve Outcome: Progressing Goal: Postoperative complications will be avoided or minimized Outcome: Progressing Goal: Diagnostic test results will improve Outcome: Progressing   Problem: Pain Management: Goal: Pain level will decrease Outcome: Progressing

## 2023-02-02 NOTE — Plan of Care (Signed)

## 2023-02-02 NOTE — Progress Notes (Signed)
Physical Therapy Treatment Patient Details Name: Cheryl Pearson MRN: [id number] DOB: 1940-09-22 Today's Date: 02/02/2023   History of Present Illness Pt presents after Status post L4-5 decompression and fusion with acute neurological worsening and increased pain.  MRI scan suggestive of postoperative epidural hematoma with significant cauda equina compression; to OR on 10/6 for LUMBAR WOUND RE-EXPLORATION AND EVACUATION OF HEMATOMA; PMH significant for Macular degeneration, HTN, hypothyroidism, bone spur on L foot, thoracic fusion on 2019.    PT Comments  Continuing work on functional mobility and activity tolerance;  session focused on gait training, and stair training, which pt and husband performed quite well; on track for dc home tomorrow    If plan is discharge home, recommend the following: A little help with walking and/or transfers;A little help with bathing/dressing/bathroom;Assistance with cooking/housework;Assist for transportation;Help with stairs or ramp for entrance   Can travel by private vehicle        Equipment Recommendations  Rolling walker (2 wheels)    Recommendations for Other Services       Precautions / Restrictions Precautions Precautions: Fall;Back Precaution Comments: reveiwed precautions, drain Required Braces or Orthoses: Spinal Brace Spinal Brace: Lumbar corset;Applied in sitting position Restrictions Weight Bearing Restrictions: No     Mobility  Bed Mobility                    Transfers                        Ambulation/Gait Ambulation/Gait assistance: Contact guard assist (with husband providing appropriate assist) Gait Distance (Feet): 120 Feet Assistive device: Rolling walker (2 wheels) Gait Pattern/deviations: Step-through pattern, Decreased step length - right, Decreased step length - left, Decreased stride length, Trunk flexed       General Gait Details: cues for RW to be kept closer during amb, which will help for  more upright posture   Stairs Stairs: Yes Stairs assistance: Contact guard assist Stair Management: One rail Left, Step to pattern, Forwards Number of Stairs: 4 General stair comments: VC's for sequencing and general safety; husband providing appropriate assist   Wheelchair Mobility     Tilt Bed    Modified Rankin (Stroke Patients Only)       Balance     Sitting balance-Leahy Scale: Good       Standing balance-Leahy Scale: Poor Standing balance comment: relies on RW                            Cognition Arousal: Alert Behavior During Therapy: WFL for tasks assessed/performed Overall Cognitive Status: History of cognitive impairments - at baseline                                 General Comments: pt with decreased memory, but this is baseline.  Able to recall back precautions with minimal cueing        Exercises      General Comments General comments (skin integrity, edema, etc.): pt's RN to pull drain after her walk      Pertinent Vitals/Pain Pain Assessment Pain Assessment: Faces Faces Pain Scale: Hurts a little bit Pain Location: incision site Pain Descriptors / Indicators: Operative site guarding, Sore Pain Intervention(s): Limited activity within patient's tolerance    Home Living  Prior Function            PT Goals (current goals can now be found in the care plan section) Acute Rehab PT Goals Patient Stated Goal: get walking again and go home PT Goal Formulation: With patient/family Time For Goal Achievement: 02/05/23 Potential to Achieve Goals: Good Progress towards PT goals: Progressing toward goals    Frequency    Min 5X/week      PT Plan      Co-evaluation              AM-PAC PT "6 Clicks" Mobility   Outcome Measure  Help needed turning from your back to your side while in a flat bed without using bedrails?: A Little Help needed moving from lying on your  back to sitting on the side of a flat bed without using bedrails?: A Little Help needed moving to and from a bed to a chair (including a wheelchair)?: A Little Help needed standing up from a chair using your arms (e.g., wheelchair or bedside chair)?: A Little Help needed to walk in hospital room?: A Little Help needed climbing 3-5 steps with a railing? : A Lot 6 Click Score: 17    End of Session Equipment Utilized During Treatment: Gait belt;Back brace Activity Tolerance: Patient tolerated treatment well Patient left: Other (comment) (continueing her walk with her husband) Nurse Communication: Mobility status PT Visit Diagnosis: Unsteadiness on feet (R26.81);Pain Pain - part of body:  (back)     Time: 1210-1230 PT Time Calculation (min) (ACUTE ONLY): 20 min  Charges:    $Gait Training: 8-22 mins PT General Charges $$ ACUTE PT VISIT: 1 Visit                     Van Clines, PT  Acute Rehabilitation Services Office [phone] Secure Chat welcomed    Cheryl Pearson 02/02/2023, 4:44 PM

## 2023-02-03 NOTE — Progress Notes (Signed)
Occupational Therapy Treatment Patient Details Name: Cheryl Pearson MRN: [id number] DOB: 05/05/1940 Today's Date: 02/03/2023   History of present illness Pt presents after Status post L4-5 decompression and fusion with acute neurological worsening and increased pain.  MRI scan suggestive of postoperative epidural hematoma with significant cauda equina compression; to OR on 10/6 for LUMBAR WOUND RE-EXPLORATION AND EVACUATION OF HEMATOMA; PMH significant for Macular degeneration, HTN, hypothyroidism, bone spur on L foot, thoracic fusion on 2019.   OT comments  Patient seated in recliner, reports plan to dc home today.  Pt donning back brace with supervision, completing LB dressing with min guard and simulated walk in shower transfers with min guard.  Pt/spouse thankful for education on technique for shower transfer. Pt recalls back precautions with min cueing. Spouse very supportive. Will follow acutely.       If plan is discharge home, recommend the following:  A little help with bathing/dressing/bathroom;A little help with walking and/or transfers;Assistance with cooking/housework;Help with stairs or ramp for entrance;Assist for transportation;Direct supervision/assist for financial management   Equipment Recommendations  None recommended by OT    Recommendations for Other Services      Precautions / Restrictions Precautions Precautions: Fall;Back Precaution Booklet Issued: No Precaution Comments: reveiwed precautions, drain Required Braces or Orthoses: Spinal Brace Spinal Brace: Lumbar corset;Applied in sitting position Restrictions Weight Bearing Restrictions: No       Mobility Bed Mobility               General bed mobility comments: OOB in chair    Transfers Overall transfer level: Needs assistance Equipment used: Rolling walker (2 wheels) Transfers: Sit to/from Stand Sit to Stand: Contact guard assist           General transfer comment: increased time to power  up but no physical assistance required     Balance Overall balance assessment: Needs assistance Sitting-balance support: No upper extremity supported, Feet supported Sitting balance-Leahy Scale: Good     Standing balance support: Bilateral upper extremity supported, During functional activity Standing balance-Leahy Scale: Poor Standing balance comment: relies on RW                           ADL either performed or assessed with clinical judgement   ADL Overall ADL's : Needs assistance/impaired     Grooming: Set up;Sitting           Upper Body Dressing : Supervision/safety;Sitting   Lower Body Dressing: Contact guard assist;Sit to/from stand Lower Body Dressing Details (indicate cue type and reason): figure 4 technique for socks Toilet Transfer: Contact guard assist;Ambulation;Rolling walker (2 wheels)       Tub/ Shower Transfer: Contact guard assist;Walk-in shower;Ambulation;Rolling walker (2 wheels);Shower seat   Functional mobility during ADLs: Contact guard assist;Rolling walker (2 wheels)      Extremity/Trunk Assessment              Vision       Perception     Praxis      Cognition Arousal: Alert Behavior During Therapy: WFL for tasks assessed/performed Overall Cognitive Status: History of cognitive impairments - at baseline                                 General Comments: pt with decreased memory, but this is baseline.  Able to recall back precautions with minimal cueing        Exercises  Shoulder Instructions       General Comments      Pertinent Vitals/ Pain       Pain Assessment Pain Assessment: Faces Faces Pain Scale: Hurts a little bit Pain Location: incision site Pain Descriptors / Indicators: Operative site guarding, Sore Pain Intervention(s): Limited activity within patient's tolerance, Monitored during session, Repositioned  Home Living                                           Prior Functioning/Environment              Frequency  Min 1X/week        Progress Toward Goals  OT Goals(current goals can now be found in the care plan section)  Progress towards OT goals: Progressing toward goals  Acute Rehab OT Goals Patient Stated Goal: home OT Goal Formulation: With patient Time For Goal Achievement: 02/15/23 Potential to Achieve Goals: Good  Plan      Co-evaluation                 AM-PAC OT "6 Clicks" Daily Activity     Outcome Measure   Help from another person eating meals?: None Help from another person taking care of personal grooming?: A Little Help from another person toileting, which includes using toliet, bedpan, or urinal?: A Little Help from another person bathing (including washing, rinsing, drying)?: A Little Help from another person to put on and taking off regular upper body clothing?: A Little Help from another person to put on and taking off regular lower body clothing?: A Little 6 Click Score: 19    End of Session Equipment Utilized During Treatment: Rolling walker (2 wheels);Back brace  OT Visit Diagnosis: Unsteadiness on feet (R26.81);Other abnormalities of gait and mobility (R26.89);Pain Pain - part of body:  (back)   Activity Tolerance Patient tolerated treatment well   Patient Left in chair;with call bell/phone within reach;with family/visitor present   Nurse Communication Mobility status        Time: 4259-5638 OT Time Calculation (min): 15 min  Charges: OT General Charges $OT Visit: 1 Visit OT Treatments $Self Care/Home Management : 8-22 mins  Barry Brunner, OT Acute Rehabilitation Services Office [phone]   Chancy Milroy 02/03/2023, 12:40 PM

## 2023-02-03 NOTE — TOC Transition Note (Signed)
Transition of Care [facility]) - CM/SW Discharge Note   Patient Details  Name: Cheryl Pearson MRN: [id number] Date of Birth: Apr 06, 1941  Transition of Care [facility]) CM/SW Contact:  Epifanio Lesches, RN Phone Number: 02/03/2023, 10:31 AM   Clinical Narrative:    Patient will DC to: home Anticipated DC date: 02/03/2023 Family notified: yes Transport by: car   Per MD patient ready for DC today. RN, patient, patient's husband aware of DC.   Post hospital f/u noted on AVS. Pt declined therapy recommendation for home health PT. Pt without DME needs or RX med concern. Husband to provide transportation to home.  RNCM will sign off for now as intervention is no longer needed. Please consult [place] again if new needs arise.    Final next level of care: Home/Self Care Barriers to Discharge: No Barriers Identified   Patient Goals and CMS Choice      Discharge Placement                         Discharge Plan and Services Additional resources added to the After Visit Summary for                                       Social Determinants of Health (SDOH) Interventions SDOH Screenings   Food Insecurity: No Food Insecurity (01/31/2023)  Housing: Low Risk  (01/31/2023)  Transportation Needs: No Transportation Needs (01/31/2023)  Utilities: Not At Risk (01/31/2023)  Depression (PHQ2-9): Low Risk  (05/20/2018)  Tobacco Use: Low Risk  (01/31/2023)     Readmission Risk Interventions     No data to display

## 2023-02-03 NOTE — Discharge Summary (Signed)
Physician Discharge Summary  Patient ID: KALAN RANG MRN: [id number] DOB/AGE: Oct 23, 1940 82 y.o.  Admit date: 01/30/2023 Discharge date: 02/03/2023  Admission Diagnoses:  Cauda equina syndrome  Discharge Diagnoses:  Same Principal Problem:   Cauda equina syndrome [facility])   Discharged Condition: Stable  Hospital Course:  Cheryl Pearson is a 82 y.o. female who underwent uncomplicated L4-5 decompression and fusion and went home.  She presented back to the hospital the day after her discharge with worsening lower extremity weakness and bladder dysfunction.  Imaging revealed a large fluid collection in the operative space.  She was taken urgently for exploration and evacuation.  She had significant improvement in both back pain and leg weakness postoperatively.  Hemovac drain was discontinued 2 days after surgery.  She was working with physical and Occupational Therapy and ambulating well with minimal assistance, voiding normally.  She had minimal back pain controlled with oral Tylenol.  She requested discharge home on hospital day 4.  Treatments: Surgery -exploration and evacuation of postoperative fluid collection  Discharge Exam: Blood pressure 139/67, pulse 94, temperature 97.8 F (36.6 C), temperature source Oral, resp. rate 17, height 5\' 6"  (1.676 m), weight 65.3 kg, SpO2 97%. Awake, alert, oriented Speech fluent, appropriate CN grossly intact 5/5 BUE/BLE Wound c/d/i  Disposition: Discharge disposition: 01-Home or Self Care       Discharge Instructions     Call MD for:  redness, tenderness, or signs of infection (pain, swelling, redness, odor or green/yellow discharge around incision site)   Complete by: As directed    Call MD for:  temperature >100.4   Complete by: As directed    Diet - low sodium heart healthy   Complete by: As directed    Discharge instructions   Complete by: As directed    Walk at home as much as possible, at least 4 times / day   Increase activity  slowly   Complete by: As directed    Lifting restrictions   Complete by: As directed    No lifting > 10 lbs   May shower / Bathe   Complete by: As directed    48 hours after surgery   May walk up steps   Complete by: As directed    No dressing needed   Complete by: As directed    Other Restrictions   Complete by: As directed    No bending/twisting at waist      Allergies as of 02/03/2023       Reactions   Yellow Jacket Venom [bee Venom] Anaphylaxis   Crestor [rosuvastatin] Other (See Comments)   aches   Lipitor [atorvastatin] Other (See Comments)   Joint pain.   Statins Other (See Comments)   aches   Welchol [colesevelam Hcl] Other (See Comments)   aches   Zocor [simvastatin] Other (See Comments)   aches        Medication List     STOP taking these medications    Advil 200 MG tablet Generic drug: ibuprofen       TAKE these medications    acetaminophen 325 MG tablet Commonly known as: TYLENOL Take 2 tablets (650 mg total) by mouth every 4 (four) hours as needed for mild pain ((score 1 to 3) or temp > 100.5).   amLODipine 2.5 MG tablet Commonly known as: NORVASC Take 1 tablet (2.5 mg total) by mouth daily. What changed: how much to take   DRY EYE RELIEF DROPS OP Place 1 drop into both eyes daily  as needed (Dry eye).   ezetimibe 10 MG tablet Commonly known as: ZETIA Take 1 tablet (10 mg total) by mouth daily.   methocarbamol 500 MG tablet Commonly known as: ROBAXIN Take 1 tablet (500 mg total) by mouth every 6 (six) hours as needed for muscle spasms.   oxyCODONE 5 MG immediate release tablet Commonly known as: Oxy IR/ROXICODONE Take 1 tablet (5 mg total) by mouth every 3 (three) hours as needed for moderate pain ((score 4 to 6)).   PRESERVISION AREDS 2 PO Take 2 capsules by mouth daily. Chewable   tiZANidine 2 MG tablet Commonly known as: ZANAFLEX Take 2 mg by mouth at bedtime as needed for muscle spasms.               Discharge Care  Instructions  (From admission, onward)           Start     Ordered   02/03/23 0000  No dressing needed        02/03/23 1002            Follow-up Information     Lisbeth Renshaw, MD Follow up.   Specialty: Neurosurgery Contact information: [address] [place] [address] [phone]                 Signed: Jackelyn Hoehn 02/03/2023, 10:02 AM

## 2023-02-03 NOTE — Plan of Care (Signed)
Problem: Education: Goal: Ability to verbalize activity precautions or restrictions will improve 02/03/2023 0346 by Dmario Russom, [place] Nickel, RN Outcome: Progressing 02/03/2023 0346 by Drey Shaff, [place] Nickel, RN Outcome: Progressing Goal: Knowledge of the prescribed therapeutic regimen will improve 02/03/2023 0346 by Pranish Akhavan, [place] Nickel, RN Outcome: Progressing 02/03/2023 0346 by Yanna Leaks, [place] Nickel, RN Outcome: Progressing Goal: Understanding of discharge needs will improve 02/03/2023 0346 by Pandora Mccrackin, [place] Nickel, RN Outcome: Progressing 02/03/2023 0346 by Fairley Copher, [place] Nickel, RN Outcome: Progressing   Problem: Activity: Goal: Ability to avoid complications of mobility impairment will improve 02/03/2023 0346 by Freddi Schrager, [place] Nickel, RN Outcome: Progressing 02/03/2023 0346 by Yehia Mcbain, [place] Nickel, RN Outcome: Progressing Goal: Ability to tolerate increased activity will improve 02/03/2023 0346 by Cereniti Curb, [place] Nickel, RN Outcome: Progressing 02/03/2023 0346 by Artur Winningham, [place] Nickel, RN Outcome: Progressing Goal: Will remain free from falls 02/03/2023 0346 by Christalynn Boise, [place] Nickel, RN Outcome: Progressing 02/03/2023 0346 by Dom Haverland, [place] Nickel, RN Outcome: Progressing   Problem: Bowel/Gastric: Goal: Gastrointestinal status for postoperative course will improve 02/03/2023 0346 by Lemmie Vanlanen, [place] Nickel, RN Outcome: Progressing 02/03/2023 0346 by Isobelle Tuckett, [place] Nickel, RN Outcome: Progressing   Problem: Clinical Measurements: Goal: Ability to maintain clinical measurements within normal limits will improve 02/03/2023 0346 by Gabbie Marzo, [place] Nickel, RN Outcome: Progressing 02/03/2023 0346 by Adelaine Roppolo, [place] Nickel, RN Outcome: Progressing Goal: Postoperative complications will be avoided or minimized 02/03/2023 0346 by Urania Pearlman, [place] Nickel, RN Outcome: Progressing 02/03/2023 0346 by Lewellyn Fultz, [place] Nickel, RN Outcome: Progressing Goal: Diagnostic test results will improve 02/03/2023 0346 by Cutler Sunday, [place] Nickel, RN Outcome: Progressing 02/03/2023 0346 by Leyli Kevorkian, [place] Nickel, RN Outcome: Progressing   Problem: Pain Management: Goal: Pain level will decrease 02/03/2023 0346 by Lakina Mcintire, [place] Nickel, RN Outcome: Progressing 02/03/2023 0346 by Meaghan Whistler, [place] Nickel, RN Outcome: Progressing   Problem: Skin Integrity: Goal: Will show signs of wound healing 02/03/2023 0346 by Carmino Ocain, [place] Nickel, RN Outcome: Progressing 02/03/2023 0346 by Kellie Murrill, [place] Nickel, RN Outcome: Progressing   Problem: Health Behavior/Discharge Planning: Goal: Identification of resources available to assist in meeting health care needs will improve 02/03/2023 0346 by Jonesha Tsuchiya, [place] Nickel, RN Outcome: Progressing 02/03/2023 0346 by Coben Godshall, [place] Nickel, RN Outcome: Progressing   Problem: Bladder/Genitourinary: Goal: Urinary functional status for postoperative course will improve 02/03/2023 0346 by Donivin Wirt, [place] Nickel, RN Outcome: Progressing 02/03/2023 0346 by Thales Knipple, [place] Nickel, RN Outcome: Progressing   Problem: Education: Goal: Knowledge of General Education information will improve Description: Including pain rating scale, medication(s)/side effects and non-pharmacologic comfort measures 02/03/2023 0346 by Rylei Masella, [place] Nickel, RN Outcome: Progressing 02/03/2023 0346 by Aylah Yeary, [place] Nickel, RN Outcome: Progressing   Problem: Health Behavior/Discharge Planning: Goal: Ability to manage health-related needs will improve 02/03/2023 0346 by Jessieca Rhem, [place] Nickel, RN Outcome: Progressing 02/03/2023 0346 by Haden Suder, [place] Nickel, RN Outcome: Progressing   Problem: Clinical Measurements: Goal: Ability to maintain clinical measurements within normal limits will improve 02/03/2023 0346 by Darthy Manganelli, [place] Nickel, RN Outcome: Progressing 02/03/2023 0346 by Tandy Grawe, [place] Nickel, RN Outcome: Progressing Goal: Will remain free from infection 02/03/2023 0346 by Alfonse Garringer, [place] Nickel, RN Outcome: Progressing 02/03/2023 0346 by Mardene Lessig, [place] Nickel, RN Outcome: Progressing Goal: Diagnostic test results will improve 02/03/2023 0346 by Mairin Lindsley, [place] Nickel,  RN Outcome: Progressing 02/03/2023 0346 by Shaniyah Wix, [place] Nickel, RN Outcome: Progressing Goal: Respiratory complications will improve 02/03/2023 0346 by Coreen Shippee, [place] Nickel, RN Outcome: Progressing 02/03/2023 0346 by Abagael Kramm, [place] Nickel, RN Outcome: Progressing Goal: Cardiovascular complication will be avoided 02/03/2023 0346 by Jeiden Daughtridge, [place] Nickel, RN Outcome: Progressing  02/03/2023 0346 by Jaleena Viviani, [place] Nickel, RN Outcome: Progressing   Problem: Activity: Goal: Risk for activity intolerance will decrease 02/03/2023 0346 by Deshawn Skelley, [place] Nickel, RN Outcome: Progressing 02/03/2023 0346 by Cassian Torelli, [place] Nickel, RN Outcome: Progressing   Problem: Nutrition: Goal: Adequate nutrition will be maintained 02/03/2023 0346 by Aneisha Skyles, [place] Nickel, RN Outcome: Progressing 02/03/2023 0346 by Michala Deblanc, [place] Nickel, RN Outcome: Progressing   Problem: Coping: Goal: Level of anxiety will decrease 02/03/2023 0346 by Collette Pescador, [place] Nickel, RN Outcome: Progressing 02/03/2023 0346 by Conchita Truxillo, [place] Nickel, RN Outcome: Progressing   Problem: Elimination: Goal: Will not experience complications related to bowel motility 02/03/2023 0346 by Jizelle Conkey, [place] Nickel, RN Outcome: Progressing 02/03/2023 0346 by Stiven Kaspar, [place] Nickel, RN Outcome: Progressing Goal: Will not experience complications related to urinary retention 02/03/2023 0346 by Yerick Eggebrecht, [place] Nickel, RN Outcome: Progressing 02/03/2023 0346 by Kerisha Goughnour, [place] Nickel, RN Outcome: Progressing   Problem: Pain Managment: Goal: General experience of comfort will improve 02/03/2023 0346 by Yzabelle Calles, [place] Nickel, RN Outcome: Progressing 02/03/2023 0346 by Shawnette Augello, [place] Nickel, RN Outcome: Progressing   Problem: Safety: Goal: Ability to remain free from injury will improve 02/03/2023 0346 by Varetta Chavers, [place] Nickel, RN Outcome: Progressing 02/03/2023 0346 by Tyson Masin, [place] Nickel, RN Outcome: Progressing   Problem: Skin Integrity: Goal: Risk for impaired skin integrity will decrease 02/03/2023 0346 by Tymothy Cass, [place] Nickel,  RN Outcome: Progressing 02/03/2023 0346 by Levy Cedano, [place] Nickel, RN Outcome: Progressing

## 2023-02-03 NOTE — Progress Notes (Signed)
10/9 Patient discharged before the IMM Letter was given. Letter will be mailed to the address on file.

## 2023-02-03 NOTE — Progress Notes (Signed)
  NEUROSURGERY PROGRESS NOTE   No issues overnight. Pt feels well, wants to go home.  EXAM:  BP 139/67 (BP Location: Left Arm)   Pulse 94   Temp 97.8 F (36.6 C) (Oral)   Resp 17   Ht 5\' 6"  (1.676 m)   Wt 65.3 kg   SpO2 97%   BMI 23.24 kg/m   Awake, alert, oriented  Speech fluent, appropriate  CN grossly intact  5/5 BUE/BLE   IMPRESSION:  82 y.o. female #3 evacuation of postop fluid collection after L4-5 PLIF 6 days ago. No further back pain or leg weakness, doing well  PLAN: - d/c home today   Lisbeth Renshaw, MD [facility]

## 2023-02-03 NOTE — Progress Notes (Signed)
Discharge instructions given. Patient verbalized understanding and all questions were answered.  ?

## 2023-02-03 NOTE — Care Management Important Message (Signed)
Important Message  Patient Details  Name: Cheryl Pearson MRN: [id number] Date of Birth: Aug 26, 1940   Important Message Given:  No     Sherilyn Banker 02/03/2023, 4:20 PM

## 2023-02-18 DIAGNOSIS — M4316 Spondylolisthesis, lumbar region: Secondary | ICD-10-CM | POA: Diagnosis not present

## 2023-04-15 DIAGNOSIS — Z8719 Personal history of other diseases of the digestive system: Secondary | ICD-10-CM | POA: Diagnosis not present

## 2023-04-15 DIAGNOSIS — E559 Vitamin D deficiency, unspecified: Secondary | ICD-10-CM | POA: Diagnosis not present

## 2023-04-15 DIAGNOSIS — Z0000 Encounter for general adult medical examination without abnormal findings: Secondary | ICD-10-CM | POA: Diagnosis not present

## 2023-04-15 DIAGNOSIS — I10 Essential (primary) hypertension: Secondary | ICD-10-CM | POA: Diagnosis not present

## 2023-04-15 DIAGNOSIS — E7800 Pure hypercholesterolemia, unspecified: Secondary | ICD-10-CM | POA: Diagnosis not present

## 2023-04-15 DIAGNOSIS — Z79899 Other long term (current) drug therapy: Secondary | ICD-10-CM | POA: Diagnosis not present

## 2023-04-15 DIAGNOSIS — H3530 Unspecified macular degeneration: Secondary | ICD-10-CM | POA: Diagnosis not present

## 2023-04-15 DIAGNOSIS — M8589 Other specified disorders of bone density and structure, multiple sites: Secondary | ICD-10-CM | POA: Diagnosis not present

## 2023-04-30 DIAGNOSIS — H43813 Vitreous degeneration, bilateral: Secondary | ICD-10-CM | POA: Diagnosis not present

## 2023-04-30 DIAGNOSIS — Z961 Presence of intraocular lens: Secondary | ICD-10-CM | POA: Diagnosis not present

## 2023-04-30 DIAGNOSIS — H353132 Nonexudative age-related macular degeneration, bilateral, intermediate dry stage: Secondary | ICD-10-CM | POA: Diagnosis not present

## 2023-05-03 DIAGNOSIS — H353132 Nonexudative age-related macular degeneration, bilateral, intermediate dry stage: Secondary | ICD-10-CM | POA: Diagnosis not present

## 2023-05-05 DIAGNOSIS — M4316 Spondylolisthesis, lumbar region: Secondary | ICD-10-CM | POA: Diagnosis not present

## 2023-05-17 DIAGNOSIS — M25559 Pain in unspecified hip: Secondary | ICD-10-CM | POA: Diagnosis not present

## 2023-05-17 DIAGNOSIS — R102 Pelvic and perineal pain: Secondary | ICD-10-CM | POA: Diagnosis not present

## 2023-05-17 DIAGNOSIS — M545 Low back pain, unspecified: Secondary | ICD-10-CM | POA: Diagnosis not present

## 2023-05-19 DIAGNOSIS — R102 Pelvic and perineal pain: Secondary | ICD-10-CM | POA: Diagnosis not present

## 2023-05-19 DIAGNOSIS — M545 Low back pain, unspecified: Secondary | ICD-10-CM | POA: Diagnosis not present

## 2023-05-19 DIAGNOSIS — M25559 Pain in unspecified hip: Secondary | ICD-10-CM | POA: Diagnosis not present

## 2023-05-31 DIAGNOSIS — R102 Pelvic and perineal pain: Secondary | ICD-10-CM | POA: Diagnosis not present

## 2023-05-31 DIAGNOSIS — M25559 Pain in unspecified hip: Secondary | ICD-10-CM | POA: Diagnosis not present

## 2023-05-31 DIAGNOSIS — M545 Low back pain, unspecified: Secondary | ICD-10-CM | POA: Diagnosis not present

## 2023-06-03 DIAGNOSIS — R102 Pelvic and perineal pain: Secondary | ICD-10-CM | POA: Diagnosis not present

## 2023-06-03 DIAGNOSIS — M545 Low back pain, unspecified: Secondary | ICD-10-CM | POA: Diagnosis not present

## 2023-06-03 DIAGNOSIS — M25559 Pain in unspecified hip: Secondary | ICD-10-CM | POA: Diagnosis not present

## 2023-06-07 DIAGNOSIS — M545 Low back pain, unspecified: Secondary | ICD-10-CM | POA: Diagnosis not present

## 2023-06-07 DIAGNOSIS — M25559 Pain in unspecified hip: Secondary | ICD-10-CM | POA: Diagnosis not present

## 2023-06-07 DIAGNOSIS — R102 Pelvic and perineal pain: Secondary | ICD-10-CM | POA: Diagnosis not present

## 2023-06-15 DIAGNOSIS — R102 Pelvic and perineal pain: Secondary | ICD-10-CM | POA: Diagnosis not present

## 2023-06-15 DIAGNOSIS — M545 Low back pain, unspecified: Secondary | ICD-10-CM | POA: Diagnosis not present

## 2023-06-15 DIAGNOSIS — M25559 Pain in unspecified hip: Secondary | ICD-10-CM | POA: Diagnosis not present

## 2023-06-17 DIAGNOSIS — M545 Low back pain, unspecified: Secondary | ICD-10-CM | POA: Diagnosis not present

## 2023-06-17 DIAGNOSIS — R102 Pelvic and perineal pain: Secondary | ICD-10-CM | POA: Diagnosis not present

## 2023-06-17 DIAGNOSIS — M25559 Pain in unspecified hip: Secondary | ICD-10-CM | POA: Diagnosis not present

## 2023-06-22 DIAGNOSIS — M545 Low back pain, unspecified: Secondary | ICD-10-CM | POA: Diagnosis not present

## 2023-06-22 DIAGNOSIS — R102 Pelvic and perineal pain: Secondary | ICD-10-CM | POA: Diagnosis not present

## 2023-06-22 DIAGNOSIS — M25559 Pain in unspecified hip: Secondary | ICD-10-CM | POA: Diagnosis not present

## 2023-06-30 DIAGNOSIS — R102 Pelvic and perineal pain: Secondary | ICD-10-CM | POA: Diagnosis not present

## 2023-06-30 DIAGNOSIS — M25559 Pain in unspecified hip: Secondary | ICD-10-CM | POA: Diagnosis not present

## 2023-06-30 DIAGNOSIS — M545 Low back pain, unspecified: Secondary | ICD-10-CM | POA: Diagnosis not present

## 2023-07-05 DIAGNOSIS — R102 Pelvic and perineal pain: Secondary | ICD-10-CM | POA: Diagnosis not present

## 2023-07-05 DIAGNOSIS — M25559 Pain in unspecified hip: Secondary | ICD-10-CM | POA: Diagnosis not present

## 2023-07-05 DIAGNOSIS — M545 Low back pain, unspecified: Secondary | ICD-10-CM | POA: Diagnosis not present

## 2023-07-08 DIAGNOSIS — M545 Low back pain, unspecified: Secondary | ICD-10-CM | POA: Diagnosis not present

## 2023-07-08 DIAGNOSIS — R102 Pelvic and perineal pain: Secondary | ICD-10-CM | POA: Diagnosis not present

## 2023-07-08 DIAGNOSIS — M25559 Pain in unspecified hip: Secondary | ICD-10-CM | POA: Diagnosis not present

## 2023-07-12 DIAGNOSIS — R102 Pelvic and perineal pain: Secondary | ICD-10-CM | POA: Diagnosis not present

## 2023-07-12 DIAGNOSIS — M545 Low back pain, unspecified: Secondary | ICD-10-CM | POA: Diagnosis not present

## 2023-07-12 DIAGNOSIS — M25559 Pain in unspecified hip: Secondary | ICD-10-CM | POA: Diagnosis not present

## 2023-07-21 DIAGNOSIS — R102 Pelvic and perineal pain: Secondary | ICD-10-CM | POA: Diagnosis not present

## 2023-07-21 DIAGNOSIS — M545 Low back pain, unspecified: Secondary | ICD-10-CM | POA: Diagnosis not present

## 2023-07-21 DIAGNOSIS — M25559 Pain in unspecified hip: Secondary | ICD-10-CM | POA: Diagnosis not present

## 2023-07-29 DIAGNOSIS — M545 Low back pain, unspecified: Secondary | ICD-10-CM | POA: Diagnosis not present

## 2023-07-29 DIAGNOSIS — M25559 Pain in unspecified hip: Secondary | ICD-10-CM | POA: Diagnosis not present

## 2023-07-29 DIAGNOSIS — R102 Pelvic and perineal pain: Secondary | ICD-10-CM | POA: Diagnosis not present

## 2023-08-04 DIAGNOSIS — M4316 Spondylolisthesis, lumbar region: Secondary | ICD-10-CM | POA: Diagnosis not present

## 2023-08-05 DIAGNOSIS — M545 Low back pain, unspecified: Secondary | ICD-10-CM | POA: Diagnosis not present

## 2023-08-05 DIAGNOSIS — M25559 Pain in unspecified hip: Secondary | ICD-10-CM | POA: Diagnosis not present

## 2023-08-05 DIAGNOSIS — R102 Pelvic and perineal pain: Secondary | ICD-10-CM | POA: Diagnosis not present

## 2023-08-11 DIAGNOSIS — R102 Pelvic and perineal pain: Secondary | ICD-10-CM | POA: Diagnosis not present

## 2023-08-11 DIAGNOSIS — M545 Low back pain, unspecified: Secondary | ICD-10-CM | POA: Diagnosis not present

## 2023-08-11 DIAGNOSIS — M25559 Pain in unspecified hip: Secondary | ICD-10-CM | POA: Diagnosis not present

## 2023-08-17 DIAGNOSIS — M25559 Pain in unspecified hip: Secondary | ICD-10-CM | POA: Diagnosis not present

## 2023-08-17 DIAGNOSIS — R102 Pelvic and perineal pain: Secondary | ICD-10-CM | POA: Diagnosis not present

## 2023-08-17 DIAGNOSIS — M545 Low back pain, unspecified: Secondary | ICD-10-CM | POA: Diagnosis not present

## 2023-08-20 DIAGNOSIS — Z1231 Encounter for screening mammogram for malignant neoplasm of breast: Secondary | ICD-10-CM | POA: Diagnosis not present

## 2023-08-20 DIAGNOSIS — Z7982 Long term (current) use of aspirin: Secondary | ICD-10-CM | POA: Diagnosis not present

## 2023-08-20 DIAGNOSIS — Z8262 Family history of osteoporosis: Secondary | ICD-10-CM | POA: Diagnosis not present

## 2023-08-20 DIAGNOSIS — M8588 Other specified disorders of bone density and structure, other site: Secondary | ICD-10-CM | POA: Diagnosis not present

## 2023-08-27 DIAGNOSIS — M8589 Other specified disorders of bone density and structure, multiple sites: Secondary | ICD-10-CM | POA: Diagnosis not present

## 2023-09-02 DIAGNOSIS — M25559 Pain in unspecified hip: Secondary | ICD-10-CM | POA: Diagnosis not present

## 2023-09-02 DIAGNOSIS — M545 Low back pain, unspecified: Secondary | ICD-10-CM | POA: Diagnosis not present

## 2023-09-02 DIAGNOSIS — R102 Pelvic and perineal pain: Secondary | ICD-10-CM | POA: Diagnosis not present

## 2023-09-09 DIAGNOSIS — M25559 Pain in unspecified hip: Secondary | ICD-10-CM | POA: Diagnosis not present

## 2023-09-09 DIAGNOSIS — R102 Pelvic and perineal pain: Secondary | ICD-10-CM | POA: Diagnosis not present

## 2023-09-09 DIAGNOSIS — M545 Low back pain, unspecified: Secondary | ICD-10-CM | POA: Diagnosis not present

## 2023-09-16 DIAGNOSIS — R102 Pelvic and perineal pain: Secondary | ICD-10-CM | POA: Diagnosis not present

## 2023-09-16 DIAGNOSIS — M545 Low back pain, unspecified: Secondary | ICD-10-CM | POA: Diagnosis not present

## 2023-09-16 DIAGNOSIS — M25559 Pain in unspecified hip: Secondary | ICD-10-CM | POA: Diagnosis not present

## 2023-09-20 DIAGNOSIS — M545 Low back pain, unspecified: Secondary | ICD-10-CM | POA: Diagnosis not present

## 2023-09-20 DIAGNOSIS — R102 Pelvic and perineal pain: Secondary | ICD-10-CM | POA: Diagnosis not present

## 2023-09-20 DIAGNOSIS — M25559 Pain in unspecified hip: Secondary | ICD-10-CM | POA: Diagnosis not present

## 2023-09-28 DIAGNOSIS — M25559 Pain in unspecified hip: Secondary | ICD-10-CM | POA: Diagnosis not present

## 2023-09-28 DIAGNOSIS — R102 Pelvic and perineal pain: Secondary | ICD-10-CM | POA: Diagnosis not present

## 2023-09-28 DIAGNOSIS — M545 Low back pain, unspecified: Secondary | ICD-10-CM | POA: Diagnosis not present

## 2023-10-04 DIAGNOSIS — R102 Pelvic and perineal pain: Secondary | ICD-10-CM | POA: Diagnosis not present

## 2023-10-04 DIAGNOSIS — M25559 Pain in unspecified hip: Secondary | ICD-10-CM | POA: Diagnosis not present

## 2023-10-04 DIAGNOSIS — M545 Low back pain, unspecified: Secondary | ICD-10-CM | POA: Diagnosis not present

## 2023-10-13 DIAGNOSIS — M25559 Pain in unspecified hip: Secondary | ICD-10-CM | POA: Diagnosis not present

## 2023-10-13 DIAGNOSIS — R102 Pelvic and perineal pain: Secondary | ICD-10-CM | POA: Diagnosis not present

## 2023-10-13 DIAGNOSIS — M545 Low back pain, unspecified: Secondary | ICD-10-CM | POA: Diagnosis not present

## 2023-10-20 DIAGNOSIS — M545 Low back pain, unspecified: Secondary | ICD-10-CM | POA: Diagnosis not present

## 2023-10-20 DIAGNOSIS — M25559 Pain in unspecified hip: Secondary | ICD-10-CM | POA: Diagnosis not present

## 2023-10-20 DIAGNOSIS — R102 Pelvic and perineal pain: Secondary | ICD-10-CM | POA: Diagnosis not present

## 2023-10-23 DIAGNOSIS — M25559 Pain in unspecified hip: Secondary | ICD-10-CM | POA: Diagnosis not present

## 2023-10-23 DIAGNOSIS — R102 Pelvic and perineal pain: Secondary | ICD-10-CM | POA: Diagnosis not present

## 2023-10-23 DIAGNOSIS — M545 Low back pain, unspecified: Secondary | ICD-10-CM | POA: Diagnosis not present

## 2023-10-26 DIAGNOSIS — M545 Low back pain, unspecified: Secondary | ICD-10-CM | POA: Diagnosis not present

## 2023-10-26 DIAGNOSIS — M25559 Pain in unspecified hip: Secondary | ICD-10-CM | POA: Diagnosis not present

## 2023-10-26 DIAGNOSIS — R102 Pelvic and perineal pain: Secondary | ICD-10-CM | POA: Diagnosis not present

## 2023-11-01 DIAGNOSIS — M25559 Pain in unspecified hip: Secondary | ICD-10-CM | POA: Diagnosis not present

## 2023-11-01 DIAGNOSIS — R102 Pelvic and perineal pain: Secondary | ICD-10-CM | POA: Diagnosis not present

## 2023-11-01 DIAGNOSIS — M545 Low back pain, unspecified: Secondary | ICD-10-CM | POA: Diagnosis not present

## 2023-11-18 DIAGNOSIS — M545 Low back pain, unspecified: Secondary | ICD-10-CM | POA: Diagnosis not present

## 2023-11-18 DIAGNOSIS — M25559 Pain in unspecified hip: Secondary | ICD-10-CM | POA: Diagnosis not present

## 2023-11-18 DIAGNOSIS — R102 Pelvic and perineal pain: Secondary | ICD-10-CM | POA: Diagnosis not present

## 2023-11-23 DIAGNOSIS — M545 Low back pain, unspecified: Secondary | ICD-10-CM | POA: Diagnosis not present

## 2023-11-23 DIAGNOSIS — R102 Pelvic and perineal pain: Secondary | ICD-10-CM | POA: Diagnosis not present

## 2023-11-23 DIAGNOSIS — M25559 Pain in unspecified hip: Secondary | ICD-10-CM | POA: Diagnosis not present

## 2023-11-24 DIAGNOSIS — Z961 Presence of intraocular lens: Secondary | ICD-10-CM | POA: Diagnosis not present

## 2023-11-24 DIAGNOSIS — H353132 Nonexudative age-related macular degeneration, bilateral, intermediate dry stage: Secondary | ICD-10-CM | POA: Diagnosis not present

## 2023-11-24 DIAGNOSIS — H43813 Vitreous degeneration, bilateral: Secondary | ICD-10-CM | POA: Diagnosis not present

## 2023-11-27 DIAGNOSIS — M545 Low back pain, unspecified: Secondary | ICD-10-CM | POA: Diagnosis not present

## 2023-11-27 DIAGNOSIS — R102 Pelvic and perineal pain: Secondary | ICD-10-CM | POA: Diagnosis not present

## 2023-11-27 DIAGNOSIS — M25559 Pain in unspecified hip: Secondary | ICD-10-CM | POA: Diagnosis not present

## 2023-12-02 DIAGNOSIS — R102 Pelvic and perineal pain: Secondary | ICD-10-CM | POA: Diagnosis not present

## 2023-12-02 DIAGNOSIS — M545 Low back pain, unspecified: Secondary | ICD-10-CM | POA: Diagnosis not present

## 2023-12-02 DIAGNOSIS — M25559 Pain in unspecified hip: Secondary | ICD-10-CM | POA: Diagnosis not present

## 2023-12-06 DIAGNOSIS — M25559 Pain in unspecified hip: Secondary | ICD-10-CM | POA: Diagnosis not present

## 2023-12-06 DIAGNOSIS — M545 Low back pain, unspecified: Secondary | ICD-10-CM | POA: Diagnosis not present

## 2023-12-06 DIAGNOSIS — R102 Pelvic and perineal pain: Secondary | ICD-10-CM | POA: Diagnosis not present

## 2023-12-13 DIAGNOSIS — M25559 Pain in unspecified hip: Secondary | ICD-10-CM | POA: Diagnosis not present

## 2023-12-13 DIAGNOSIS — R102 Pelvic and perineal pain: Secondary | ICD-10-CM | POA: Diagnosis not present

## 2023-12-13 DIAGNOSIS — M545 Low back pain, unspecified: Secondary | ICD-10-CM | POA: Diagnosis not present

## 2023-12-21 DIAGNOSIS — M545 Low back pain, unspecified: Secondary | ICD-10-CM | POA: Diagnosis not present

## 2023-12-21 DIAGNOSIS — M25559 Pain in unspecified hip: Secondary | ICD-10-CM | POA: Diagnosis not present

## 2023-12-21 DIAGNOSIS — R102 Pelvic and perineal pain: Secondary | ICD-10-CM | POA: Diagnosis not present

## 2023-12-30 DIAGNOSIS — M545 Low back pain, unspecified: Secondary | ICD-10-CM | POA: Diagnosis not present

## 2023-12-30 DIAGNOSIS — M25559 Pain in unspecified hip: Secondary | ICD-10-CM | POA: Diagnosis not present

## 2023-12-30 DIAGNOSIS — R102 Pelvic and perineal pain: Secondary | ICD-10-CM | POA: Diagnosis not present

## 2024-01-04 DIAGNOSIS — R102 Pelvic and perineal pain: Secondary | ICD-10-CM | POA: Diagnosis not present

## 2024-01-04 DIAGNOSIS — M545 Low back pain, unspecified: Secondary | ICD-10-CM | POA: Diagnosis not present

## 2024-01-04 DIAGNOSIS — M25559 Pain in unspecified hip: Secondary | ICD-10-CM | POA: Diagnosis not present

## 2024-01-12 DIAGNOSIS — R102 Pelvic and perineal pain: Secondary | ICD-10-CM | POA: Diagnosis not present

## 2024-01-12 DIAGNOSIS — M25559 Pain in unspecified hip: Secondary | ICD-10-CM | POA: Diagnosis not present

## 2024-01-12 DIAGNOSIS — M545 Low back pain, unspecified: Secondary | ICD-10-CM | POA: Diagnosis not present

## 2024-01-20 DIAGNOSIS — M25559 Pain in unspecified hip: Secondary | ICD-10-CM | POA: Diagnosis not present

## 2024-01-20 DIAGNOSIS — R102 Pelvic and perineal pain: Secondary | ICD-10-CM | POA: Diagnosis not present

## 2024-01-20 DIAGNOSIS — M545 Low back pain, unspecified: Secondary | ICD-10-CM | POA: Diagnosis not present

## 2024-01-25 DIAGNOSIS — M545 Low back pain, unspecified: Secondary | ICD-10-CM | POA: Diagnosis not present

## 2024-01-25 DIAGNOSIS — M25559 Pain in unspecified hip: Secondary | ICD-10-CM | POA: Diagnosis not present

## 2024-01-25 DIAGNOSIS — R102 Pelvic and perineal pain: Secondary | ICD-10-CM | POA: Diagnosis not present

## 2024-02-02 DIAGNOSIS — M4316 Spondylolisthesis, lumbar region: Secondary | ICD-10-CM | POA: Diagnosis not present

## 2024-02-03 DIAGNOSIS — R102 Pelvic and perineal pain unspecified side: Secondary | ICD-10-CM | POA: Diagnosis not present

## 2024-02-03 DIAGNOSIS — M25559 Pain in unspecified hip: Secondary | ICD-10-CM | POA: Diagnosis not present

## 2024-02-03 DIAGNOSIS — M545 Low back pain, unspecified: Secondary | ICD-10-CM | POA: Diagnosis not present

## 2024-02-07 DIAGNOSIS — M545 Low back pain, unspecified: Secondary | ICD-10-CM | POA: Diagnosis not present

## 2024-02-07 DIAGNOSIS — R102 Pelvic and perineal pain unspecified side: Secondary | ICD-10-CM | POA: Diagnosis not present

## 2024-02-07 DIAGNOSIS — M25559 Pain in unspecified hip: Secondary | ICD-10-CM | POA: Diagnosis not present

## 2024-02-15 DIAGNOSIS — M25559 Pain in unspecified hip: Secondary | ICD-10-CM | POA: Diagnosis not present

## 2024-02-15 DIAGNOSIS — R102 Pelvic and perineal pain unspecified side: Secondary | ICD-10-CM | POA: Diagnosis not present

## 2024-02-15 DIAGNOSIS — M545 Low back pain, unspecified: Secondary | ICD-10-CM | POA: Diagnosis not present

## 2024-03-01 DIAGNOSIS — M25559 Pain in unspecified hip: Secondary | ICD-10-CM | POA: Diagnosis not present

## 2024-03-01 DIAGNOSIS — M545 Low back pain, unspecified: Secondary | ICD-10-CM | POA: Diagnosis not present

## 2024-03-01 DIAGNOSIS — R102 Pelvic and perineal pain unspecified side: Secondary | ICD-10-CM | POA: Diagnosis not present

## 2024-03-07 DIAGNOSIS — M25559 Pain in unspecified hip: Secondary | ICD-10-CM | POA: Diagnosis not present

## 2024-03-07 DIAGNOSIS — R102 Pelvic and perineal pain unspecified side: Secondary | ICD-10-CM | POA: Diagnosis not present

## 2024-03-07 DIAGNOSIS — M545 Low back pain, unspecified: Secondary | ICD-10-CM | POA: Diagnosis not present

## 2024-03-14 DIAGNOSIS — M25559 Pain in unspecified hip: Secondary | ICD-10-CM | POA: Diagnosis not present

## 2024-03-14 DIAGNOSIS — M545 Low back pain, unspecified: Secondary | ICD-10-CM | POA: Diagnosis not present

## 2024-03-14 DIAGNOSIS — R102 Pelvic and perineal pain unspecified side: Secondary | ICD-10-CM | POA: Diagnosis not present

## 2024-03-22 DIAGNOSIS — M25559 Pain in unspecified hip: Secondary | ICD-10-CM | POA: Diagnosis not present

## 2024-03-22 DIAGNOSIS — M545 Low back pain, unspecified: Secondary | ICD-10-CM | POA: Diagnosis not present

## 2024-03-22 DIAGNOSIS — R102 Pelvic and perineal pain unspecified side: Secondary | ICD-10-CM | POA: Diagnosis not present

## 2024-03-30 DIAGNOSIS — R102 Pelvic and perineal pain unspecified side: Secondary | ICD-10-CM | POA: Diagnosis not present

## 2024-03-30 DIAGNOSIS — M25559 Pain in unspecified hip: Secondary | ICD-10-CM | POA: Diagnosis not present

## 2024-03-30 DIAGNOSIS — M545 Low back pain, unspecified: Secondary | ICD-10-CM | POA: Diagnosis not present

## 2024-04-05 DIAGNOSIS — E559 Vitamin D deficiency, unspecified: Secondary | ICD-10-CM | POA: Diagnosis not present

## 2024-04-05 DIAGNOSIS — Z1331 Encounter for screening for depression: Secondary | ICD-10-CM | POA: Diagnosis not present

## 2024-04-05 DIAGNOSIS — E7800 Pure hypercholesterolemia, unspecified: Secondary | ICD-10-CM | POA: Diagnosis not present

## 2024-04-05 DIAGNOSIS — Z0000 Encounter for general adult medical examination without abnormal findings: Secondary | ICD-10-CM | POA: Diagnosis not present

## 2024-04-05 DIAGNOSIS — I10 Essential (primary) hypertension: Secondary | ICD-10-CM | POA: Diagnosis not present
